# Patient Record
Sex: Female | Born: 1935 | Race: White | Hispanic: No | Smoking: Former smoker
Health system: Southern US, Community
[De-identification: ages and names within clinical notes are randomized; demographics above are authoritative.]

## PROBLEM LIST (undated history)

## (undated) DIAGNOSIS — H269 Unspecified cataract: Secondary | ICD-10-CM

## (undated) DIAGNOSIS — K219 Gastro-esophageal reflux disease without esophagitis: Secondary | ICD-10-CM

## (undated) DIAGNOSIS — C801 Malignant (primary) neoplasm, unspecified: Secondary | ICD-10-CM

## (undated) DIAGNOSIS — E119 Type 2 diabetes mellitus without complications: Secondary | ICD-10-CM

## (undated) DIAGNOSIS — T7840XA Allergy, unspecified, initial encounter: Secondary | ICD-10-CM

## (undated) DIAGNOSIS — R17 Unspecified jaundice: Secondary | ICD-10-CM

## (undated) DIAGNOSIS — I8390 Asymptomatic varicose veins of unspecified lower extremity: Secondary | ICD-10-CM

## (undated) DIAGNOSIS — H3530 Unspecified macular degeneration: Secondary | ICD-10-CM

## (undated) DIAGNOSIS — E7800 Pure hypercholesterolemia, unspecified: Secondary | ICD-10-CM

## (undated) HISTORY — DX: Unspecified jaundice: R17

## (undated) HISTORY — DX: Asymptomatic varicose veins of unspecified lower extremity: I83.90

## (undated) HISTORY — DX: Unspecified macular degeneration: H35.30

## (undated) HISTORY — DX: Gastro-esophageal reflux disease without esophagitis: K21.9

## (undated) HISTORY — PX: TUBAL LIGATION: SHX77

## (undated) HISTORY — DX: Unspecified cataract: H26.9

## (undated) HISTORY — DX: Allergy, unspecified, initial encounter: T78.40XA

## (undated) HISTORY — DX: Type 2 diabetes mellitus without complications: E11.9

---

## 1998-07-08 ENCOUNTER — Emergency Department (HOSPITAL_COMMUNITY): Admission: EM | Admit: 1998-07-08 | Discharge: 1998-07-08 | Payer: Self-pay

## 2015-03-27 ENCOUNTER — Emergency Department (HOSPITAL_COMMUNITY)
Admission: EM | Admit: 2015-03-27 | Discharge: 2015-03-27 | Disposition: A | Discharge status: Home / Self Care | Payer: Medicare HMO | Attending: Emergency Medicine | Admitting: Emergency Medicine

## 2015-03-27 ENCOUNTER — Encounter (HOSPITAL_COMMUNITY): Payer: Self-pay | Admitting: Emergency Medicine

## 2015-03-27 DIAGNOSIS — R630 Anorexia: Secondary | ICD-10-CM | POA: Diagnosis not present

## 2015-03-27 DIAGNOSIS — Z87891 Personal history of nicotine dependence: Secondary | ICD-10-CM | POA: Insufficient documentation

## 2015-03-27 DIAGNOSIS — L03115 Cellulitis of right lower limb: Secondary | ICD-10-CM | POA: Diagnosis not present

## 2015-03-27 DIAGNOSIS — R1031 Right lower quadrant pain: Secondary | ICD-10-CM | POA: Diagnosis present

## 2015-03-27 DIAGNOSIS — R3915 Urgency of urination: Secondary | ICD-10-CM | POA: Insufficient documentation

## 2015-03-27 DIAGNOSIS — L02415 Cutaneous abscess of right lower limb: Secondary | ICD-10-CM | POA: Diagnosis not present

## 2015-03-27 DIAGNOSIS — Z79899 Other long term (current) drug therapy: Secondary | ICD-10-CM | POA: Insufficient documentation

## 2015-03-27 DIAGNOSIS — L0390 Cellulitis, unspecified: Secondary | ICD-10-CM

## 2015-03-27 DIAGNOSIS — L0291 Cutaneous abscess, unspecified: Secondary | ICD-10-CM

## 2015-03-27 LAB — URINALYSIS, ROUTINE W REFLEX MICROSCOPIC
Bilirubin Urine: NEGATIVE
Glucose, UA: >1000 mg/dL — AB
Ketones, ur: NEGATIVE mg/dL
LEUKOCYTES UA: NEGATIVE
Nitrite: NEGATIVE
PROTEIN: NEGATIVE mg/dL
SPECIFIC GRAVITY, URINE: 1.035 — AB (ref 1.005–1.030)
UROBILINOGEN UA: 0.2 mg/dL (ref 0.0–1.0)
pH: 5 (ref 5.0–8.0)

## 2015-03-27 LAB — URINE MICROSCOPIC-ADD ON

## 2015-03-27 LAB — CBG MONITORING, ED: GLUCOSE-CAPILLARY: 239 mg/dL — AB (ref 65–99)

## 2015-03-27 MED ORDER — LIDOCAINE HCL (PF) 1 % IJ SOLN
10.0000 mL | Freq: Once | INTRAMUSCULAR | Status: AC
  Administered 2015-03-27: 10 mL via INTRADERMAL
  Filled 2015-03-27: qty 10

## 2015-03-27 MED ORDER — CEPHALEXIN 500 MG PO CAPS: 500.0000 mg | ORAL_CAPSULE | Freq: Two times a day (BID) | ORAL | Status: DC

## 2015-03-27 MED ORDER — ACETAMINOPHEN 500 MG PO TABS
1000.0000 mg | ORAL_TABLET | Freq: Once | ORAL | Status: DC
  Filled 2015-03-27: qty 2

## 2015-03-27 MED ORDER — OXYCODONE HCL 5 MG PO TABS
2.5000 mg | ORAL_TABLET | Freq: Once | ORAL | Status: AC
  Administered 2015-03-27: 2.5 mg via ORAL
  Filled 2015-03-27: qty 1

## 2015-03-27 NOTE — ED Notes (Addendum)
Patient states she has right groin pain.  Denies any injury or falls.  She states she hasn't done any exercise or activity to have caused this pain. She states a knot has appeared 2 days ago. She has trouble moving about and walking.  Patient states she has been having chills.

## 2015-03-27 NOTE — ED Notes (Signed)
Awake. Verbally responsive. A/O x4. Resp even and unlabored. No audible adventitious breath sounds noted. ABC's intact.  

## 2015-03-27 NOTE — Discharge Instructions (Signed)
1. Medications: keflex, usual home medications 2. Treatment: rest, drink plenty of fluids  3. Follow Up: please followup with your primary doctor for discussion of your diagnoses and further evaluation after today's visit; if you do not have a primary care doctor use the resource guide provided to find one; please return to the ER for severe pain, persistent bleeding from incision and drainage site, fever, chills, new or worsening symptoms   Abscess An abscess is an infected area that contains a collection of pus and debris.It can occur in almost any part of the body. An abscess is also known as a furuncle or boil. CAUSES  An abscess occurs when tissue gets infected. This can occur from blockage of oil or sweat glands, infection of hair follicles, or a minor injury to the skin. As the body tries to fight the infection, pus collects in the area and creates pressure under the skin. This pressure causes pain. People with weakened immune systems have difficulty fighting infections and get certain abscesses more often.  SYMPTOMS Usually an abscess develops on the skin and becomes a painful mass that is red, warm, and tender. If the abscess forms under the skin, you may feel a moveable soft area under the skin. Some abscesses break open (rupture) on their own, but most will continue to get worse without care. The infection can spread deeper into the body and eventually into the bloodstream, causing you to feel ill.  DIAGNOSIS  Your caregiver will take your medical history and perform a physical exam. A sample of fluid may also be taken from the abscess to determine what is causing your infection. TREATMENT  Your caregiver may prescribe antibiotic medicines to fight the infection. However, taking antibiotics alone usually does not cure an abscess. Your caregiver may need to make a small cut (incision) in the abscess to drain the pus. In some cases, gauze is packed into the abscess to reduce pain and to  continue draining the area. HOME CARE INSTRUCTIONS   Only take over-the-counter or prescription medicines for pain, discomfort, or fever as directed by your caregiver.  If you were prescribed antibiotics, take them as directed. Finish them even if you start to feel better.  If gauze is used, follow your caregiver's directions for changing the gauze.  To avoid spreading the infection:  Keep your draining abscess covered with a bandage.  Wash your hands well.  Do not share personal care items, towels, or whirlpools with others.  Avoid skin contact with others.  Keep your skin and clothes clean around the abscess.  Keep all follow-up appointments as directed by your caregiver. SEEK MEDICAL CARE IF:   You have increased pain, swelling, redness, fluid drainage, or bleeding.  You have muscle aches, chills, or a general ill feeling.  You have a fever. MAKE SURE YOU:   Understand these instructions.  Will watch your condition.  Will get help right away if you are not doing well or get worse. Document Released: 04/16/2005 Document Revised: 01/06/2012 Document Reviewed: 09/19/2011 [facility] Patient Information 2015 [place], [place]. This information is not intended to replace advice given to you by your health care provider. Make sure you discuss any questions you have with your health care provider.  Cellulitis Cellulitis is an infection of the skin and the tissue beneath it. The infected area is usually red and tender. Cellulitis occurs most often in the arms and lower legs.  CAUSES  Cellulitis is caused by bacteria that enter the skin through cracks  or cuts in the skin. The most common types of bacteria that cause cellulitis are staphylococci and streptococci. SIGNS AND SYMPTOMS   Redness and warmth.  Swelling.  Tenderness or pain.  Fever. DIAGNOSIS  Your health care provider can usually determine what is wrong based on a physical exam. Blood tests may also be  done. TREATMENT  Treatment usually involves taking an antibiotic medicine. HOME CARE INSTRUCTIONS   Take your antibiotic medicine as directed by your health care provider. Finish the antibiotic even if you start to feel better.  Keep the infected arm or leg elevated to reduce swelling.  Apply a warm cloth to the affected area up to 4 times per day to relieve pain.  Take medicines only as directed by your health care provider.  Keep all follow-up visits as directed by your health care provider. SEEK MEDICAL CARE IF:   You notice red streaks coming from the infected area.  Your red area gets larger or turns dark in color.  Your bone or joint underneath the infected area becomes painful after the skin has healed.  Your infection returns in the same area or another area.  You notice a swollen bump in the infected area.  You develop new symptoms.  You have a fever. SEEK IMMEDIATE MEDICAL CARE IF:   You feel very sleepy.  You develop vomiting or diarrhea.  You have a general ill feeling (malaise) with muscle aches and pains. MAKE SURE YOU:   Understand these instructions.  Will watch your condition.  Will get help right away if you are not doing well or get worse. Document Released: 04/16/2005 Document Revised: 11/21/2013 Document Reviewed: 09/22/2011 [facility] Patient Information 2015 [place], [place]. This information is not intended to replace advice given to you by your health care provider. Make sure you discuss any questions you have with your health care provider.   Emergency Department Resource Guide 1) Find a Doctor and Pay Out of Pocket Although you won't have to find out who is covered by your insurance plan, it is a good idea to ask around and get recommendations. You will then need to call the office and see if the doctor you have chosen will accept you as a new patient and what types of options they offer for patients who are self-pay. Some doctors offer  discounts or will set up payment plans for their patients who do not have insurance, but you will need to ask so you aren't surprised when you get to your appointment.  2) Contact Your Local Health Department Not all health departments have doctors that can see patients for sick visits, but many do, so it is worth a call to see if yours does. If you don't know where your local health department is, you can check in your phone book. The CDC also has a tool to help you locate your state's health department, and many state websites also have listings of all of their local health departments.  3) Find a [facility] If your illness is not likely to be very severe or complicated, you may want to try a walk in clinic. These are popping up all over the country in pharmacies, drugstores, and shopping centers. They're usually staffed by nurse practitioners or physician assistants that have been trained to treat common illnesses and complaints. They're usually fairly quick and inexpensive. However, if you have serious medical issues or chronic medical problems, these are probably not your best option.  No Primary Care Doctor: - Call  Health Connect at  [phone] - they can help you locate a primary care doctor that  accepts your insurance, provides certain services, etc. - Physician Referral Service- [phone]  Chronic Pain Problems: Organization         Address  Phone   Notes  [facility]  [phone] Patients need to be referred by their primary care doctor.   Medication Assistance: Organization         Address  Phone   Notes  [facility] Medication [facility] [place] [address] [place], [place] [address] [phone] --Must be a resident of [facility] -- Must have NO insurance coverage whatsoever (no Medicaid/ Medicare, etc.) -- The pt. MUST have a primary care doctor that directs their care regularly and follows them in the community   MedAssist   [phone]   [employer]  [phone]    Agencies that provide inexpensive medical care: Organization         Address  Phone   Notes  [place]  [phone]   [facility]    [phone]   [facility] [place], [place] [address] [phone]   [place] [address] [place] [phone]   Planned Parenthood    [phone]   [facility]    [phone]   [place] and [place] [address], [place] Phone:  [phone], Fax:  [phone] Hours of Operation:  9 am - 6 pm, M-F.  Also accepts Medicaid/Medicare and self-pay.  [facility] for [place], [address], [place] Phone: [phone], Fax: [phone]. Hours of Operation:  8:30 am - 5:30 pm, M-F.  Also accepts Medicaid and self-pay.  [facility] [place] [address] [place] Phone: [phone]   [place], [place], [place] [phone], Ext. [phone] Mondays & Thursdays: 7-9 AM.  First 15 patients are seen on a first come, first serve basis.    [place] Providers:  Organization         Address  Phone   Notes  [facility] [address], [place] [phone] Also accepts self-pay patients.  [facility] [address], [place]  [phone]   [place], [address], [place] [phone]   [facility] [address], [place] [phone]   Lucianne Lei [address], [place]   [phone] Only accepts [place] Access [place] patients after they have their name applied to their card.   Self-Pay (no insurance) in [facility]:  Organization         Address  Phone   Notes  Sickle Cell Patients, [facility] [place] [phone]   [facility] [place] [phone]   [facility] [place]  [place], [place], [place] [phone]   [facility]/Dr. Osei-Bonsu  [address], [place] or [address], [place] [phone] Phone number for both [place] and [place] locations is the same.  [facility] [address] [place] [phone]   [place]  [address], [place] or [address] [phone]   [facility] [place] [phone], phone; [phone], fax Sees patients 1st and 3rd Saturday of every month.  Must not qualify for public or private insurance (i.e. Medicaid, Medicare, [place] Health Choice, Veterans' Benefits)  Household income should be no more than 200% of the poverty level The clinic cannot treat you if you are pregnant or think you are pregnant  Sexually transmitted diseases are not treated at the clinic.    Dental Care: Organization         Address  Phone  Notes  [facility] of [facility] [place] [phone] Accepts children up to age 36 who are enrolled in [place] or [place]; pregnant women with a Medicaid card; and children who have applied for Medicaid or [place] Health Choice, but were declined, whose parents can pay a reduced fee at time of service.  [facility] Department of [facility]  [address], [place] [phone] Accepts children up to age 69 who are enrolled in [place] or [place]; pregnant women with a Medicaid card; and children who have applied for Medicaid or [place] Health Choice, but were declined, whose parents can pay a reduced fee at time of service.  [place] Adult Dental Access PROGRAM  [place]  [phone] Patients are seen by appointment only. Walk-ins are not accepted. [place] will see patients 84 years of age and older. Monday - Tuesday (8am-5pm) Most Wednesdays (8:30-5pm) $30 per visit, cash only  [facility] Adult Dental Access PROGRAM  [address], [facility] [phone] Patients are seen by appointment only. Walk-ins are not accepted. [place] will see patients 55 years of age and older. One Wednesday Evening (Monthly: Volunteer Based).  $30 per visit, cash only  [place]  [phone] for adults; Children under age 60, call Graduate Pediatric Dentistry at [phone]. Children aged 30-14, please call [phone] to request a pediatric application.  Dental services are provided in all areas of dental care including fillings, crowns and bridges, complete and partial dentures, implants, gum treatment, root canals, and extractions. Preventive care is also provided. Treatment is provided to both adults and children. Patients are selected via a lottery and there is often a waiting list.   [facility] [address] [place]  [phone] [email or web address]   [facility] [address] [place], [place] [phone], Ext. [phone] Second and Fourth Thursday of each month, opens at 6:30 AM; Clinic ends at 9 AM.  Patients are seen on a first-come first-served basis, and a limited number are seen during each clinic.   [facility]  [address] [place] Danker [place], [place] [phone]   Eligibility Requirements You must have lived in [place], [place], or [place] counties for at least the last three months.   You cannot be eligible for state or federal sponsored [employer], including [employer], [place], or [employer].   You generally cannot be eligible for healthcare insurance through your employer.    How to apply: Eligibility screenings are held every Tuesday and Wednesday  afternoon from 1:00 pm until 4:00 pm. You do not need an appointment for the interview!  [facility] [address] [place], [place]   [place]  [place] Department  [place]  [phone]    Behavioral Health Resources in the Community: Intensive Outpatient Programs Organization         Address  Phone  Notes  [place]. [address], [place], [place] [phone]   [facility]patient [address] [place], [place]   ADS: Alcohol & Drug Sv[facility] [address] [place], [place]   [place] [address]  [place], [place] or [phone]   Substance Abuse Resources Organization         Address  Phone  Notes  Alcohol and Drug Services  [phone]   [place]  [phone]   [facility] [place]   Chinita Pester  [phone]   Residential & Outpatient Substance Abuse Program  [phone]   Psychological Services Organization         Address  Phone  Notes  [facility] [place]  [place]  [phone]   [place] [address], [place] or [phone]    Mobile Crisis Teams Organization         Address  Phone  Notes  [facility], Mobile Crisis Care Unit  [phone]   [facility]  [address] [place], [place]   [place] [address], [place] [phone]    Self-Help/Support Groups Organization         Address  Phone             Notes  [place]. of [place] - variety of support groups  [place] Call for more information  Narcotics Anonymous (NA), [facility] [address], [employer] [place]  2 meetings at this location   Materials engineer         Address  Phone  Notes  [facility]idential [facility] [place],    [place]  [phone]   [facility]  [address] [place] [address], [place], [place]   [place], [place] [phone] Admissions: 8am-3pm M-F  Incentives Substance [place] [address]    [place], [place] [phone]   [facility] [address] [place], [place], [place]   [facility] [address],  [place], [place]   [facility]ight Programs - Intensive Outpatient [address], [place], [place]   [facility] ([place] [place].,  [place], [place] [phone] or [phone]   [facility] (RTS) [address], [place], [place] Accepts Medicaid  [facility] [place] [address]  [place] [phone] Substance Abuse/Addiction Treatment   [facility] Organization         Address  Phone  Notes  [facility]  [phone]   Domenic Schwab, PhD [address] Arlis [place], [place]   [phone] or [phone]   [facility] [place], [place] [phone]   [place] [address] [place], [place] [phone] Insurance/Medicaid/sponsorship through [employer] and Families [address], [address]  [place], [place] [phone] [place], [place] [phone]    Dr. Adele Schilder  [phone]   [facility] Dept. 1) [address] [place] 2) [place] 3)  [place], [place] [phone]  [phone]   [place] [phone] or [phone] (After Hours)

## 2015-03-27 NOTE — ED Provider Notes (Signed)
CSN: [id number]     Arrival date & time 03/27/15  1238 History   First MD Initiated Contact with Patient 03/27/15 1502     Chief Complaint  Patient presents with  . Groin Pain     HPI   Barbara Golden is a 79 y.o. female with no pertinent PMH who presents to the ED with right groin pain since Saturday. She states she noticed a "knot" in her right groin on Saturday, and reports worsening pain and swelling over the last several days. She states movement exacerbates her pain. She has not tried anything for symptom relief. Denies drainage. Reports subjective fever and chills. Denies headache, dizziness, syncope, chest pain, shortness of breath, abdominal pain, N/V/D/C, dysuria. Reports urgency. Denies vaginal discharge. States this has never happened to her before.   History reviewed. No pertinent past medical history. Past Surgical History  Procedure Laterality Date  . Tubal ligation Bilateral    No family history on file. Social History  Substance Use Topics  . Smoking status: Former Smoker -- 0.50 packs/day    Types: Cigarettes    Quit date: 03/26/2009  . Smokeless tobacco: Never Used  . Alcohol Use: No   OB History    No data available      Review of Systems  Constitutional: Positive for fever, chills and appetite change. Negative for activity change and fatigue.       Reports subjective fever, chills; reports decreased appetite.  HENT: Negative for congestion.   Eyes: Negative for visual disturbance.  Respiratory: Negative for cough and shortness of breath.   Cardiovascular: Negative for chest pain, palpitations and leg swelling.  Gastrointestinal: Negative for nausea, vomiting, abdominal pain, diarrhea, constipation and abdominal distention.  Genitourinary: Positive for urgency. Negative for dysuria and frequency.  Musculoskeletal: Negative for myalgias, back pain, arthralgias, neck pain and neck stiffness.  Skin: Negative for color change, pallor, rash and wound.   Neurological: Negative for dizziness, syncope, weakness, light-headedness, numbness and headaches.  All other systems reviewed and are negative.     Allergies  Review of patient's allergies indicates no known allergies.  Home Medications   Prior to Admission medications   Medication Sig Start Date End Date Taking? Authorizing Provider  Alum Hydroxide-Mag Carbonate (GAVISCON PO) Take 1 tablet by mouth daily as needed (heartburn).   Yes Historical Provider, MD  cetirizine (ZYRTEC) 10 MG tablet Take 10 mg by mouth daily as needed for allergies.   Yes Historical Provider, MD  Multiple Vitamins-Minerals (PRESERVISION AREDS PO) Take 2 capsules by mouth daily.   Yes Historical Provider, MD  ranitidine (ZANTAC) 150 MG tablet Take 150 mg by mouth daily as needed for heartburn.   Yes Historical Provider, MD    BP 109/71 mmHg  Pulse 99  Temp(Src) 98.4 F (36.9 C) (Oral)  Resp 16  SpO2 97% Physical Exam  Constitutional: She is oriented to person, place, and time. She appears well-developed and well-nourished. No distress.  HENT:  Head: Normocephalic and atraumatic.  Right Ear: External ear normal.  Left Ear: External ear normal.  Nose: Nose normal.  Mouth/Throat: Uvula is midline, oropharynx is clear and moist and mucous membranes are normal.  Eyes: Conjunctivae, EOM and lids are normal. Pupils are equal, round, and reactive to light. Right eye exhibits no discharge. Left eye exhibits no discharge. No scleral icterus.  Neck: Normal range of motion. Neck supple.  Cardiovascular: Normal rate, regular rhythm, normal heart sounds, intact distal pulses and normal pulses.   Pulmonary/Chest: Effort  normal and breath sounds normal. No respiratory distress. She has no wheezes. She has no rales. She exhibits no tenderness.  Abdominal: Soft. Normal appearance and bowel sounds are normal. She exhibits no distension and no mass. There is no tenderness. There is no rigidity, no rebound and no guarding.   Musculoskeletal: Normal range of motion. She exhibits no edema or tenderness.  Neurological: She is alert and oriented to person, place, and time. She has normal strength. No sensory deficit.  Skin: Skin is warm, dry and intact. No rash noted. She is not diaphoretic. There is erythema. No pallor.  1 cm fluctuant mass with surrounding induration, erythema, and warmth to medial aspect of right upper thigh.  Psychiatric: She has a normal mood and affect. Her speech is normal and behavior is normal. Judgment and thought content normal.  Nursing note and vitals reviewed.   ED Course  INCISION AND DRAINAGE Date/Time: 03/27/2015 4:39 PM Performed by: Bernerd Limbo C Authorized by: Bernerd Limbo C Consent: Verbal consent obtained. Risks and benefits: risks, benefits and alternatives were discussed Consent given by: patient Patient understanding: patient states understanding of the procedure being performed Patient consent: the patient's understanding of the procedure matches consent given Procedure consent: procedure consent matches procedure scheduled Relevant documents: relevant documents present and verified Site marked: the operative site was marked Imaging studies: imaging studies available Required items: required blood products, implants, devices, and special equipment available Patient identity confirmed: verbally with patient and arm band Time out: Immediately prior to procedure a "time out" was called to verify the correct patient, procedure, equipment, support staff and site/side marked as required. Type: abscess Body area: lower extremity Location details: right leg Anesthesia: local infiltration Local anesthetic: lidocaine 1% without epinephrine Anesthetic total: 8 ml Patient sedated: no Scalpel size: 11 Needle gauge: 22 Incision type: single straight Incision depth: dermal Complexity: simple Drainage: serosanguinous and  purulent Drainage amount:  moderate Wound treatment: wound left open Packing material: none Patient tolerance: Patient tolerated the procedure well with no immediate complications   (including critical care time)  Labs Review Labs Reviewed  URINALYSIS, ROUTINE W REFLEX MICROSCOPIC (NOT AT [facility]) - Abnormal; Notable for the following:    Specific Gravity, Urine 1.035 (*)    Glucose, UA >1000 (*)    Hgb urine dipstick TRACE (*)    All other components within normal limits  URINE MICROSCOPIC-ADD ON - Abnormal; Notable for the following:    Squamous Epithelial / LPF FEW (*)    All other components within normal limits  CBG MONITORING, ED - Abnormal; Notable for the following:    Glucose-Capillary 239 (*)    All other components within normal limits    Imaging Review No results found.   I have personally reviewed and evaluated these lab results as part of my medical decision-making.   EKG Interpretation None      MDM   Final diagnoses:  Abscess and cellulitis   79 year old female presents with abscess to medial aspect of right upper thigh since Saturday.  Reports subjective fever and chills. Denies headache, dizziness, syncope, chest pain, shortness of breath, abdominal pain, N/V/D/C, dysuria. Reports urgency. Denies vaginal discharge. States this has never happened to her before.  Patient is afebrile. Vital signs stable. 1 cm fluctuant mass with surrounding induration, erythema, and warmth to medial aspect of right upper thigh.   Will obtain UA given urgency as well as blood glucose to assess for possible DM. Patient reports she rarely goes to  the doctor and does not have a personal history of DM, but has a FH of DM. UA negative for infection, urine glucose >1000. CBG 239.  Pain controlled in the ED. I&D performed, will discharge patient with keflex. Patient to follow-up with PCP for wound re-check and for discussion of elevated blood glucose. Return precautions discussed.  BP 109/71 mmHg  Pulse 99   Temp(Src) 98.4 F (36.9 C) (Oral)  Resp 16  SpO2 97%      Marella Chimes, PA-C 03/28/15 Maud, DO 03/28/15 [phone]

## 2015-03-27 NOTE — ED Notes (Signed)
Pt states "it started hurting hurting Saturday.  I've been having fever and chills."

## 2017-03-05 DIAGNOSIS — R030 Elevated blood-pressure reading, without diagnosis of hypertension: Secondary | ICD-10-CM | POA: Diagnosis not present

## 2017-03-05 DIAGNOSIS — J069 Acute upper respiratory infection, unspecified: Secondary | ICD-10-CM | POA: Diagnosis not present

## 2017-03-05 DIAGNOSIS — R05 Cough: Secondary | ICD-10-CM | POA: Diagnosis not present

## 2018-05-10 DIAGNOSIS — H524 Presbyopia: Secondary | ICD-10-CM | POA: Diagnosis not present

## 2019-06-10 DIAGNOSIS — R69 Illness, unspecified: Secondary | ICD-10-CM | POA: Diagnosis not present

## 2019-06-15 ENCOUNTER — Other Ambulatory Visit: Payer: Self-pay

## 2019-06-15 DIAGNOSIS — Z20822 Contact with and (suspected) exposure to covid-19: Secondary | ICD-10-CM

## 2019-06-16 LAB — NOVEL CORONAVIRUS, NAA: SARS-CoV-2, NAA: NOT DETECTED

## 2019-06-21 DIAGNOSIS — J309 Allergic rhinitis, unspecified: Secondary | ICD-10-CM | POA: Diagnosis not present

## 2019-06-21 DIAGNOSIS — Z6826 Body mass index (BMI) 26.0-26.9, adult: Secondary | ICD-10-CM | POA: Diagnosis not present

## 2019-06-21 DIAGNOSIS — Z809 Family history of malignant neoplasm, unspecified: Secondary | ICD-10-CM | POA: Diagnosis not present

## 2019-06-21 DIAGNOSIS — Z7722 Contact with and (suspected) exposure to environmental tobacco smoke (acute) (chronic): Secondary | ICD-10-CM | POA: Diagnosis not present

## 2019-06-21 DIAGNOSIS — E663 Overweight: Secondary | ICD-10-CM | POA: Diagnosis not present

## 2019-06-21 DIAGNOSIS — G3184 Mild cognitive impairment, so stated: Secondary | ICD-10-CM | POA: Diagnosis not present

## 2019-06-21 DIAGNOSIS — K08109 Complete loss of teeth, unspecified cause, unspecified class: Secondary | ICD-10-CM | POA: Diagnosis not present

## 2019-06-21 DIAGNOSIS — H5360 Unspecified night blindness: Secondary | ICD-10-CM | POA: Diagnosis not present

## 2019-06-21 DIAGNOSIS — R269 Unspecified abnormalities of gait and mobility: Secondary | ICD-10-CM | POA: Diagnosis not present

## 2019-06-21 DIAGNOSIS — H3530 Unspecified macular degeneration: Secondary | ICD-10-CM | POA: Diagnosis not present

## 2019-09-13 ENCOUNTER — Emergency Department (HOSPITAL_COMMUNITY): Payer: Medicare HMO

## 2019-09-13 ENCOUNTER — Ambulatory Visit (INDEPENDENT_AMBULATORY_CARE_PROVIDER_SITE_OTHER)
Admission: EM | Admit: 2019-09-13 | Discharge: 2019-09-13 | Disposition: A | Discharge status: Home / Self Care | Payer: Medicare HMO | Source: Home / Self Care

## 2019-09-13 ENCOUNTER — Observation Stay (HOSPITAL_COMMUNITY)
Admission: EM | Admit: 2019-09-13 | Discharge: 2019-09-14 | Disposition: A | Discharge status: Home / Self Care | Payer: Medicare HMO | Attending: Internal Medicine | Admitting: Internal Medicine

## 2019-09-13 ENCOUNTER — Encounter (HOSPITAL_COMMUNITY): Payer: Self-pay | Admitting: Emergency Medicine

## 2019-09-13 ENCOUNTER — Other Ambulatory Visit: Payer: Self-pay

## 2019-09-13 ENCOUNTER — Encounter: Payer: Self-pay | Admitting: Emergency Medicine

## 2019-09-13 DIAGNOSIS — E860 Dehydration: Secondary | ICD-10-CM | POA: Insufficient documentation

## 2019-09-13 DIAGNOSIS — Z20822 Contact with and (suspected) exposure to covid-19: Secondary | ICD-10-CM | POA: Insufficient documentation

## 2019-09-13 DIAGNOSIS — E1165 Type 2 diabetes mellitus with hyperglycemia: Principal | ICD-10-CM | POA: Insufficient documentation

## 2019-09-13 DIAGNOSIS — R410 Disorientation, unspecified: Secondary | ICD-10-CM

## 2019-09-13 DIAGNOSIS — Z9119 Patient's noncompliance with other medical treatment and regimen: Secondary | ICD-10-CM

## 2019-09-13 DIAGNOSIS — J302 Other seasonal allergic rhinitis: Secondary | ICD-10-CM | POA: Insufficient documentation

## 2019-09-13 DIAGNOSIS — Z87891 Personal history of nicotine dependence: Secondary | ICD-10-CM | POA: Insufficient documentation

## 2019-09-13 DIAGNOSIS — R2681 Unsteadiness on feet: Secondary | ICD-10-CM | POA: Diagnosis not present

## 2019-09-13 DIAGNOSIS — Z885 Allergy status to narcotic agent status: Secondary | ICD-10-CM | POA: Diagnosis not present

## 2019-09-13 DIAGNOSIS — R5383 Other fatigue: Secondary | ICD-10-CM

## 2019-09-13 DIAGNOSIS — N390 Urinary tract infection, site not specified: Secondary | ICD-10-CM | POA: Diagnosis not present

## 2019-09-13 DIAGNOSIS — W19XXXA Unspecified fall, initial encounter: Secondary | ICD-10-CM | POA: Diagnosis not present

## 2019-09-13 DIAGNOSIS — R8271 Bacteriuria: Secondary | ICD-10-CM | POA: Insufficient documentation

## 2019-09-13 DIAGNOSIS — Z833 Family history of diabetes mellitus: Secondary | ICD-10-CM | POA: Insufficient documentation

## 2019-09-13 DIAGNOSIS — R531 Weakness: Secondary | ICD-10-CM | POA: Diagnosis not present

## 2019-09-13 DIAGNOSIS — R739 Hyperglycemia, unspecified: Secondary | ICD-10-CM | POA: Diagnosis not present

## 2019-09-13 DIAGNOSIS — R631 Polydipsia: Secondary | ICD-10-CM | POA: Diagnosis not present

## 2019-09-13 DIAGNOSIS — S0990XA Unspecified injury of head, initial encounter: Secondary | ICD-10-CM | POA: Insufficient documentation

## 2019-09-13 DIAGNOSIS — Z91199 Patient's noncompliance with other medical treatment and regimen due to unspecified reason: Secondary | ICD-10-CM

## 2019-09-13 DIAGNOSIS — R5381 Other malaise: Secondary | ICD-10-CM

## 2019-09-13 LAB — BASIC METABOLIC PANEL
Anion gap: 11 (ref 5–15)
BUN: 9 mg/dL (ref 8–23)
CO2: 27 mmol/L (ref 22–32)
Calcium: 9.2 mg/dL (ref 8.9–10.3)
Chloride: 101 mmol/L (ref 98–111)
Creatinine, Ser: 0.64 mg/dL (ref 0.44–1.00)
GFR calc Af Amer: >60 mL/min (ref >60)
GFR calc non Af Amer: >60 mL/min (ref >60)
Glucose, Bld: 306 mg/dL — ABNORMAL HIGH (ref 70–99)
Potassium: 4.2 mmol/L (ref 3.5–5.1)
Sodium: 139 mmol/L (ref 135–145)

## 2019-09-13 LAB — CBG MONITORING, ED
Glucose-Capillary: 269 mg/dL — ABNORMAL HIGH (ref 70–99)
Glucose-Capillary: 190 mg/dL — ABNORMAL HIGH (ref 70–99)
Glucose-Capillary: 234 mg/dL — ABNORMAL HIGH (ref 70–99)

## 2019-09-13 LAB — HEMOGLOBIN A1C
Hgb A1c MFr Bld: 13 % — ABNORMAL HIGH (ref 4.8–5.6)
Mean Plasma Glucose: 326.4 mg/dL

## 2019-09-13 LAB — URINALYSIS, ROUTINE W REFLEX MICROSCOPIC
Bilirubin Urine: NEGATIVE
Glucose, UA: >=500 mg/dL — AB
Hgb urine dipstick: NEGATIVE
Ketones, ur: 5 mg/dL — AB
Nitrite: NEGATIVE
Protein, ur: NEGATIVE mg/dL
Specific Gravity, Urine: 1.031 — ABNORMAL HIGH (ref 1.005–1.030)
WBC, UA: >50 WBC/hpf — ABNORMAL HIGH (ref 0–5)
pH: 5 (ref 5.0–8.0)

## 2019-09-13 LAB — CBC
HCT: 47.6 % — ABNORMAL HIGH (ref 36.0–46.0)
Hemoglobin: 15.3 g/dL — ABNORMAL HIGH (ref 12.0–15.0)
MCH: 30.4 pg (ref 26.0–34.0)
MCHC: 32.1 g/dL (ref 30.0–36.0)
MCV: 94.6 fL (ref 80.0–100.0)
Platelets: 372 10*3/uL (ref 150–400)
RBC: 5.03 MIL/uL (ref 3.87–5.11)
RDW: 12.5 % (ref 11.5–15.5)
WBC: 8.9 10*3/uL (ref 4.0–10.5)
nRBC: 0 % (ref 0.0–0.2)

## 2019-09-13 LAB — POCT FASTING CBG KUC MANUAL ENTRY: POCT Glucose (KUC): 315 mg/dL — AB (ref 70–99)

## 2019-09-13 MED ORDER — FAMOTIDINE 20 MG PO TABS: 10.0000 mg | ORAL_TABLET | Freq: Two times a day (BID) | ORAL | Status: DC | PRN

## 2019-09-13 MED ORDER — ONDANSETRON HCL 4 MG/2ML IJ SOLN
4.0000 mg | Freq: Once | INTRAMUSCULAR | Status: DC
  Filled 2019-09-13: qty 2

## 2019-09-13 MED ORDER — SODIUM CHLORIDE 0.9 % IV SOLN
1.0000 g | Freq: Once | INTRAVENOUS | Status: AC
  Administered 2019-09-13: 1 g via INTRAVENOUS
  Filled 2019-09-13: qty 10

## 2019-09-13 MED ORDER — LACTATED RINGERS IV SOLN: INTRAVENOUS | Status: AC

## 2019-09-13 MED ORDER — LORATADINE 10 MG PO TABS
10.0000 mg | ORAL_TABLET | Freq: Every day | ORAL | Status: DC
  Administered 2019-09-14: 10 mg via ORAL
  Filled 2019-09-13: qty 1

## 2019-09-13 MED ORDER — INSULIN ASPART 100 UNIT/ML ~~LOC~~ SOLN
0.0000 [IU] | Freq: Three times a day (TID) | SUBCUTANEOUS | Status: DC
  Administered 2019-09-14 (×2): 2 [IU] via SUBCUTANEOUS

## 2019-09-13 MED ORDER — ENOXAPARIN SODIUM 40 MG/0.4ML ~~LOC~~ SOLN: 40.0000 mg | SUBCUTANEOUS | Status: DC

## 2019-09-13 MED ORDER — SODIUM CHLORIDE 0.9 % IV BOLUS
500.0000 mL | Freq: Once | INTRAVENOUS | Status: AC
  Administered 2019-09-14: 500 mL via INTRAVENOUS

## 2019-09-13 MED ORDER — SODIUM CHLORIDE 0.9 % IV BOLUS
500.0000 mL | Freq: Once | INTRAVENOUS | Status: AC
  Administered 2019-09-13: 19:00:00 500 mL via INTRAVENOUS

## 2019-09-13 MED ORDER — INSULIN ASPART 100 UNIT/ML ~~LOC~~ SOLN
0.0000 [IU] | Freq: Every day | SUBCUTANEOUS | Status: DC
  Administered 2019-09-13: 23:00:00 3 [IU] via SUBCUTANEOUS

## 2019-09-13 MED ORDER — ACETAMINOPHEN 650 MG RE SUPP: 650.0000 mg | Freq: Four times a day (QID) | RECTAL | Status: DC | PRN

## 2019-09-13 MED ORDER — ACETAMINOPHEN 325 MG PO TABS: 650.0000 mg | ORAL_TABLET | Freq: Four times a day (QID) | ORAL | Status: DC | PRN

## 2019-09-13 NOTE — ED Provider Notes (Signed)
[facility] EMERGENCY DEPARTMENT Provider Note   CSN: [id number] Arrival date & time: 09/13/19  1406  History Chief Complaint  Patient presents with  . Weakness  . Hyperglycemia   Barbara Golden is a 84 y.o. female with no significant past medical history who presents for evaluation of elevated blood sugars.  Patient states she got her Covid vaccine on 09/01/2019.  Patient states on the 22nd she had a fall.  She denies hitting her head, LOC or anticoagulation.  She called EMS to pick her up off the floor.  At that time they noted her to have an elevated blood sugar in the 400s.  Patient denies any prior history of hyperglycemia.  States her son is a type II diabetic and they have been checking her blood sugars at home.  Her blood sugars have been running in the high 100s-300s.  Patient states this morning she checked her blood sugar and it was 450.  Patient states she felt generally unwell at that time however she ate breakfast and this improved.  She denied any headache, dizziness, lightheadedness, neck pain, neck stiffness, facial droop, unilateral weakness, difficulty with word finding, chest pain, shortness of breath, weakness, abdominal pain, diarrhea, dysuria, rashes or lesions.  Patient states her family in the house has had some vomiting and diarrhea however she has not had anything.  Her last bowel movement was this morning which was "normal" without any melena or bright red blood per rectum.  She was able to keep down a full breakfast without any difficulty.  Patient states she is here to "get some medicine for my blood sugars."  Previously did not have a PCP however she schedule an appointment last week and has an appointment on March 18 to establish care with a PCP.  Denies additional aggravating or alleviating factors. No polyuria and polydipsia.  History obtained from patient and past medical records.  No interpreter is used.  Collateral information from Daughter. Reina Fuse at [phone] patient lives with.  States patient has been complaining of generalized weakness.  Sleeping all day the last 2 days. Some mild confusion. She had a fall 2 weeks ago and hit her head, per daughter however patient denies hitting head. Fall unwitnessed.  Per daughter patient has had malodorous urine and has been urinating more frequently.  Patient has not had a full appetite and persistently nauseous. Patient also complaining of abd pain to suprapubic region.  HPI     History reviewed. No pertinent past medical history.  There are no problems to display for this patient.   Past Surgical History:  Procedure Laterality Date  . TUBAL LIGATION Bilateral      OB History   No obstetric history on file.     History reviewed. No pertinent family history.  Social History   Tobacco Use  . Smoking status: Former Smoker    Packs/day: 0.50    Types: Cigarettes    Quit date: 03/26/2009    Years since quitting: 10.4  . Smokeless tobacco: Never Used  Substance Use Topics  . Alcohol use: No  . Drug use: No    Home Medications Prior to Admission medications   Medication Sig Start Date End Date Taking? Authorizing Provider  famotidine (PEPCID) 10 MG tablet Take 10 mg by mouth 2 (two) times daily as needed for heartburn or indigestion.   Yes [provider]  fexofenadine (ALLEGRA) 60 MG tablet Take 60 mg by mouth daily.   Yes [provider]  Multiple Vitamins-Minerals (ICAPS AREDS 2 PO) Take 1 tablet by mouth in the morning and at bedtime.   Yes [provider]  cetirizine (ZYRTEC) 10 MG tablet Take 10 mg by mouth daily as needed for allergies.  09/13/19  [provider]  ranitidine (ZANTAC) 150 MG tablet Take 150 mg by mouth daily as needed for heartburn.  09/13/19  [provider]    Allergies    Codeine  Review of Systems   Review of Systems  Constitutional: Negative.   HENT: Negative.   Eyes: Negative.   Respiratory:  Negative.   Cardiovascular: Negative.   Gastrointestinal: Negative.   Genitourinary: Negative.   Musculoskeletal: Negative.   Skin: Negative.   Neurological: Negative.   All other systems reviewed and are negative.   Physical Exam Updated Vital Signs BP (!) 156/70   Pulse 70   Temp 98.3 F (36.8 C) (Oral)   Resp 16   Ht 5\' 5"  (1.651 m)   Wt 72.6 kg   SpO2 97%   BMI 26.63 kg/m   Physical Exam Vitals and nursing note reviewed.  Constitutional:      General: She is not in acute distress.    Appearance: She is well-developed. She is not ill-appearing, toxic-appearing or diaphoretic.  HENT:     Head: Normocephalic and atraumatic.     Nose: Nose normal.     Mouth/Throat:     Mouth: Mucous membranes are moist.     Pharynx: Oropharynx is clear.  Eyes:     Pupils: Pupils are equal, round, and reactive to light.  Cardiovascular:     Rate and Rhythm: Normal rate.     Pulses: Normal pulses.     Heart sounds: Normal heart sounds.  Pulmonary:     Effort: Pulmonary effort is normal. No respiratory distress.     Breath sounds: Normal breath sounds.     Comments: Speaking in full sentences without difficulty. Abdominal:     General: Bowel sounds are normal. There is no distension.     Comments: Soft, non tender without rebound or guarding.  Musculoskeletal:        General: Normal range of motion.     Cervical back: Normal range of motion.     Comments: Moves all 4 extremities without difficulty.  Skin:    General: Skin is warm and dry.     Capillary Refill: Capillary refill takes less than 2 seconds.     Comments: Brisk cap refill  Neurological:     Mental Status: She is alert.     Comments: CN 2-12 grossly intake. Negative finger to nose, romberg, heel to shin. Ambulatory in room without difficulty.    ED Results / Procedures / Treatments   Labs (all labs ordered are listed, but only abnormal results are displayed) Labs Reviewed  BASIC METABOLIC PANEL - Abnormal;  Notable for the following components:      Result Value   Glucose, Bld 306 (*)    All other components within normal limits  CBC - Abnormal; Notable for the following components:   Hemoglobin 15.3 (*)    HCT 47.6 (*)    All other components within normal limits  URINALYSIS, ROUTINE W REFLEX MICROSCOPIC - Abnormal; Notable for the following components:   APPearance HAZY (*)    Specific Gravity, Urine 1.031 (*)    Glucose, UA >=500 (*)    Ketones, ur 5 (*)    Leukocytes,Ua LARGE (*)    WBC, UA >  50 (*)    Bacteria, UA FEW (*)    Non Squamous Epithelial 0-5 (*)    All other components within normal limits  HEMOGLOBIN A1C - Abnormal; Notable for the following components:   Hgb A1c MFr Bld 13.0 (*)    All other components within normal limits  CBG MONITORING, ED - Abnormal; Notable for the following components:   Glucose-Capillary 269 (*)    All other components within normal limits  CBG MONITORING, ED - Abnormal; Notable for the following components:   Glucose-Capillary 190 (*)    All other components within normal limits  SARS CORONAVIRUS 2 (TAT 6-24 HRS)  URINE CULTURE  CBG MONITORING, ED    EKG None  Radiology CT Head Wo Contrast  Result Date: 09/13/2019 CLINICAL DATA:  Head trauma, fall 2 weeks ago EXAM: CT HEAD WITHOUT CONTRAST TECHNIQUE: Contiguous axial images were obtained from the base of the skull through the vertex without intravenous contrast. COMPARISON:  None. FINDINGS: Brain: No evidence of acute territorial infarction, hemorrhage, hydrocephalus,extra-axial collection or mass lesion/mass effect. There is dilatation the ventricles and sulci consistent with age-related atrophy. Low-attenuation changes in the deep white matter consistent with small vessel ischemia. Vascular: No hyperdense vessel or unexpected calcification. Skull: The skull is intact. No fracture or focal lesion identified. Sinuses/Orbits: The visualized paranasal sinuses and mastoid air cells are clear.  The orbits and globes intact. Other: None IMPRESSION: No acute intracranial abnormality. Findings consistent with age related atrophy and chronic small vessel ischemia Electronically Signed   By: Prudencio Pair M.D.   On: 09/13/2019 21:11    Procedures Procedures (including critical care time)  Medications Ordered in ED Medications  cefTRIAXone (ROCEPHIN) 1 g in sodium chloride 0.9 % 100 mL IVPB (1 g Intravenous New Bag/Given 09/13/19 2119)  ondansetron (ZOFRAN) injection 4 mg (4 mg Intravenous Not Given 09/13/19 2135)  sodium chloride 0.9 % bolus 500 mL (500 mLs Intravenous New Bag/Given 09/13/19 1928)    ED Course  I have reviewed the triage vital signs and the nursing notes.  Pertinent labs & imaging results that were available during my care of the patient were reviewed by me and considered in my medical decision making (see chart for details).  84 year old appears otherwise well presents for evaluation of elevated blood sugars at home.  No prior history of diabetes.  Patient is afebrile, nonseptic, non-ill-appearing.  Patient is a nonfocal neuro exam without deficits.  She denies any polyuria polydipsia.  Took her blood sugar this morning which was in the 400s.  Patient states she is able to eat and felt improvement in her symptoms.  She denied any chest pain, shortness of breath, weakness or any focal changes at that time.  Abdomen soft, mild suprapubic tenderness.  Clinical Course as of Sep 13 2143  Tue Sep 13, 2019  2057 No leukocytosis, hemoglobin 15.3  CBC(!) [BH]  2057 Hyperglycemia to 306 however no gap.  No evidence of DKA  Basic metabolic panel(!) [BH]  [id number] CBG trending down to 190 with fluids  CBG monitoring, ED(!) [BH]  2057 Positive for infection will culture  Urinalysis, Routine w reflex microscopic(!) [BH]  2057 Elevated at 13  Hemoglobin A1c(!) [BH]  2118 Negative for acute intracranial pathology  CT Head Wo Contrast [BH]    Clinical Course User Index [BH]  Jhanvi Drakeford A, PA-C   Patient reassessed.  Discussed results with daughter.  Given patient has UTI, seems a little bit more confused than normal, new  onset diabetes will admit to hospitalist for some continued fluids, antibiotics.  Plan with patient she is agreeable to stay.  CONSULT with York Cerise with IM teaching service who agrees to evaluate patient for admission.  The patient appears reasonably stabilized for admission considering the current resources, flow, and capabilities available in the ED at this time, and I doubt any other [facility] requiring further screening and/or treatment in the ED prior to admission.  MDM Rules/Calculators/A&P                      Final Clinical Impression(s) / ED Diagnoses Final diagnoses:  Lower urinary tract infectious disease  Hyperglycemia  Confusion  Fall, initial encounter    Rx / DC Orders ED Discharge Orders    None       Olander Friedl A, PA-C 09/13/19 2145    Margette Fast, MD 09/14/19 1436

## 2019-09-13 NOTE — ED Triage Notes (Signed)
Pt arrives to ED from [facility] with complaints of weakness and shakiness starting when she woke up this morning. States her blood sugars have been elevated. Patient states earlier this week she had vomiting and diarrhea.

## 2019-09-13 NOTE — ED Notes (Addendum)
Per daughter on phone, pt had fall two weeks ago and was evaluated by EMS, decided not to come to the hospital, did not remember what happened when she fell and sugar was in the 400's at the time, daughter reports strong "sugar smell" on patient recently as well as foul-smelling stools and occassional blurred vision

## 2019-09-13 NOTE — ED Provider Notes (Signed)
[facility]    CSN: [id number] Arrival date & time: 09/13/19  1246      History   Chief Complaint Chief Complaint  Patient presents with  . Hyperglycemia    HPI Barbara Golden is a 84 y.o. female presenting for multiple concerns.  States she felt hungry, weak, shaky earlier today.  Checked her blood sugar: 284.  Patient denies history of diabetes, though has not seen of primary care "in years ".  Patient states that after eating she did feel better, then she had a large bowel movement.  Overall history is scattered per patient: Denies chest pain, difficulty breathing in office today. This provider went up to daughter's car for collateral: Daughter is largely concerned as for the last few weeks patient has been lethargic, had a decreased appetite, forgetting things, a gray-colored bowel movement this morning, some vomiting (without bile or blood, last episode a few days ago), and having a "sick, sweet smell to her".  Confirms that patient has between primary care's; is unsure when appointment establish care is.  No known fever, cough, chest pain or shortness of breath.    History reviewed. No pertinent past medical history.  There are no problems to display for this patient.   Past Surgical History:  Procedure Laterality Date  . TUBAL LIGATION Bilateral     OB History   No obstetric history on file.      Home Medications    Prior to Admission medications   Medication Sig Start Date End Date Taking? Authorizing Provider  famotidine (PEPCID) 10 MG tablet Take 10 mg by mouth 2 (two) times daily as needed for heartburn or indigestion.   Yes [provider]  fexofenadine (ALLEGRA) 60 MG tablet Take 60 mg by mouth daily.   Yes [provider]  Alum Hydroxide-Mag Carbonate (GAVISCON PO) Take 1 tablet by mouth daily as needed (heartburn).    [provider]  cetirizine (ZYRTEC) 10 MG tablet Take 10 mg by mouth daily as needed for allergies.   09/13/19  [provider]  ranitidine (ZANTAC) 150 MG tablet Take 150 mg by mouth daily as needed for heartburn.  09/13/19  [provider]    Family History History reviewed. No pertinent family history.  Social History Social History   Tobacco Use  . Smoking status: Former Smoker    Packs/day: 0.50    Types: Cigarettes    Quit date: 03/26/2009    Years since quitting: 10.4  . Smokeless tobacco: Never Used  Substance Use Topics  . Alcohol use: No  . Drug use: No     Allergies   Codeine   Review of Systems As per HPI   Physical Exam Triage Vital Signs ED Triage Vitals  Enc Vitals Group     BP      Pulse      Resp      Temp      Temp src      SpO2      Weight      Height      Head Circumference      Peak Flow      Pain Score      Pain Loc      Pain Edu?      Excl. in [place]?    No data found.  Updated Vital Signs BP (!) 151/81 (BP Location: Left Arm)   Pulse 84   Temp 98.4 F (36.9 C) (Temporal)   Resp 16  SpO2 96%   Visual Acuity Right Eye Distance:   Left Eye Distance:   Bilateral Distance:    Right Eye Near:   Left Eye Near:    Bilateral Near:     Physical Exam Constitutional:      General: She is not in acute distress.    Appearance: She is not toxic-appearing or diaphoretic.  HENT:     Head: Normocephalic and atraumatic.     Mouth/Throat:     Mouth: Mucous membranes are dry.     Pharynx: Oropharynx is clear.  Eyes:     General: No scleral icterus.    Conjunctiva/sclera: Conjunctivae normal.     Pupils: Pupils are equal, round, and reactive to light.  Cardiovascular:     Rate and Rhythm: Normal rate and regular rhythm.     Heart sounds: Murmur present. No gallop.   Pulmonary:     Effort: Pulmonary effort is normal. No respiratory distress.     Breath sounds: No stridor. No wheezing, rhonchi or rales.  Abdominal:     General: Bowel sounds are normal.     Tenderness: There is no abdominal tenderness.    Musculoskeletal:     Cervical back: Neck supple. No tenderness.     Right lower leg: No edema.     Left lower leg: No edema.  Lymphadenopathy:     Cervical: No cervical adenopathy.  Skin:    Coloration: Skin is not jaundiced or pale.     Findings: No bruising.  Neurological:     General: No focal deficit present.     Mental Status: She is alert and oriented to person, place, and time.      UC Treatments / Results  Labs (all labs ordered are listed, but only abnormal results are displayed) Labs Reviewed  POCT FASTING CBG [facility] MANUAL ENTRY - Abnormal; Notable for the following components:      Result Value   POCT Glucose ([facility]) 315 (*)    All other components within normal limits    EKG   Radiology No results found.  Procedures Procedures (including critical care time)  Medications Ordered in UC Medications - No data to display  Initial Impression / Assessment and Plan / UC Course  I have reviewed the triage vital signs and the nursing notes.  Pertinent labs & imaging results that were available during my care of the patient were reviewed by me and considered in my medical decision making (see chart for details).     Patient afebrile, nontoxic in office today.  CBG 315 without previous diagnosis of diabetes.  Denying increased thirst, urinary symptoms: Largely concerned as daughter provides collateral and voices concern about recent confusion, fatigue, fruity odor.  Reviewed w/ pt and daughter this increases concern for possible DKA in setting of new onset diabetic patient.  Referred to ER for further evaluation/management.  Electing to self-transport with daughter in stable condition. Final Clinical Impressions(s) / UC Diagnoses   Final diagnoses:  Hyperglycemia  Polydipsia  Problem with continuity of care  Fatigue, unspecified type     Discharge Instructions     Recommend you go to ER for further evaluation.    ED Prescriptions    None     PDMP not  reviewed this encounter.   Hall[place], [place], [place] 09/16/19 1238

## 2019-09-13 NOTE — Progress Notes (Signed)
Received report from ED RN. Room ready for patient. Barbara Swarm Joselita, RN 

## 2019-09-13 NOTE — ED Triage Notes (Signed)
Pt presents to [facility] after getting her COVID shot on 2/11.  On 2/13 she states she went to urinate, and she states her left leg gave out and she slid down to the floor.  Was unable to get up and had a bowel movement on floor, multiple family members tried to help her get up, and they called an ambulance to help her get up.  Ambulance noted temperature of 101, and CBG 404  Today, she felt hungry, weak and shaky, checked her sugar and it was 284.  Ate, and didn't feel better.  Then she had a very large bowel movement, grey in color.  Came up here to see if anything is wrong.  No hx of DM or HTN.

## 2019-09-13 NOTE — H&P (Addendum)
Date: 09/13/2019               Patient Name:  Barbara Golden MRN: [id number]  DOB: [birth year] Age / Sex: 84 y.o., female   PCP: System, Pcp Not In         Medical Service: Internal Medicine Teaching Service         Attending Physician: Dr. Aldine Contes, MD    First Contact: Marva Panda, MD, [place] Pager: [place] [phone])  Second Contact: Sherry Ruffing, MD, [place] Pager: [place] [phone])       After Hours (After 5p/  First Contact Pager: [phone]  weekends / holidays): Second Contact Pager: [phone]   Chief Complaint: Malaise  History of Present Illness: Barbara Golden is an 84 year old female with no significant past medical history who presented with hyperglycemia malaise and generalized weakness. Her weakness started roughly 2 weeks ago after getting her COVID vaccine on 2/11, and worsened over time. Her weakness had progressed and led to the patient following and hitting her head yesterday. No LOC. EMS was called and picked her off the floor.  At that time the patient was noted to be hyperglycemic to the 400s. Yesterday, the patient decided to go to urgent care to address her intermittently high blood sugars (son-in-law has diabetes and has been checking her blood sugars).  She was noted to be hyperglycemic and told to report to the ED.  Of note, 1 week ago her whole family household had vomiting and diarrhea. She states that she vomited twice and had intermittent nausea. She also had soft stool but no diarrhea. Her symptoms resolved, but her weakness and decreased PO intake remained. She has not seen a PCP in the past but did have an appointment scheduled for March 18 to establish care.  In the ED, CBC and BMP was unremarkable.  Glucose was elevated to 306.  Hgb A1c elevated to 13.  Urinalysis had large leukocytes, few bacteria, and ketones.  Urine cultures pending.  CT of the head was negative for any cute intracranial process.  Patient received a dose of ceftriaxone and a 500 cc bolus of  normal saline in the ED.  Meds:  Current Meds  Medication Sig  . famotidine (PEPCID) 10 MG tablet Take 10 mg by mouth 2 (two) times daily as needed for heartburn or indigestion.  . fexofenadine (ALLEGRA) 60 MG tablet Take 60 mg by mouth daily.  . Multiple Vitamins-Minerals (ICAPS AREDS 2 PO) Take 1 tablet by mouth in the morning and at bedtime.   Allergies: Allergies as of 09/13/2019 - Review Complete 09/13/2019  Allergen Reaction Noted  . Codeine Other (See Comments) 09/13/2019   History reviewed. No pertinent past medical history.  Past Surgical History:  Procedure Laterality Date  . TUBAL LIGATION Bilateral    Family History:  -Multiple paternal family members with diabetes -Father died of MI at 75.  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 50-pack-year history -Denies any alcohol or recreational drug use  Review of Systems: A complete ROS was negative except as per HPI.   Imaging: CT head: IMPRESSION: 1. No acute intracranial abnormality. 2. Findings consistent with age related atrophy and chronic small vessel ischemia  EKG:  Sinus rhythm.  No signs of ischemia  Physical Exam: Blood pressure (!) 156/70,  pulse 70, temperature 98.3 F (36.8 C), temperature source Oral, resp. rate 16, height 5\' 5"  (1.651 m), weight 72.6 kg, SpO2 97 %.  Physical Exam Vitals reviewed.  Constitutional:      General: She is not in acute distress.    Appearance: Normal appearance. She is normal weight. She is not ill-appearing, toxic-appearing or diaphoretic.  HENT:     Head: Normocephalic and atraumatic.  Eyes:     General: No scleral icterus.       Right eye: No discharge.        Left eye: No discharge.     Extraocular Movements: Extraocular movements intact.     Pupils: Pupils are equal, round, and  reactive to light.  Cardiovascular:     Rate and Rhythm: Normal rate and regular rhythm.     Pulses: Normal pulses.     Heart sounds: Normal heart sounds. No murmur. No friction rub. No gallop.   Pulmonary:     Effort: Pulmonary effort is normal. No respiratory distress.     Breath sounds: Normal breath sounds. No wheezing or rales.  Abdominal:     General: Abdomen is flat. Bowel sounds are normal. There is no distension.     Palpations: Abdomen is soft.     Tenderness: There is no abdominal tenderness. There is no guarding.  Musculoskeletal:        General: Normal range of motion.     Right lower leg: No edema.     Left lower leg: No edema.  Neurological:     General: No focal deficit present.     Mental Status: She is alert and oriented to person, place, and time.  Psychiatric:        Mood and Affect: Mood normal.    Assessment & Plan by Problem: Active Problems:   Dehydration  In summary, Barbara Golden is a 84 year old female with no significant past medical history who presented with a 2-week history of increased malaise and weakness which led to a fall. CT of the head was negative for an acute intracranial process. In the context of recent diarrheal illness and poor p.o. intake and new onset diabetes, dehydration and poor blood sugar control is likely contributing to her overall generalized weakness and malaise.  #Generalized Weakness/Malaise #Dehydration #UTI: Patient received 500 cc bolus in the ED as well as a dose of ceftriaxone due to urinalysis with large leukocytes and few bacteria.   -Continue ceftriaxone, switch to p.o. antibiotics tomorrow -Encouraged p.o. hydration -PT/OT ordered  #New onset T2DM: A1c on admission was 13. -SSI   -Patient has PCP appointment on 3/18 to establish care.  #Seasonal allergies -Loratadine 10 mg daily  #FEN/GI -Diet: Carb modified -Fluids: 500 cc bolus NS ordered -Famotidine 10 mg twice daily as needed  #DVT prophylaxis -Lovenox  40 mg subcu injections daily  #CODE STATUS: FULL  #Dispo: Admit patient to Observation with expected length of stay less than 2 midnights. Prior to Admission Living Arrangement: Home Anticipated Discharge Location: Home Barriers to Discharge: Pending medical workup  Signed: Earlene Plater, MD Internal Medicine, PGY1 Pager: [phone]  09/13/2019,9:42 PM

## 2019-09-13 NOTE — ED Notes (Signed)
Pt. Family would like to be called with updates; phone number is in the chart.

## 2019-09-13 NOTE — Discharge Instructions (Addendum)
Recommend you go to ER for further evaluation 

## 2019-09-14 ENCOUNTER — Telehealth: Payer: Self-pay

## 2019-09-14 DIAGNOSIS — N390 Urinary tract infection, site not specified: Secondary | ICD-10-CM | POA: Diagnosis not present

## 2019-09-14 LAB — CBC WITH DIFFERENTIAL/PLATELET
Abs Immature Granulocytes: 0.05 10*3/uL (ref 0.00–0.07)
Basophils Absolute: 0 10*3/uL (ref 0.0–0.1)
Basophils Relative: 0 %
Eosinophils Absolute: 0.2 10*3/uL (ref 0.0–0.5)
Eosinophils Relative: 1 %
HCT: 41.5 % (ref 36.0–46.0)
Hemoglobin: 13.6 g/dL (ref 12.0–15.0)
Immature Granulocytes: 1 %
Lymphocytes Relative: 34 %
Lymphs Abs: 3.6 10*3/uL (ref 0.7–4.0)
MCH: 30.6 pg (ref 26.0–34.0)
MCHC: 32.8 g/dL (ref 30.0–36.0)
MCV: 93.3 fL (ref 80.0–100.0)
Monocytes Absolute: 0.7 10*3/uL (ref 0.1–1.0)
Monocytes Relative: 7 %
Neutro Abs: 6 10*3/uL (ref 1.7–7.7)
Neutrophils Relative %: 57 %
Platelets: 334 10*3/uL (ref 150–400)
RBC: 4.45 MIL/uL (ref 3.87–5.11)
RDW: 12.5 % (ref 11.5–15.5)
WBC: 10.5 10*3/uL (ref 4.0–10.5)
nRBC: 0 % (ref 0.0–0.2)

## 2019-09-14 LAB — COMPREHENSIVE METABOLIC PANEL
ALT: 16 U/L (ref 0–44)
AST: 14 U/L — ABNORMAL LOW (ref 15–41)
Albumin: 2.7 g/dL — ABNORMAL LOW (ref 3.5–5.0)
Alkaline Phosphatase: 31 U/L — ABNORMAL LOW (ref 38–126)
Anion gap: 10 (ref 5–15)
BUN: 8 mg/dL (ref 8–23)
CO2: 25 mmol/L (ref 22–32)
Calcium: 8.3 mg/dL — ABNORMAL LOW (ref 8.9–10.3)
Chloride: 106 mmol/L (ref 98–111)
Creatinine, Ser: 0.65 mg/dL (ref 0.44–1.00)
GFR calc Af Amer: >60 mL/min (ref >60)
GFR calc non Af Amer: >60 mL/min (ref >60)
Glucose, Bld: 196 mg/dL — ABNORMAL HIGH (ref 70–99)
Potassium: 3.5 mmol/L (ref 3.5–5.1)
Sodium: 141 mmol/L (ref 135–145)
Total Bilirubin: 0.6 mg/dL (ref 0.3–1.2)
Total Protein: 5.4 g/dL — ABNORMAL LOW (ref 6.5–8.1)

## 2019-09-14 LAB — MAGNESIUM: Magnesium: 1.8 mg/dL (ref 1.7–2.4)

## 2019-09-14 LAB — SARS CORONAVIRUS 2 (TAT 6-24 HRS): SARS Coronavirus 2: NEGATIVE

## 2019-09-14 LAB — GLUCOSE, CAPILLARY
Glucose-Capillary: 178 mg/dL — ABNORMAL HIGH (ref 70–99)
Glucose-Capillary: 192 mg/dL — ABNORMAL HIGH (ref 70–99)
Glucose-Capillary: 234 mg/dL — ABNORMAL HIGH (ref 70–99)

## 2019-09-14 MED ORDER — LIVING WELL WITH DIABETES BOOK
Freq: Once | Status: AC
  Filled 2019-09-14: qty 1

## 2019-09-14 MED ORDER — INSULIN GLARGINE 100 UNIT/ML ~~LOC~~ SOLN
5.0000 [IU] | Freq: Once | SUBCUTANEOUS | Status: AC
  Administered 2019-09-14: 5 [IU] via SUBCUTANEOUS
  Filled 2019-09-14: qty 0.05

## 2019-09-14 MED ORDER — METFORMIN HCL ER 500 MG PO TB24: 500.0000 mg | ORAL_TABLET | Freq: Every day | ORAL | 11 refills | Status: DC

## 2019-09-14 MED FILL — METFORMIN HCL ER 500 MG TB2: 500 | 30 days supply | Qty: 30 | Fill #0

## 2019-09-14 NOTE — Progress Notes (Signed)
New Admission Note:   Arrival Method: Arrived from ED via stretcher. Mental Orientation: Alert and oriented x4 Telemetry: N/A Assessment: Completed Skin: Intact IV: Rt AC Pain: Denies Tubes: N/A Safety Measures: Safety Fall Prevention Plan has been discussed.  Admission: Completed 5MW Orientation: Patient has been orientated to the room, unit and staff.  Family:  None at bedside Orders have been reviewed and implemented. Will continue to monitor the patient. Call light has been placed within reach and bed alarm has been activated.   Aubreyana Saltz [employer], RN-BC Phone number: [phone]

## 2019-09-14 NOTE — Telephone Encounter (Signed)
Hosp f/u one time visit per Dr Marva Panda; pt appt 09/21/19 1045am

## 2019-09-14 NOTE — Plan of Care (Signed)
  RD consulted for nutrition education regarding diabetes.   Lab Results  Component Value Date   HGBA1C 13.0 (H) 09/13/2019    RD provided "Carbohydrate Counting for People with Diabetes" handout from the Academy of Nutrition and Dietetics. Discussed different food groups and their effects on blood sugar, emphasizing carbohydrate-containing foods. Provided list of carbohydrates and recommended serving sizes of common foods.  Discussed importance of controlled and consistent carbohydrate intake throughout the day. Provided examples of ways to balance meals/snacks and encouraged intake of high-fiber, whole grain complex carbohydrates. Teach back method used.  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 Has good knowledge of which foods and beverages contain carbohydrates. Encouraged pt to have three balanced meals daily with a consistent amount of carbs. Answered all questions.   Expect good compliance.  Current diet order is carb modified, patient is consuming approximately 75% of meals at this time. Labs and medications reviewed. No further nutrition interventions warranted at this time. RD contact information provided. If additional nutrition issues arise, please re-consult RD.   Barbara Golden RD, LDN Clinical Nutrition Pager listed in [place]

## 2019-09-14 NOTE — Progress Notes (Signed)
Subjective: HD#1 Overnight, patient admitted for generalized weakness in setting of UTI and new onset type II DM.   This morning, patient evaluated at bedside. Patient reports that she is feeling well today. She reports that she has some mild weakness, about the same as when she came in however she reports that it wasn't that bad when she came in. She denies any dysuria, abd pain, or any urinary symptoms. She reported that she would follow up with her PCP in about 1 month, reports that the PCP did not have any earlier appointment. Discussed that we can have a one time appointment down in her clinic to get her started on some medications prior to seeing her PCP. We will be starting her on metformin for now, however she will likely require additional medications.   Objective:  Vital signs in last 24 hours: Vitals:   09/13/19 2122 09/13/19 2200 09/14/19 0016 09/14/19 0545  BP:  (!) 148/77 (!) 156/69 (!) 114/48  Pulse:  71 79 71  Resp:  16 18 18   Temp:   98.7 F (37.1 C) 98.5 F (36.9 C)  TempSrc:   Oral Oral  SpO2:  96% 98% 93%  Weight: 72.6 kg  66.6 kg   Height: 5\' 5"  (1.651 m)      CBC Latest Ref Rng & Units 09/14/2019 09/13/2019  WBC 4.0 - 10.5 K/uL 10.5 8.9  Hemoglobin 12.0 - 15.0 g/dL 13.6 15.3(H)  Hematocrit 36.0 - 46.0 % 41.5 47.6(H)  Platelets 150 - 400 K/uL 334 372   BMP Latest Ref Rng & Units 09/14/2019 09/13/2019  Glucose 70 - 99 mg/dL 196(H) 306(H)  BUN 8 - 23 mg/dL 8 9  Creatinine 0.44 - 1.00 mg/dL 0.65 0.64  Sodium 135 - 145 mmol/L 141 139  Potassium 3.5 - 5.1 mmol/L 3.5 4.2  Chloride 98 - 111 mmol/L 106 101  CO2 22 - 32 mmol/L 25 27  Calcium 8.9 - 10.3 mg/dL 8.3(L) 9.2   Physical Exam  Constitutional: She is oriented to person, place, and time and well-developed, well-nourished, and in no distress. No distress.  Cardiovascular: Normal rate, regular rhythm, normal heart sounds and intact distal pulses. Exam reveals no gallop and no friction rub.  No murmur  heard. Pulmonary/Chest: Effort normal and breath sounds normal. No respiratory distress. She has no wheezes. She has no rales.  Abdominal: Soft. Bowel sounds are normal. She exhibits no distension. There is no abdominal tenderness. There is no rebound.  Musculoskeletal:        General: No tenderness or edema. Normal range of motion.  Neurological: She is alert and oriented to person, place, and time.  Skin: Skin is warm and dry.   Assessment/Plan: Barbara Golden is an 84 year old female without any significant PMHx presenting with two week history of generalized weakness with possible unwitnessed fall. Patient has had recent diarrheal illness and poor oral intake with uncontrolled blood glucose levels which may be contributing to her generalized weakness.    Generalized weakness/malaise:  Patient with two week history of generalized weakness in setting of recent diarrheal illness and poor oral intake. She received 500cc bolus in the ED. This morning, she reports she is still a bit weak but feeling much better. She was evaluated while working with OT and doing well.  -Follow up PT/OT recommendations   Asymptomatic bacteruria: Patient found to have large leukocytes with few bacteria and ketones on UA. She denies any dysuria, increased urinary frequency, change in urine color  or any suprapubic tenderness. She is afebrile and without leukocytosis on labs. On exam, no abdominal or suprapubic tenderness appreciated. She received one dose of ceftriaxone in the ED. At this time, will discontinue antibiotics as she does not have signs or symptoms of a UTI. - Discontinue ceftriaxone   New onset DM II:  Patient notes that she has been having elevated blood glucose readings ranging from 100-300 at home. She does not have a history of diagnosis but was evaluated at urgent care yesterday and noted to be hyperglycemic to 400s for which she was referred to the ED. She had elevated CBG's on admission and was  started on SSI with improvement of her CBG to 178 this morning. HbA1c 13. Patient to be started on metformin today and will likely need a second agent for better glucose control. She has an appointment scheduled with her PCP on 10/06/2019.  - Metformin 500mg  daily x1 week - Follow up in [facility] on 09/21/2019   Prior to Admission Living Arrangement: Home Anticipated Discharge Location: Home Barriers to Discharge: None Dispo: Anticipated discharge today  Harvie Heck, MD 09/14/2019, 6:43 AM Pager: [phone]

## 2019-09-14 NOTE — Plan of Care (Signed)
  Problem: Education: Goal: Knowledge of General Education information will improve Description: Including pain rating scale, medication(s)/side effects and non-pharmacologic comfort measures Outcome: Adequate for Discharge   Problem: Health Behavior/Discharge Planning: Goal: Ability to manage health-related needs will improve Outcome: Adequate for Discharge   Problem: Clinical Measurements: Goal: Ability to maintain clinical measurements within normal limits will improve 09/14/2019 1515 by Baldo Ash, RN Outcome: Adequate for Discharge 09/14/2019 0837 by Baldo Ash, RN Outcome: Progressing Goal: Will remain free from infection Outcome: Adequate for Discharge Goal: Diagnostic test results will improve Outcome: Adequate for Discharge Goal: Respiratory complications will improve Outcome: Adequate for Discharge Goal: Cardiovascular complication will be avoided Outcome: Adequate for Discharge   Problem: Activity: Goal: Risk for activity intolerance will decrease Outcome: Adequate for Discharge   Problem: Nutrition: Goal: Adequate nutrition will be maintained Outcome: Adequate for Discharge   Problem: Coping: Goal: Level of anxiety will decrease Outcome: Adequate for Discharge   Problem: Elimination: Goal: Will not experience complications related to bowel motility Outcome: Adequate for Discharge Goal: Will not experience complications related to urinary retention Outcome: Adequate for Discharge   Problem: Pain Managment: Goal: General experience of comfort will improve Outcome: Adequate for Discharge   Problem: Safety: Goal: Ability to remain free from injury will improve Outcome: Adequate for Discharge   Problem: Skin Integrity: Goal: Risk for impaired skin integrity will decrease Outcome: Adequate for Discharge   Problem: Urinary Elimination: Goal: Signs and symptoms of infection will decrease Outcome: Adequate for Discharge   Problem: Acute Rehab OT  Goals (only OT should resolve) Goal: Pt. Will Perform Grooming Outcome: Adequate for Discharge Goal: Pt. Will Perform Lower Body Bathing Outcome: Adequate for Discharge Goal: Pt. Will Perform Lower Body Dressing Outcome: Adequate for Discharge Goal: Pt. Will Transfer To Toilet Outcome: Adequate for Discharge Goal: Pt. Will Perform Toileting-Clothing Manipulation Outcome: Adequate for Discharge   Problem: Acute Rehab PT Goals(only PT should resolve) Goal: Pt Will Go Supine/Side To Sit Outcome: Adequate for Discharge Goal: Pt Will Transfer Bed To Chair/Chair To Bed Outcome: Adequate for Discharge Goal: Pt Will Perform Standing Balance Or Pre-Gait Outcome: Adequate for Discharge Goal: Pt Will Ambulate Outcome: Adequate for Discharge Goal: Pt Will Go Up/Down Stairs Outcome: Adequate for Discharge   Problem: Food- and Nutrition-Related Knowledge Deficit (NB-1.1) Goal: Nutrition education Description: Formal process to instruct or train a patient/client in a skill or to impart knowledge to help patients/clients voluntarily manage or modify food choices and eating behavior to maintain or improve health. Outcome: Adequate for Discharge

## 2019-09-14 NOTE — Evaluation (Signed)
Physical Therapy Evaluation Patient Details Name: Barbara Golden MRN: [id number] DOB: Apr 24, 1936 Today's Date: 09/14/2019   History of Present Illness  84 y/o female admitted for generalized weakness in setting of UTI and new onset type II DM. Pt's weakness started approx 2 weeks ago after getting her COVID vaccine on 2/11, and worsened over time. Due to weakness pt with x1 fall PTA where pt hit her head, no LOC. CT of the head completed this admit and was negative for any cute intracranial process.  Clinical Impression  Pt was seen for mobility after having weakness from Covid vaccine, now in hosp for UTI and new onset DM.  Her current situation is milder and is more comfortable with SPC as was PLOF.  Pt is going home with family help and will anticipate her ability to walk without issues as before.  Follow acutely for LE strengthening and work on balance/endurance to reduce her fatigue and residual symptoms from vaccine.    Follow Up Recommendations No PT follow up    Equipment Recommendations  None recommended by PT    Recommendations for Other Services       Precautions / Restrictions Precautions Precautions: Fall Precaution Comments: fell recently Restrictions Weight Bearing Restrictions: No      Mobility  Bed Mobility Overal bed mobility: Needs Assistance Bed Mobility: Supine to Sit     Supine to sit: Supervision;HOB elevated     General bed mobility comments: sitting in chair when PT arrived  Transfers Overall transfer level: Needs assistance Equipment used: None Transfers: Sit to/from Stand Sit to Stand: Supervision         General transfer comment: supervised and was able to get control of balance statically with no issue  Ambulation/Gait Ambulation/Gait assistance: Min guard(for safety) Gait Distance (Feet): 200 Feet Assistive device: 1 person hand held assist;Straight cane Gait Pattern/deviations: Step-through pattern;Decreased stride length;Wide base  of support Gait velocity: reduced   General Gait Details: pt took her time and was proactive with walking around obstacles  Stairs            Wheelchair Mobility    Modified Rankin (Stroke Patients Only)       Balance Overall balance assessment: History of Falls;Needs assistance Sitting-balance support: Feet supported Sitting balance-Leahy Scale: Good     Standing balance support: Single extremity supported;During functional activity Standing balance-Leahy Scale: Fair Standing balance comment: completing standing grooming ADL without UE support, pt often seeking at least single UE support on items in room with mobility                             Pertinent Vitals/Pain Pain Assessment: No/denies pain    Home Living Family/patient expects to be discharged to:: Private residence Living Arrangements: Children Available Help at Discharge: Family;Available 24 hours/day Type of Home: House Home Access: Stairs to enter Entrance Stairs-Rails: Right Entrance Stairs-Number of Steps: 4 at front, 1 at the back (typically uses back)(back step is a threshhold per pt) Home Layout: One level Home Equipment: Shower seat;Cane - single point      Prior Function Level of Independence: Independent with assistive device(s)         Comments: cane for back spasms     Hand Dominance        Extremity/Trunk Assessment   Upper Extremity Assessment Upper Extremity Assessment: Overall WFL for tasks assessed    Lower Extremity Assessment Lower Extremity Assessment: Overall WFL for tasks assessed  Cervical / Trunk Assessment Cervical / Trunk Assessment: Kyphotic(Mild, has back spasms at times)  Communication   Communication: No difficulties  Cognition Arousal/Alertness: Awake/alert Behavior During Therapy: WFL for tasks assessed/performed Overall Cognitive Status: Impaired/Different from baseline Area of Impairment: Memory                     Memory:  Decreased short-term memory         General Comments: did not note any difficulties with history      General Comments General comments (skin integrity, edema, etc.): with SPC pt is consistently using it, might even be helpful to have a rollator walker    Exercises     Assessment/Plan    PT Assessment Patient needs continued PT services  PT Problem List Decreased range of motion;Decreased balance;Decreased knowledge of use of DME       PT Treatment Interventions DME instruction;Gait training;Stair training;Functional mobility training;Therapeutic activities;Therapeutic exercise;Balance training;Neuromuscular re-education;Patient/family education    PT Goals (Current goals can be found in the Care Plan section)  Acute Rehab PT Goals Patient Stated Goal: home today PT Goal Formulation: With patient Time For Goal Achievement: 09/28/19 Potential to Achieve Goals: Good    Frequency Min 3X/week   Barriers to discharge   home in level house with family assist for all mobiltiy    Co-evaluation               AM-PAC PT "6 Clicks" Mobility  Outcome Measure Help needed turning from your back to your side while in a flat bed without using bedrails?: None Help needed moving from lying on your back to sitting on the side of a flat bed without using bedrails?: None Help needed moving to and from a bed to a chair (including a wheelchair)?: A Little Help needed standing up from a chair using your arms (e.g., wheelchair or bedside chair)?: A Little Help needed to walk in hospital room?: A Little Help needed climbing 3-5 steps with a railing? : A Little 6 Click Score: 20    End of Session Equipment Utilized During Treatment: Gait belt Activity Tolerance: Patient tolerated treatment well Patient left: in bed;with call bell/phone within reach;Other (comment)(per her request) Nurse Communication: Mobility status PT Visit Diagnosis: Unsteadiness on feet (R26.81);History of falling  (Z91.81)    Time: [id number] PT Time Calculation (min) (ACUTE ONLY): 28 min   Charges:   PT Evaluation $PT Eval Moderate Complexity: 1 Mod PT Treatments $Gait Training: 8-22 mins       Ramond Dial 09/14/2019, 12:10 PM  Mee Hives, PT MS Acute Rehab Dept. Number: [place] and [place]

## 2019-09-14 NOTE — Discharge Instructions (Signed)
[name], Barbara Golden were admitted to the hospital with high blood glucose levels and weakness. You improved with fluids and insulin. Your HbA1c is 13 during this admission. Please start taking Metformin 500mg  daily.  You are scheduled to follow up in the [facility] on Wednesday 09/21/2019 at 10:45AM.   Thank you!  Carbohydrate Counting For People With Diabetes  Foods with carbohydrates make your blood glucose level go up. Learning how to count carbohydrates can help you control your blood glucose levels. First, identify the foods you eat that contain carbohydrates. Then, using the Foods with Carbohydrates chart, determine about how much carbohydrates are in your meals and snacks. Make sure you are eating foods with fiber, protein, and healthy fat along with your carbohydrate foods. Foods with Carbohydrates The following table shows carbohydrate foods that have about 15 grams of carbohydrate each. Using measuring cups, spoons, or a food scale when you first begin learning about carbohydrate counting can help you learn about the portion sizes you typically eat. The following foods have 15 grams carbohydrate each:  Grains . 1 slice bread (1 ounce)  . 1 small tortilla (6-inch size)  .  large bagel (1 ounce)  . 1/3 cup pasta or rice (cooked)  .  hamburger or hot dog bun ( ounce)  .  cup cooked cereal  .  to  cup ready-to-eat cereal  . 2 taco shells (5-inch size) Fruit . 1 small fresh fruit ( to 1 cup)  .  medium banana  . 17 small grapes (3 ounces)  . 1 cup melon or berries  .  cup canned or frozen fruit  . 2 tablespoons dried fruit (blueberries, cherries, cranberries, raisins)  .  cup unsweetened fruit juice  Starchy Vegetables .  cup cooked beans, peas, corn, potatoes/sweet potatoes  .  large baked potato (3 ounces)  . 1 cup acorn or butternut squash  Snack Foods . 3 to 6 crackers  . 8 potato chips or 13 tortilla chips ( ounce to 1 ounce)  . 3 cups popped popcorn   Dairy . 3/4 cup (6 ounces) nonfat plain yogurt, or yogurt with sugar-free sweetener  . 1 cup milk  . 1 cup plain rice, soy, coconut or flavored almond milk Sweets and Desserts .  cup ice cream or frozen yogurt  . 1 tablespoon jam, jelly, pancake syrup, table sugar, or honey  . 2 tablespoons light pancake syrup  . 1 inch square of frosted cake or 2 inch square of unfrosted cake  . 2 small cookies (2/3 ounce each) or  large cookie  Sometimes you'll have to estimate carbohydrate amounts if you don't know the exact recipe. One cup of mixed foods like soups can have 1 to 2 carbohydrate servings, while some casseroles might have 2 or more servings of carbohydrate. Foods that have less than 20 calories in each serving can be counted as "free" foods. Count 1 cup raw vegetables, or  cup cooked non-starchy vegetables as "free" foods. If you eat 3 or more servings at one meal, then count them as 1 carbohydrate serving.  Foods without Carbohydrates  Not all foods contain carbohydrates. Meat, some dairy, fats, non-starchy vegetables, and many beverages don't contain carbohydrate. So when you count carbohydrates, you can generally exclude chicken, pork, beef, fish, seafood, eggs, tofu, cheese, butter, sour cream, avocado, nuts, seeds, olives, mayonnaise, water, black coffee, unsweetened tea, and zero-calorie drinks. Vegetables with no or low carbohydrate include green beans, cauliflower, tomatoes, and onions.  How much carbohydrate should I eat at each meal?  Carbohydrate counting can help you plan your meals and manage your weight. Following are some starting points for carbohydrate intake at each meal. Work with your registered dietitian nutritionist to find the best range that works for your blood glucose and weight.   To Lose Weight To Maintain Weight  Women 2 - 3 carb servings 3 - 4 carb servings  Men 3 - 4 carb servings 4 - 5 carb servings  Checking your blood glucose after meals will help you know if  you need to adjust the timing, type, or number of carbohydrate servings in your meal plan. Achieve and keep a healthy body weight by balancing your food intake and physical activity.  Tips How should I plan my meals?  Plan for half the food on your plate to include non-starchy vegetables, like salad greens, broccoli, or carrots. Try to eat 3 to 5 servings of non-starchy vegetables every day. Have a protein food at each meal. Protein foods include chicken, fish, meat, eggs, or beans (note that beans contain carbohydrate). These two food groups (non-starchy vegetables and proteins) are low in carbohydrate. If you fill up your plate with these foods, you will eat less carbohydrate but still fill up your stomach. Try to limit your carbohydrate portion to  of the plate.  What fats are healthiest to eat?  Diabetes increases risk for heart disease. To help protect your heart, eat more healthy fats, such as olive oil, nuts, and avocado. Eat less saturated fats like butter, cream, and high-fat meats, like bacon and sausage. Avoid trans fats, which are in all foods that list "partially hydrogenated oil" as an ingredient. What should I drink?  Choose drinks that are not sweetened with sugar. The healthiest choices are water, carbonated or seltzer waters, and tea and coffee without added sugars.  Sweet drinks will make your blood glucose go up very quickly. One serving of soda or energy drink is  cup. It is best to drink these beverages only if your blood glucose is low.  Artificially sweetened, or diet drinks, typically do not increase your blood glucose if they have zero calories in them. Read labels of beverages, as some diet drinks do have carbohydrate and will raise your blood glucose. Label Reading Tips Read Nutrition Facts labels to find out how many grams of carbohydrate are in a food you want to eat. Don't forget: sometimes serving sizes on the label aren't the same as how much food you are going to eat,  so you may need to calculate how much carbohydrate is in the food you are serving yourself.   Carbohydrate Counting for People with Diabetes Sample 1-Day Menu  Breakfast  cup yogurt, low fat, low sugar (1 carbohydrate serving)   cup cereal, ready-to-eat, unsweetened (1 carbohydrate serving)  1 cup strawberries (1 carbohydrate serving)   cup almonds ( carbohydrate serving)  Lunch 1, 5 ounce can chunk light tuna  2 ounces cheese, low fat cheddar  6 whole wheat crackers (1 carbohydrate serving)  1 small apple (1 carbohydrate servings)   cup carrots ( carbohydrate serving)   cup snap peas  1 cup 1% milk (1 carbohydrate serving)   Evening Meal Stir fry made with: 3 ounces chicken  1 cup brown rice (3 carbohydrate servings)   cup broccoli ( carbohydrate serving)   cup green beans   cup onions  1 tablespoon olive oil  2 tablespoons teriyaki sauce ( carbohydrate serving)  Evening Snack 1 extra small banana (1 carbohydrate serving)  1 tablespoon peanut butter   Carbohydrate Counting for People with Diabetes Vegan Sample 1-Day Menu  Breakfast 1 cup cooked oatmeal (2 carbohydrate servings)   cup blueberries (1 carbohydrate serving)  2 tablespoons flaxseeds  1 cup soymilk fortified with calcium and vitamin D  1 cup coffee  Lunch 2 slices whole wheat bread (2 carbohydrate servings)   cup baked tofu   cup lettuce  2 slices tomato  2 slices avocado   cup baby carrots ( carbohydrate serving)  1 orange (1 carbohydrate serving)  1 cup soymilk fortified with calcium and vitamin D   Evening Meal Burrito made with: 1 6-inch corn tortilla (1 carbohydrate serving)  1 cup refried vegetarian beans (2 carbohydrate servings)   cup chopped tomatoes   cup lettuce   cup salsa  1/3 cup brown rice (1 carbohydrate serving)  1 tablespoon olive oil for rice   cup zucchini   Evening Snack 6 small whole grain crackers (1 carbohydrate serving)  2 apricots ( carbohydrate serving)    cup unsalted peanuts ( carbohydrate serving)    Carbohydrate Counting for People with Diabetes Vegetarian (Lacto-Ovo) Sample 1-Day Menu  Breakfast 1 cup cooked oatmeal (2 carbohydrate servings)   cup blueberries (1 carbohydrate serving)  2 tablespoons flaxseeds  1 egg  1 cup 1% milk (1 carbohydrate serving)  1 cup coffee  Lunch 2 slices whole wheat bread (2 carbohydrate servings)  2 ounces low-fat cheese   cup lettuce  2 slices tomato  2 slices avocado   cup baby carrots ( carbohydrate serving)  1 orange (1 carbohydrate serving)  1 cup unsweetened tea  Evening Meal Burrito made with: 1 6-inch corn tortilla (1 carbohydrate serving)   cup refried vegetarian beans (1 carbohydrate serving)   cup tomatoes   cup lettuce   cup salsa  1/3 cup brown rice (1 carbohydrate serving)  1 tablespoon olive oil for rice   cup zucchini  1 cup 1% milk (1 carbohydrate serving)  Evening Snack 6 small whole grain crackers (1 carbohydrate serving)  2 apricots ( carbohydrate serving)   cup unsalted peanuts ( carbohydrate serving)    Copyright 2020  Academy of [employer] and Dietetics. All rights reserved.  Using Nutrition Labels: Carbohydrate  . Serving Size  . Look at the serving size. All the information on the label is based on this portion. Randol Kern Per Container  . The number of servings contained in the package. . Guidelines for Carbohydrate  . Look at the total grams of carbohydrate in the serving size.  . 1 carbohydrate choice = 15 grams of carbohydrate. Range of Carbohydrate Grams Per Choice  Carbohydrate Grams/Choice Carbohydrate Choices  6-10   11-20 1  21-25 1  26-35 2  36-40 2  41-50 3  51-55 3  56-65 4  66-70 4  71-80 5    Copyright 2020  Academy of [employer] and Dietetics. All rights reserved.

## 2019-09-14 NOTE — Progress Notes (Signed)
Barbara Golden to be discharged home per MD order. Discussed prescriptions and follow up appointments with the patient. Prescriptions given to patient; medication list explained in detail. Patient verbalized understanding.  Skin clean, dry and intact without evidence of skin break down, no evidence of skin tears noted. IV catheter discontinued intact. Site without signs and symptoms of complications. Dressing and pressure applied. Pt denies pain at the site currently. No complaints noted.  Patient free of lines, drains, and wounds.   An After Visit Summary (AVS) was printed and given to the patient. Patient escorted via wheelchair, and discharged home via private auto.  Baldo Ash, RN

## 2019-09-14 NOTE — Plan of Care (Signed)
  Problem: Activity: Goal: Risk for activity intolerance will decrease Outcome: Progressing   

## 2019-09-14 NOTE — Evaluation (Signed)
Occupational Therapy Evaluation Patient Details Name: Barbara Golden MRN: [id number] DOB: 11-09-1935 Today's Date: 09/14/2019    History of Present Illness Pt is an 84 y/o female admitted for generalized weakness in setting of UTI and new onset type II DM. Pt's weakness started approx 2 weeks ago after getting her COVID vaccine on 2/11, and worsened over time. Due to weakness pt with x1 fall PTA where pt hit her head, no LOC. CT of the head completed this admit and was negative for any cute intracranial process.   Clinical Impression   This 84 y/o female presents with the above. PTA pt reports mod independence with ADL and functional mobility using SPC; lives with daughter and son-in-law. Pt overall completing room level functional mobility (pushing IV pole) and ADL tasks at minguard-supervision level. Pt tolerating two bouts of standing activity, both approx 5 min each. Pt reports she typically mobilizes in her home without AD, however noted pt often seeking single UE support on items in room this session for added stability. She will benefit from continued acute OT services to progress her towards her PLOF, do not anticipate pt will require follow up OT services after discharge. Will follow.     Follow Up Recommendations  No OT follow up;Supervision/Assistance - 24 hour    Equipment Recommendations  None recommended by OT           Precautions / Restrictions Precautions Precautions: Fall Precaution Comments: x1 fall PTA Restrictions Weight Bearing Restrictions: No      Mobility Bed Mobility Overal bed mobility: Needs Assistance Bed Mobility: Supine to Sit     Supine to sit: Supervision;HOB elevated     General bed mobility comments: for safety  Transfers Overall transfer level: Needs assistance Equipment used: None Transfers: Sit to/from Stand Sit to Stand: Min guard;Supervision         General transfer comment: minguard initially progressed to supervision, for  safety and balance. stood from EOB x2 during session    Balance Overall balance assessment: Needs assistance Sitting-balance support: Feet supported Sitting balance-Leahy Scale: Good     Standing balance support: Single extremity supported;During functional activity;No upper extremity supported Standing balance-Leahy Scale: Fair Standing balance comment: completing standing grooming ADL without UE support, pt often seeking at least single UE support on items in room with mobility                           ADL either performed or assessed with clinical judgement   ADL Overall ADL's : Needs assistance/impaired Eating/Feeding: Modified independent;Sitting   Grooming: Wash/dry face;Oral care;Min guard;Standing   Upper Body Bathing: Set up;Min guard;Sitting   Lower Body Bathing: Min guard;Sit to/from stand   Upper Body Dressing : Set up;Sitting   Lower Body Dressing: Min guard;Sit to/from stand   Toilet Transfer: Minimal assistance;Min guard;Ambulation   Toileting- Clothing Manipulation and Hygiene: Min guard;Sit to/from stand       Functional mobility during ADLs: Minimal assistance;Min guard(pushing IV pole)       Vision Baseline Vision/History: Macular Degeneration(R eye worse than the L ) Additional Comments: pt able to navigate in room fairly easily, no notable difficulties related to vision      Perception     Praxis      Pertinent Vitals/Pain Pain Assessment: No/denies pain     Hand Dominance     Extremity/Trunk Assessment Upper Extremity Assessment Upper Extremity Assessment: Overall WFL for tasks assessed   Lower Extremity  Assessment Lower Extremity Assessment: Defer to PT evaluation   Cervical / Trunk Assessment Cervical / Trunk Assessment: (rounded shoulders)   Communication Communication Communication: No difficulties   Cognition Arousal/Alertness: Awake/alert Behavior During Therapy: WFL for tasks assessed/performed Overall  Cognitive Status: Impaired/Different from baseline Area of Impairment: Memory                     Memory: Decreased short-term memory         General Comments: overall pt appears WFL, however reports having had no falls prior to this admission when questioned (per chart pt with x1 fall just prior to admit). otherwise pt appears to be accurate historian, asking appropriate questions to MD when present in room    General Comments       Exercises     Shoulder Instructions      Home Living Family/patient expects to be discharged to:: Private residence Living Arrangements: Children Available Help at Discharge: Family;Available 24 hours/day Type of Home: House Home Access: Stairs to enter [employer] of Steps: 4 at front, 1 at the back (typically uses back)   Home Layout: One level     Bathroom Shower/Tub: Tub/shower unit;Walk-in Psychologist, prison and probation services: Standard     Home Equipment: Shower seat;Cane - single point          Prior Functioning/Environment Level of Independence: Independent with assistive device(s)        Comments: typically doesn't use cane in the home, doesn't drive due to visual deficits         OT Problem List: Decreased activity tolerance;Impaired balance (sitting and/or standing);Decreased knowledge of use of DME or AE;Decreased cognition      OT Treatment/Interventions: Self-care/ADL training;Therapeutic exercise;Energy conservation;DME and/or AE instruction;Therapeutic activities;Patient/family education;Balance training;Cognitive remediation/compensation    OT Goals(Current goals can be found in the care plan section) Acute Rehab OT Goals Patient Stated Goal: home today OT Goal Formulation: With patient Time For Goal Achievement: 09/28/19 Potential to Achieve Goals: Good  OT Frequency: Min 2X/week   Barriers to D/C:            Co-evaluation              AM-PAC OT "6 Clicks" Daily Activity     Outcome  Measure Help from another person eating meals?: None Help from another person taking care of personal grooming?: A Little Help from another person toileting, which includes using toliet, bedpan, or urinal?: A Little Help from another person bathing (including washing, rinsing, drying)?: A Little Help from another person to put on and taking off regular upper body clothing?: None Help from another person to put on and taking off regular lower body clothing?: A Little 6 Click Score: 20   End of Session Equipment Utilized During Treatment: Gait belt Nurse Communication: Mobility status  Activity Tolerance: Patient tolerated treatment well Patient left: in chair;with call bell/phone within reach;with chair alarm set  OT Visit Diagnosis: Unsteadiness on feet (R26.81);Muscle weakness (generalized) (M62.81)                Time: [id number] OT Time Calculation (min): 40 min Charges:  OT General Charges $OT Visit: 1 Visit OT Evaluation $OT Eval Moderate Complexity: 1 Mod OT Treatments $Self Care/Home Management : 23-37 mins  Lou Cal, OT Supplemental Rehabilitation Services Pager [phone] Office [phone]   Raymondo Band 09/14/2019, 9:58 AM

## 2019-09-14 NOTE — Plan of Care (Signed)
  Problem: Clinical Measurements: Goal: Ability to maintain clinical measurements within normal limits will improve Outcome: Progressing   

## 2019-09-14 NOTE — Discharge Summary (Signed)
Name: Barbara Golden MRN: [id number] DOB: 02-23-1936 84 y.o. PCP: System, Pcp Not In  Date of Admission: 09/13/2019  2:08 PM Date of Discharge: 09/14/2019 Attending Physician: Dr. Aldine Contes  Discharge Diagnosis: 1. Type II Diabetes 2. Asymptomatic bacteruria   Discharge Medications: Allergies as of 09/14/2019      Reactions   Codeine Other (See Comments)   Causes lip burning and hyperactivity      Medication List    TAKE these medications   famotidine 10 MG tablet Commonly known as: PEPCID Take 10 mg by mouth 2 (two) times daily as needed for heartburn or indigestion.   fexofenadine 60 MG tablet Commonly known as: ALLEGRA Take 60 mg by mouth daily.   ICAPS AREDS 2 PO Take 1 tablet by mouth in the morning and at bedtime.   metFORMIN 500 MG 24 hr tablet Commonly known as: Glucophage XR Take 1 tablet (500 mg total) by mouth daily with breakfast.       Disposition and follow-up:   BarbaraKaizlee L Bonnette was discharged from [facility] in Stable condition.  At the hospital follow up visit please address:  1.  Type II Diabetes: A1C 13. Discharged on metformin 500mg  daily. Will likely need second agent. Patient to follow up once in clinic for CBG monitoring. She has PCP appointment on 3/18.   Asymptomatic bacteruria: Noted to have leukocytes and bacteruria on UA. Asymptomatic. No further work up needed   2.  Labs / imaging needed at time of follow-up: glucose   3.  Pending labs/ test needing follow-up: None  Follow-up Appointments: Follow-up Information    [place]. Go on 10/19/2019.   Why: at 10:45AM Contact information: [address] [place] Hospital Course by problem list: 1. Type II Diabetes  Barbara Golden is an 84 year old female with no significant past medical history who presented with hyperglycemia, malaise and generalized weakness, found to have new onset  type II diabetes with hemoglobin a1c of 13. She had recently been found to have consistently elevated glucose after a fall two weeks ago with glucose up to the 400s noted by EMT. She was slightly dehydrated secondary to decreased po intake and nausea with elevated glucose. Dietician consulted for nutrition education regarding diabetes. She was discharged with metformin and will follow-up in [facility] clinic on 09/21/2019 and then with her PCP on March 18th.    2. Asymptomatic bacteruria She was noted to have bacteriuria with large leukocytes and small amount bacteria but was asymptomatic.She was given one dose of ceftriaxone in the ED. However, given lack of fever, leukocytosis, or symptoms, patient does not need further antibiotics.   Discharge Vitals:   BP (!) 114/48 (BP Location: Left Arm)   Pulse 71   Temp 98.5 F (36.9 C) (Oral)   Resp 18   Ht 5\' 5"  (1.651 m)   Wt 66.6 kg   SpO2 93%   BMI 24.43 kg/m   Pertinent Labs, Studies, and Procedures:  CBC Latest Ref Rng & Units 09/14/2019 09/13/2019  WBC 4.0 - 10.5 K/uL 10.5 8.9  Hemoglobin 12.0 - 15.0 g/dL 13.6 15.3(H)  Hematocrit 36.0 - 46.0 % 41.5 47.6(H)  Platelets 150 - 400 K/uL 334 372   BMP Latest Ref Rng & Units 09/14/2019 09/13/2019  Glucose 70 - 99 mg/dL 196(H) 306(H)  BUN 8 - 23 mg/dL 8 9  Creatinine 0.44 - 1.00 mg/dL  0.65 0.64  Sodium 135 - 145 mmol/L 141 139  Potassium 3.5 - 5.1 mmol/L 3.5 4.2  Chloride 98 - 111 mmol/L 106 101  CO2 22 - 32 mmol/L 25 27  Calcium 8.9 - 10.3 mg/dL 8.3(L) 9.2   Urinalysis    Component Value Date/Time   COLORURINE YELLOW 09/13/2019 1935   APPEARANCEUR HAZY (A) 09/13/2019 1935   LABSPEC 1.031 (H) 09/13/2019 1935   PHURINE 5.0 09/13/2019 1935   GLUCOSEU >=500 (A) 09/13/2019 1935   HGBUR NEGATIVE 09/13/2019 1935   BILIRUBINUR NEGATIVE 09/13/2019 1935   KETONESUR 5 (A) 09/13/2019 1935   PROTEINUR NEGATIVE 09/13/2019 1935   UROBILINOGEN 0.2 03/27/2015 1641   NITRITE NEGATIVE 09/13/2019 1935    LEUKOCYTESUR LARGE (A) 09/13/2019 1935   Hemoglobin a1c: 13  CT Head 09/13/2019 IMPRESSION: No acute intracranial abnormality. Findings consistent with age related atrophy and chronic small vessel ischemia  Discharge Instructions: Discharge Instructions    Call MD for:   Complete by: As directed    Call MD for:  difficulty breathing, headache or visual disturbances   Complete by: As directed    Call MD for:  extreme fatigue   Complete by: As directed    Call MD for:  hives   Complete by: As directed    Call MD for:  persistant dizziness or light-headedness   Complete by: As directed    Call MD for:  persistant nausea and vomiting   Complete by: As directed    Call MD for:  severe uncontrolled pain   Complete by: As directed    Call MD for:  temperature >100.4   Complete by: As directed    Diet - low sodium heart healthy   Complete by: As directed    Increase activity slowly   Complete by: As directed     Barbara Golden   You were admitted to the hospital with high blood glucose levels and weakness. You improved with fluids and insulin. Your HbA1c is 13 during this admission. Please start taking Metformin 500mg  daily.  You are scheduled to follow up in the Internal Medicine Clinic on Wednesday 09/21/2019 at 10:45AM.   Thank you!  SignedHarvie Heck, MD 09/14/2019, 4:47 PM   Pager: [phone]

## 2019-09-15 LAB — URINE CULTURE: Culture: 30000 — AB

## 2019-09-16 NOTE — Telephone Encounter (Signed)
Transition Care Management followup telephone call completed.  Transition Care Management Follow-up Telephone Call  Date of discharge and from where: 09/14/19 from [facility]  How have you been since you were released from the hospital? "I've been fine"  Any questions or concerns? No   Items Reviewed:  Did the pt receive and understand the discharge instructions provided? Yes   Medications obtained and verified? Yes   Any new allergies since your discharge? No   Dietary orders reviewed? Yes  Do you have support at home? Yes   Functional Questionnaire: (I = Independent and D = Dependent) ADLs: I  Bathing/Dressing- I Meal Prep-I  Eating- I  Maintaining continence- I Transferring/Ambulation- I  Managing Meds- I  Follow up appointments reviewed:   PCP Hospital f/u appt confirmed? Yes  Scheduled to see [facility] on 09/21/19  @ 1045.  [facility] f/u appt confirmed? Yes  Scheduled to see [facility] on 10/06/19 @ 1:30.  Are transportation arrangements needed? No   If their condition worsens, is the pt aware to call PCP or go to the Emergency Dept.? Yes  Was the patient provided with contact information for the PCP's office or ED? Yes  Was to pt encouraged to call back with questions or concerns? Yes

## 2019-09-21 ENCOUNTER — Encounter: Payer: Self-pay | Admitting: Internal Medicine

## 2019-09-21 ENCOUNTER — Other Ambulatory Visit: Payer: Self-pay

## 2019-09-21 ENCOUNTER — Ambulatory Visit (INDEPENDENT_AMBULATORY_CARE_PROVIDER_SITE_OTHER): Payer: Medicare HMO | Admitting: Dietician

## 2019-09-21 ENCOUNTER — Encounter: Payer: Self-pay | Admitting: Dietician

## 2019-09-21 ENCOUNTER — Ambulatory Visit (INDEPENDENT_AMBULATORY_CARE_PROVIDER_SITE_OTHER): Payer: Medicare HMO | Admitting: Internal Medicine

## 2019-09-21 VITALS — BP 148/59 | HR 85 | Temp 98.2°F | Ht 65.0 in | Wt 147.6 lb

## 2019-09-21 DIAGNOSIS — E119 Type 2 diabetes mellitus without complications: Secondary | ICD-10-CM | POA: Diagnosis not present

## 2019-09-21 DIAGNOSIS — Z7984 Long term (current) use of oral hypoglycemic drugs: Secondary | ICD-10-CM | POA: Diagnosis not present

## 2019-09-21 DIAGNOSIS — Z79899 Other long term (current) drug therapy: Secondary | ICD-10-CM | POA: Diagnosis not present

## 2019-09-21 DIAGNOSIS — Z713 Dietary counseling and surveillance: Secondary | ICD-10-CM | POA: Diagnosis not present

## 2019-09-21 DIAGNOSIS — I10 Essential (primary) hypertension: Secondary | ICD-10-CM | POA: Insufficient documentation

## 2019-09-21 DIAGNOSIS — R8271 Bacteriuria: Secondary | ICD-10-CM | POA: Diagnosis not present

## 2019-09-21 DIAGNOSIS — E11319 Type 2 diabetes mellitus with unspecified diabetic retinopathy without macular edema: Secondary | ICD-10-CM | POA: Diagnosis not present

## 2019-09-21 LAB — GLUCOSE, CAPILLARY: Glucose-Capillary: 279 mg/dL — ABNORMAL HIGH (ref 70–99)

## 2019-09-21 MED ORDER — METFORMIN HCL ER 500 MG PO TB24: 500.0000 mg | ORAL_TABLET | Freq: Two times a day (BID) | ORAL | 2 refills | Status: DC

## 2019-09-21 MED ORDER — TRULICITY 0.75 MG/0.5ML ~~LOC~~ SOAJ: 0.7500 mg | SUBCUTANEOUS | 2 refills | Status: DC

## 2019-09-21 NOTE — Assessment & Plan Note (Signed)
HTN: Patient is mildly hypertensive today on exam but was normotensive at 114/48 when discharged on 2/24 but was again 156/69 on presentation at that admission.  At this time I would advise monitoring her blood pressure and considering therapy if she remains persistently elevated.

## 2019-09-21 NOTE — Progress Notes (Signed)
   CC: Hospital discharge follow-up  HPI:[name] is a 84 y.o. female who presents for evaluation of a new diabetes diagnosis. Please see individual problem based A/P for details.  No past medical history on file. Review of Systems:  ROS negative except as per HPI.  Physical Exam: Vitals:   09/21/19 1046  BP: (!) 148/59  Pulse: 85  Temp: 98.2 F (36.8 C)  TempSrc: Oral  SpO2: 96%  Weight: 147 lb 9.6 oz (67 kg)  Height: 5\' 5"  (1.651 m)   Filed Weights   09/21/19 1046  Weight: 147 lb 9.6 oz (67 kg)   General: A/O x4, in no acute distress, afebrile, nondiaphoretic HEENT: PEERL, EMO intact Cardio: RRR, no mrg's  Pulmonary: CTA bilaterally, no wheezing or crackles  Abdomen: Soft, nontender  MSK: BLE nontender, nonedematous Neuro: Alert, conversational, normal gait Psych: Appropriate affect, not depressed in appearance, engages well  Assessment & Plan:   See Encounters Tab for problem based charting.  Patient discussed with Dr. Dareen Piano

## 2019-09-21 NOTE — Progress Notes (Signed)
Diabetes Self-Management Education  Visit Type: First/Initial  Appt. Start Time: 1130 Appt. End Time: [id number]  09/21/2019  [name], identified by name and date of birth, is a 84 y.o. female with a diagnosis of Diabetes: Type 2.   ASSESSMENT  Advised on action and side effects of GLP-1s. She reports unintentional weight loss of 10-20# prior to diabetes diagnosis. Advised no weight loss  Wt Readings from Last 10 Encounters:  09/21/19 147 lb 9.6 oz (67 kg)  09/14/19 146 lb 13.2 oz (66.6 kg)   Lab Results  Component Value Date   HGBA1C 13.0 (H) 09/13/2019       Diabetes Self-Management Education - 09/21/19 1400      Visit Information   Visit Type  First/Initial      Initial Visit   Diabetes Type  Type 2    Are you currently following a meal plan?  No    Are you taking your medications as prescribed?  Yes    Date Diagnosed  2021      Psychosocial Assessment   Patient Belief/Attitude about Diabetes  Motivated to manage diabetes    Self-care barriers  Impaired vision    Self-management support  Doctor's office;Family;CDE visits    Patient Concerns  Nutrition/Meal planning;Medication;Glycemic Control    Special Needs  Large print    Preferred Learning Style  Auditory    Learning Readiness  Ready      Pre-Education Assessment   Patient understands incorporating nutritional management into lifestyle.  Needs Instruction    Patient understands using medications safely.  Needs Instruction      Complications   Last HgB A1C per patient/outside source  13 %    How often do you check your blood sugar?  0 times/day (not testing)       Individualized Plan for Diabetes Self-Management Training:   Learning Objective:  Patient will have a greater understanding of diabetes self-management. Patient education plan is to attend individual and/or group sessions per assessed needs and concerns.   Plan:   There are no Patient Instructions on file for this visit.  Expected  Outcomes:     Education material provided: Carbohydrate counting sheet  If problems or questions, patient to contact team via:  Phone  Future DSME appointment:   as needed Debera Lat, RD 09/21/2019 2:42 PM.

## 2019-09-21 NOTE — Progress Notes (Signed)
Internal Medicine Clinic Attending  Case discussed with Dr. Harbrecht at the time of the visit.  We reviewed the resident's history and exam and pertinent patient test results.  I agree with the assessment, diagnosis, and plan of care documented in the resident's note.   

## 2019-09-21 NOTE — Assessment & Plan Note (Signed)
  Bacteriuria:         Patient continues to deny dysuria, polyuria, fever, chills or other urinary symptoms.  I felt it was appropriate to not treat her asymptomatic bacteriuria.  No follow-up recommended.

## 2019-09-21 NOTE — Patient Instructions (Signed)
FOLLOW-UP INSTRUCTIONS When: Please follow-up and establish care with your new primary care physician on March 18th What to bring: All of your medications and today's paperwork  Today we discussed your diabetes mellitus diagnosis.  I would like for you to increase the Metformin to 500 mg twice daily with meals.  Additionally, we discussed starting a medication called Trulicity at 1 injection weekly of 0.75 mg.  This medication may need to be approved with a prior authorization for your insurance to cover the cost.  This medication has been side effects including pancreatitis and weight loss.  If you notice significant weight loss or concerns about this while taking the medication please discuss this with your provider as he may consider starting you on an alternative therapies such as insulin.   As always if your symptoms worsen, fail to improve, or you develop other concerning symptoms, please notify our office or visit the local ER if we are unavailable. Symptoms including weakness, confusion, sweating, increased thirst/urination, high blood sugar levels, should not be ignored and should encourage you to visit the ED if we are unavailable by phone or the symptoms are severe.  Thank you for your visit to the [facility] today. If you have any questions or concerns please call us at [phone].

## 2019-09-21 NOTE — Assessment & Plan Note (Signed)
  DMII: Patient was admitted for hyperglycemia and asymptomatic bacteriuria on September 13, 2019.  She was discharged the following day on Metformin 500 mg once daily.  She is concerned as no significant diabetes related nutritional guidance was given prior to discharge.  Her Hgb A1c was 13 % during her hospitalization. The patient denied polyuria, polydipsia, headache, fatigue, confusion, nausea, vomiting or diaphoresis. Her BMI is is WNL's cautioning use of weight loss inducing treatments such as a GLP-1 for SGLT2's.  However, she is opposed to initiation of treatment with insulin to which I agree.  Given her A1c is greater than 9%, initiation of treatment with medications such as GLP-1 or insulin as well as strict dietary modifications are recommended.  I have would like to avoid insulin as well given the possible weight gain and no notable mortality benefits from use.  CBG today 279 mg/dl.  Plan: Continue metformin increased to 500mg  BID with meals from 5 mg once daily Start Trulicity 0.75mg /0.59ml weekly Referral to nutrition diabetes management counselor placed, patient is seeing them today She will follow with her new PCP to discuss up titration or alternative therapy based on her response to GLP-1 and increase Metformin Patient is been advised to call back for medication affordability is an issue we will try alternative medication Patient is given strict return precautions Repeat A1c at next visit

## 2019-09-21 NOTE — Addendum Note (Signed)
Addended by: Nicola Girt on: 09/21/2019 12:54 PM   Modules accepted: Orders

## 2019-10-05 ENCOUNTER — Encounter: Payer: Self-pay | Admitting: Internal Medicine

## 2019-10-06 ENCOUNTER — Other Ambulatory Visit: Payer: Self-pay

## 2019-10-06 ENCOUNTER — Ambulatory Visit (INDEPENDENT_AMBULATORY_CARE_PROVIDER_SITE_OTHER): Payer: Medicare HMO | Admitting: Internal Medicine

## 2019-10-06 ENCOUNTER — Encounter: Payer: Self-pay | Admitting: Internal Medicine

## 2019-10-06 VITALS — BP 130/78 | HR 76 | Temp 97.5°F | Ht 65.0 in | Wt 148.0 lb

## 2019-10-06 DIAGNOSIS — H3530 Unspecified macular degeneration: Secondary | ICD-10-CM | POA: Insufficient documentation

## 2019-10-06 DIAGNOSIS — Z13220 Encounter for screening for lipoid disorders: Secondary | ICD-10-CM

## 2019-10-06 DIAGNOSIS — E1165 Type 2 diabetes mellitus with hyperglycemia: Secondary | ICD-10-CM | POA: Diagnosis not present

## 2019-10-06 DIAGNOSIS — I10 Essential (primary) hypertension: Secondary | ICD-10-CM | POA: Diagnosis not present

## 2019-10-06 DIAGNOSIS — M62830 Muscle spasm of back: Secondary | ICD-10-CM | POA: Diagnosis not present

## 2019-10-06 DIAGNOSIS — N393 Stress incontinence (female) (male): Secondary | ICD-10-CM | POA: Diagnosis not present

## 2019-10-06 DIAGNOSIS — E1140 Type 2 diabetes mellitus with diabetic neuropathy, unspecified: Secondary | ICD-10-CM | POA: Insufficient documentation

## 2019-10-06 MED ORDER — FREESTYLE LIBRE 14 DAY READER DEVI: 1.0000 [IU] | Freq: Two times a day (BID) | 2 refills | Status: DC

## 2019-10-06 MED ORDER — FREESTYLE LIBRE 14 DAY SENSOR MISC: 2.0000 [IU] | Freq: Two times a day (BID) | 2 refills | Status: DC

## 2019-10-06 NOTE — Progress Notes (Signed)
Provider:  Rexene Edison. Mariea Clonts, D.O., C.M.D. Location:   [place]  Place of Service:    clinic  Previous PCP: Gayland Curry, DO Patient Care Team: Gayland Curry, DO as PCP - General (Geriatric Medicine)  Extended Emergency Contact Information Primary Emergency Contact: Theador Hawthorne [place] Phone: [phone] Mobile Phone: [phone] Relation: Daughter  Goals of Care: Advanced Directive information Advanced Directives 10/06/2019  Does Patient Have a Medical Advance Directive? Yes  Type of Paramedic of [place];Living will  Does patient want to make changes to medical advance directive? No - Patient declined  Copy of [place] in Chart? No - copy requested  Would patient like information on creating a medical advance directive? -  she is having her hcpoa redone due to her daughter being married; has that and living will also.   Has down that if she has a good prognosis, to put on life support and keep on 5 days, if no signs of life, discontinue She will bring a copy after she has both documents updated with correct name info on them.  Chief Complaint  Patient presents with  . Establish Care    new patient to estabalish care     HPI: Patient is a 84 y.o. female seen today to establish with [facility].  Records have been requested from   In excellent health.  Takes reflux med, macular vitamin No surgeries except years ago.    She has not yet taken trulicity that was recommended at the hospital.  She has no meter to check sugar.  She wanted freestyle libre, but was told she could not get it.  She checked her sugar with her son in law's meter.  Macular limits her ability to see to stick herself.    She does not drive anymore due to safety concerns with her macular.  She accepts it as it comes.  Loves to read and cannot.  She has been going to Dr. Rachael Fee office to different people.  She is due to go back.     She is from [facility].  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  She loved every minute of that work.  She is a people person.    Has occasional indigestion.   Has a little bit of leaky bladder.  Wears a pad in case of coughing--stress incontinence.  No urge incontinence.  Does immediately go first thing in the morning.  Drinks a lot of water during the day.  Will sip water overnight.  She had a less than 10 pack year smoking history.  She was too busy working in the cath lab.  She also helped with reading and applying holter monitors.    She has two questions:   Who ordered CT on her and why?  She had one 2/23 of the head--she says she did not fall or hit her head.  She could not get up after going to the bathroom.  When she stood up, te left leg buckled, Pam couldn't get her up and her other daughter had just had hip surgery.  Two grandsons couldn't get her up.  CBG was high 284 and she says it was 404.  She was lethargic, weak, forgetting things, had a gray bm, vomiting.  Pt reports now that she'd had her second covid vaccine just before that.  She got a new diagnosis of diabetes with CBG 315.  hba1c turned out  to be 13 when she got admitted to the hospital.  She was started on metformin 500mg  daily.  She had asymptomatic bacteriuria also.  She was then discharged with [facility]  There her metformin was increased to 500mg  by mouth twice a day with meals.  Going fine.  Had diarrhea a couple of days, but then resolved.  She says she can do ok on her diet.  Trying to sit down and figure out her carbs and plan her meals per day.  Snacks are popcorn.  Loves fruit--getting 1 or 2 oz sugarless fruits.    She has lost some weight.  Notes she had weighed 158 and now down to 148 lbs.    She has three cats.  2 dogs--rescue poodle and another.    Sleeps only lightly but well.  Her husband was chronically ill with copd, diabetes and sleep apnea.  She  was home bound as his caregiver.  Dr. Joya Gaskins took care of him.    Sees podiatry on [employer].    Past Medical History:  Diagnosis Date  . Allergy    Past Surgical History:  Procedure Laterality Date  . TUBAL LIGATION Bilateral     Social History   Socioeconomic History  . Marital status: Married    Spouse name: Not on file  . Number of children: Not on file  . Years of education: Not on file  . Highest education level: Not on file  Occupational History  . Not on file  Tobacco Use  . Smoking status: Former Smoker    Packs/day: 0.50    Types: Cigarettes    Quit date: 03/26/2009    Years since quitting: 10.5  . Smokeless tobacco: Never Used  Substance and Sexual Activity  . Alcohol use: No  . Drug use: No  . Sexual activity: Not Currently  Other Topics Concern  . Not on file  Social History Narrative  . Not on file   Social Determinants of Health   Financial Resource Strain:   . Difficulty of Paying Living Expenses:   Food Insecurity:   . Worried About Charity fundraiser in the Last Year:   . Arboriculturist in the Last Year:   Transportation Needs:   . Film/video editor (Medical):   Marland Kitchen Lack of Transportation (Non-Medical):   Physical Activity:   . Days of Exercise per Week:   . Minutes of Exercise per Session:   Stress:   . Feeling of Stress :   Social Connections:   . Frequency of Communication with Friends and Family:   . Frequency of Social Gatherings with Friends and Family:   . Attends Religious Services:   . Active Member of Clubs or Organizations:   . Attends Archivist Meetings:   Marland Kitchen Marital Status:     reports that she quit smoking about 10 years ago. Her smoking use included cigarettes. She smoked 0.50 packs per day. She has never used smokeless tobacco. She reports that she does not drink alcohol or use drugs.  Functional Status Survey:  independent except driving  Family History  Problem Relation Age of Onset  . Cancer  Mother   . Heart disease Father     Health Maintenance  Topic Date Due  . OPHTHALMOLOGY EXAM  09/19/2019  . URINE MICROALBUMIN  11/06/2019 (Originally 03/21/1946)  . DEXA SCAN  10/05/2020 (Originally 03/22/2003)  . PNA vac Low Risk Adult (1  of 2 - PCV13) 10/05/2020 (Originally 03/22/2003)  . HEMOGLOBIN A1C  03/12/2020  . FOOT EXAM  09/12/2020  . TETANUS/TDAP  10/04/2028  . INFLUENZA VACCINE  Completed    Allergies  Allergen Reactions  . Codeine Other (See Comments)    Causes lip burning and hyperactivity    Outpatient Encounter Medications as of 10/06/2019  Medication Sig  . Continuous Blood Gluc Receiver (FREESTYLE LIBRE 14 DAY READER) DEVI 1 Units by Does not apply route 2 (two) times daily.  . Dulaglutide (TRULICITY) [id number] 0000000 SOPN Inject 0.75 mg into the skin once a week.  . famotidine (PEPCID) 10 MG tablet Take 10 mg by mouth 2 (two) times daily as needed for heartburn or indigestion.  . fexofenadine (ALLEGRA) 60 MG tablet Take 60 mg by mouth daily.  . metFORMIN (GLUCOPHAGE XR) 500 MG 24 hr tablet Take 1 tablet (500 mg total) by mouth 2 (two) times daily with a meal.  . [DISCONTINUED] Continuous Blood Gluc Receiver (FREESTYLE LIBRE 14 DAY READER) DEVI by Does not apply route.  . Continuous Blood Gluc Sensor (FREESTYLE LIBRE 14 DAY SENSOR) MISC 2 Units by Does not apply route in the morning and at bedtime.  . [DISCONTINUED] cetirizine (ZYRTEC) 10 MG tablet Take 10 mg by mouth daily as needed for allergies.  . [DISCONTINUED] Continuous Blood Gluc Sensor (FREESTYLE LIBRE 14 DAY SENSOR) MISC by Does not apply route.  . [DISCONTINUED] Multiple Vitamins-Minerals (ICAPS AREDS 2 PO) Take 1 tablet by mouth in the morning and at bedtime.  . [DISCONTINUED] ranitidine (ZANTAC) 150 MG tablet Take 150 mg by mouth daily as needed for heartburn.   No facility-administered encounter medications on file as of 10/06/2019.    Review of Systems  Constitutional: Positive for weight loss. Negative  for chills and fever.  HENT: Negative for congestion, hearing loss and sore throat.        Gets sinus problems  Eyes: Positive for blurred vision. Negative for double vision, photophobia and pain.       Dry eyes, macular degeneration--no longer drives  Respiratory: Negative for cough and shortness of breath.   Cardiovascular: Negative for chest pain, palpitations and leg swelling.  Gastrointestinal: Positive for heartburn. Negative for abdominal pain, constipation, diarrhea, nausea and vomiting.  Genitourinary: Negative for dysuria, frequency and urgency.       Stress incontinence  Musculoskeletal: Positive for back pain. Negative for joint pain.  Skin: Negative for itching and rash.       Dry skin  Neurological: Negative for dizziness and loss of consciousness.  Endo/Heme/Allergies:       Diabetes  Psychiatric/Behavioral: Positive for memory loss. Negative for depression. The patient is not nervous/anxious and does not have insomnia.        Mild memory loss    Vitals:   10/06/19 1349  BP: 130/78  Pulse: 76  Temp: (!) 97.5 F (36.4 C)  SpO2: 97%  Weight: 148 lb (67.1 kg)  Height: 5\' 5"  (1.651 m)   Body mass index is 24.63 kg/m. Physical Exam Vitals reviewed.  Constitutional:      General: She is not in acute distress.    Appearance: Normal appearance. She is not ill-appearing or toxic-appearing.  HENT:     Head: Normocephalic and atraumatic.     Right Ear: Tympanic membrane, ear canal and external ear normal.     Left Ear: Tympanic membrane, ear canal and external ear normal.     Nose: Nose normal. No congestion or rhinorrhea.  Mouth/Throat:     Mouth: Mucous membranes are dry.     Pharynx: Oropharynx is clear. No oropharyngeal exudate.  Eyes:     General: No scleral icterus.    Extraocular Movements: Extraocular movements intact.     Conjunctiva/sclera: Conjunctivae normal.     Pupils: Pupils are equal, round, and reactive to light.  Cardiovascular:     Rate and  Rhythm: Normal rate and regular rhythm.     Pulses: Normal pulses.     Heart sounds: Normal heart sounds.  Pulmonary:     Effort: Pulmonary effort is normal.     Breath sounds: Normal breath sounds. No wheezing, rhonchi or rales.  Abdominal:     General: Bowel sounds are normal. There is no distension.     Palpations: Abdomen is soft. There is no mass.     Tenderness: There is no abdominal tenderness. There is no right CVA tenderness, left CVA tenderness, guarding or rebound.  Musculoskeletal:        General: Normal range of motion.     Cervical back: Neck supple. No rigidity or tenderness.     Right lower leg: No edema.     Left lower leg: No edema.  Lymphadenopathy:     Cervical: No cervical adenopathy.  Skin:    General: Skin is warm and dry.  Neurological:     General: No focal deficit present.     Mental Status: She is alert and oriented to person, place, and time.     Cranial Nerves: No cranial nerve deficit.     Sensory: No sensory deficit.     Motor: No weakness.     Coordination: Coordination normal.     Gait: Gait normal.     Deep Tendon Reflexes: Reflexes normal.  Psychiatric:        Mood and Affect: Mood normal.        Behavior: Behavior normal.        Thought Content: Thought content normal.        Judgment: Judgment normal.     Labs reviewed: Basic Metabolic Panel: Recent Labs    09/13/19 1430 09/14/19 0459  NA 139 141  K 4.2 3.5  CL 101 106  CO2 27 25  GLUCOSE 306* 196*  BUN 9 8  CREATININE 0.64 0.65  CALCIUM 9.2 8.3*  MG  --  1.8   Liver Function Tests: Recent Labs    09/14/19 0459  AST 14*  ALT 16  ALKPHOS 31*  BILITOT 0.6  PROT 5.4*  ALBUMIN 2.7*   No results for input(s): LIPASE, AMYLASE in the last 8760 hours. No results for input(s): AMMONIA in the last 8760 hours. CBC: Recent Labs    09/13/19 1430 09/14/19 0459  WBC 8.9 10.5  NEUTROABS  --  6.0  HGB 15.3* 13.6  HCT 47.6* 41.5  MCV 94.6 93.3  PLT 372 334   Cardiac  Enzymes: No results for input(s): CKTOTAL, CKMB, CKMBINDEX, TROPONINI in the last 8760 hours. BNP: Invalid input(s): POCBNP Lab Results  Component Value Date   HGBA1C 13.0 (H) 09/13/2019   No results found for: TSH No results found for: VITAMINB12 No results found for: FOLATE No results found for: IRON, TIBC, FERRITIN  Imaging and Procedures noted on new patient packet: 2021 CT head and EKG at ED (in system here)  Assessment/Plan 1. Uncontrolled type 2 diabetes mellitus with hyperglycemia ([place]) - new onset Feb Lab Results  Component Value Date   HGBA1C 13.0 (H) 09/13/2019  -  Hemoglobin A1c; Future - Continuous Blood Gluc Receiver (FREESTYLE LIBRE 14 DAY READER) DEVI; 1 Units by Does not apply route 2 (two) times daily. H35.30, E11.65  Dispense: 1 each; Refill: 2 - Continuous Blood Gluc Sensor (FREESTYLE LIBRE 14 DAY SENSOR) MISC; 2 Units by Does not apply route in the morning and at bedtime. E11.65, H35.30  Dispense: 2 each; Refill: 2 - continue metformin 500mg  po bid with meals and start trulicity injections weekly which she was taught how to do by nutrition [facility] -f/u hba1c 3 mos from initial and before I see her next -call if any problems -educated about expected effects of trulicity like early satiety, some initial nausea  2. Essential hypertension -bp at goal, not on meds - Basic metabolic panel; Future  3. Macular degeneration of both eyes, unspecified type - she requests freestyle libre b/c she cannot see well to stick her finger - Continuous Blood Gluc Receiver (FREESTYLE LIBRE 14 DAY READER) DEVI; 1 Units by Does not apply route 2 (two) times daily. H35.30, E11.65  Dispense: 1 each; Refill: 2 - Continuous Blood Gluc Sensor (FREESTYLE LIBRE 14 DAY SENSOR) MISC; 2 Units by Does not apply route in the morning and at bedtime. E11.65, H35.30  Dispense: 2 each; Refill: 2  4. Lumbar paraspinal muscle spasm -some chronic difficulty with this, use heat,  tylenol, stretching  5. Stress incontinence -mild, uses the bathroom regularly to avoid, no urge incontinence  6. Screening, lipid - not on meds and lipids not checked with labs during hospitalization - Lipid panel; Future  Labs/tests ordered:   Lab Orders     Basic metabolic panel     Hemoglobin A1c     Lipid panel   Chief Walkup L. Kamia Insalaco, D.O. [facility] [address], [place] [address] Cell Phone (Mon-Fri 8am-5pm):  [phone] On Call:  [phone] & follow prompts after 5pm & weekends Office Phone:  [phone] Office Fax:  [phone]

## 2019-10-06 NOTE — Patient Instructions (Addendum)
Dr. Ronnette Juniper specialist  Start the trulicity weekly. We'll recheck on your sugar average and electrolytes in May and I will see you We'll try to get you the freestyle libre so you can check your sugar fasting and then after each meal.  If we cannot get that due to cost, I'd like you to try to check your sugar fasting in the morning and once a week after a meal.    Please follow-up with podiatry and your eye doctor.

## 2019-10-07 MED ORDER — FREESTYLE LIBRE 14 DAY SENSOR MISC: 2.0000 [IU] | Freq: Two times a day (BID) | 2 refills | Status: DC

## 2019-10-07 MED ORDER — FREESTYLE LIBRE 14 DAY READER DEVI: 1.0000 [IU] | Freq: Two times a day (BID) | 2 refills | Status: DC

## 2019-10-10 ENCOUNTER — Telehealth: Payer: Self-pay

## 2019-10-10 DIAGNOSIS — E1165 Type 2 diabetes mellitus with hyperglycemia: Secondary | ICD-10-CM

## 2019-10-10 NOTE — Telephone Encounter (Signed)
Looks like one touch ultra 2 is a current meter.  She will need it and a box of 100 test strips and 100 lancets for her uncontrolled diabetes.  I recommend she check her sugar first thing in the morning fasting twice a week and record the results to bring along to her next visit.  If they are staying up over 200 consistently, she should call us back asap.

## 2019-10-10 NOTE — Telephone Encounter (Signed)
Patient received results and was very appreciative of Dr. Mariea Clonts trying to get what she needed. [name] said she does not have anything to check her sugar with at this time and would like to know the next steps. Please advise.

## 2019-10-11 DIAGNOSIS — R69 Illness, unspecified: Secondary | ICD-10-CM | POA: Diagnosis not present

## 2019-10-11 MED ORDER — ONETOUCH ULTRA 2 W/DEVICE KIT: 1.0000 | PACK | Freq: Every day | 2 refills | Status: DC

## 2019-10-11 NOTE — Telephone Encounter (Signed)
   Message given to patient and device has been ordered. looks like one touch ultra 2 is a current meter.  She will need it and a box of 100 test strips and 100 lancets for her uncontrolled diabetes.  I recommend she check her sugar first thing in the morning fasting twice a week and record the results to bring along to her next visit.  If they are staying up over 200 consistently, she should call us back asap.

## 2019-11-04 ENCOUNTER — Ambulatory Visit (INDEPENDENT_AMBULATORY_CARE_PROVIDER_SITE_OTHER): Payer: Medicare HMO | Admitting: Family

## 2019-11-04 ENCOUNTER — Other Ambulatory Visit: Payer: Self-pay

## 2019-11-04 ENCOUNTER — Encounter: Payer: Self-pay | Admitting: Family

## 2019-11-04 VITALS — BP 132/78 | HR 92 | Temp 97.7°F | Ht 60.0 in | Wt 147.0 lb

## 2019-11-04 DIAGNOSIS — R05 Cough: Secondary | ICD-10-CM

## 2019-11-04 DIAGNOSIS — N3001 Acute cystitis with hematuria: Secondary | ICD-10-CM | POA: Diagnosis not present

## 2019-11-04 DIAGNOSIS — R059 Cough, unspecified: Secondary | ICD-10-CM

## 2019-11-04 DIAGNOSIS — R300 Dysuria: Secondary | ICD-10-CM

## 2019-11-04 LAB — POCT URINALYSIS DIPSTICK
Bilirubin, UA: NEGATIVE
Glucose, UA: NEGATIVE
Ketones, UA: NEGATIVE
Nitrite, UA: NEGATIVE
Protein, UA: POSITIVE — AB
Spec Grav, UA: 1.01 (ref 1.010–1.025)
Urobilinogen, UA: 0.2 E.U./dL
pH, UA: 6 (ref 5.0–8.0)

## 2019-11-04 MED ORDER — SACCHAROMYCES BOULARDII 250 MG PO CAPS: 250.0000 mg | ORAL_CAPSULE | Freq: Two times a day (BID) | ORAL | 0 refills | Status: AC

## 2019-11-04 MED ORDER — CIPROFLOXACIN HCL 500 MG PO TABS: 500.0000 mg | ORAL_TABLET | Freq: Two times a day (BID) | ORAL | 0 refills | Status: AC

## 2019-11-04 NOTE — Progress Notes (Addendum)
Provider: Jazira Maloney FNP-C  Gayland Curry, DO  Patient Care Team: Gayland Curry, DO as PCP - General (Geriatric Medicine)  Extended Emergency Contact Information Primary Emergency Contact: Theador Hawthorne [place] Phone: [phone] Mobile Phone: [phone] Relation: Daughter  Code Status: Full code  Goals of care: Advanced Directive information Advanced Directives 11/04/2019  Does Patient Have a Medical Advance Directive? -  Type of Paramedic of [place];Living will  Does patient want to make changes to medical advance directive? No - Patient declined  Copy of [place] in Chart? Yes - validated most recent copy scanned in chart (See row information)  Would patient like information on creating a medical advance directive? -     Chief Complaint  Patient presents with  . Acute Visit    Possible UTI and cough since Wednesday    HPI:  Pt is a 84 y.o. female seen today for an acute visit for evaluation of burning sensation with urination x 1 day.she denies any urine urgency but states has to urinate frequent but she drinks lots of water and usually pees frequent.she had burning sensation this morning around 4 am and has progressed the whole day.she denies any fever,chills,abdominal  Or back pain. Also denies any nausea or vomiting.No changes in appetite.  Also complains of cough x three days.states had bronchitis since was rained on recently with the daughter.she has not gotten better since then.she describes cough as non-productive.she denies any shortness of breath.states has chronic wheezes due to COPD.states smoked cigarettes several years ago.she has had her two doses of COVID-19 vaccine. No contact with sick person with COVID-19.     Past Medical History:  Diagnosis Date  . Allergy    Past Surgical History:  Procedure Laterality Date  . TUBAL LIGATION Bilateral     Allergies  Allergen Reactions  .  Codeine Other (See Comments)    Causes lip burning and hyperactivity    Outpatient Encounter Medications as of 11/04/2019  Medication Sig  . Blood Glucose Monitoring Suppl (ONE TOUCH ULTRA 2) w/Device KIT 1 Device by Does not apply route daily before breakfast. check her sugar first thing in the morning fasting twice a week and record the results to bring along to her next visit.  . Continuous Blood Gluc Receiver (FREESTYLE LIBRE 14 DAY READER) DEVI 1 Units by Does not apply route 2 (two) times daily. H35.30, E11.65  . Continuous Blood Gluc Sensor (FREESTYLE LIBRE 14 DAY SENSOR) MISC 2 Units by Does not apply route in the morning and at bedtime. E11.65, H35.30  . Dulaglutide (TRULICITY) 9.98 PJ/8.2NK SOPN Inject 0.75 mg into the skin once a week.  . famotidine (PEPCID) 10 MG tablet Take 10 mg by mouth 2 (two) times daily as needed for heartburn or indigestion.  . fexofenadine (ALLEGRA) 60 MG tablet Take 60 mg by mouth daily.  . Lancets (ONETOUCH ULTRASOFT) lancets by Other route as needed for other. Use as instructed  . metFORMIN (GLUCOPHAGE XR) 500 MG 24 hr tablet Take 1 tablet (500 mg total) by mouth 2 (two) times daily with a meal.  . [DISCONTINUED] cetirizine (ZYRTEC) 10 MG tablet Take 10 mg by mouth daily as needed for allergies.  . [DISCONTINUED] ranitidine (ZANTAC) 150 MG tablet Take 150 mg by mouth daily as needed for heartburn.   No facility-administered encounter medications on file as of 11/04/2019.    Review of Systems  Constitutional: Negative for chills and fatigue.  Respiratory:  Negative for chest tightness and shortness of breath.        Chronic cough   Cardiovascular: Negative for chest pain, palpitations and leg swelling.  Gastrointestinal: Negative for abdominal distention, abdominal pain, constipation, diarrhea, nausea and vomiting.  Genitourinary: Positive for frequency. Negative for decreased urine volume, difficulty urinating, flank pain and urgency.  Musculoskeletal:  Positive for gait problem. Negative for myalgias.  Skin: Negative for color change, pallor and rash.    Immunization History  Administered Date(s) Administered  . Influenza, High Dose Seasonal PF 06/10/2019  . Tdap 10/05/2018   Pertinent  Health Maintenance Due  Topic Date Due  . OPHTHALMOLOGY EXAM  09/19/2019  . URINE MICROALBUMIN  11/06/2019 (Originally 03/21/1946)  . DEXA SCAN  10/05/2020 (Originally 03/22/2003)  . PNA vac Low Risk Adult (1 of 2 - PCV13) 10/05/2020 (Originally 03/22/2003)  . INFLUENZA VACCINE  02/19/2020  . HEMOGLOBIN A1C  03/12/2020  . FOOT EXAM  09/12/2020   Fall Risk  11/04/2019 10/06/2019 09/21/2019  Falls in the past year? 0 0 0  Number falls in past yr: 0 0 0  Injury with Fall? 0 0 0    Vitals:   11/04/19 1318  BP: 132/78  Pulse: 92  Temp: 97.7 F (36.5 C)  TempSrc: Temporal  SpO2: 97%  Weight: 147 lb (66.7 kg)  Height: 5' (1.524 m)   Body mass index is 28.71 kg/m. Physical Exam Vitals reviewed.  Constitutional:      General: She is not in acute distress.    Appearance: She is overweight. She is not ill-appearing.  HENT:     Head: Normocephalic.     Right Ear: Tympanic membrane, ear canal and external ear normal. There is no impacted cerumen.     Left Ear: Tympanic membrane, ear canal and external ear normal. There is no impacted cerumen.     Nose: Nose normal. No congestion.     Mouth/Throat:     Mouth: Mucous membranes are moist.     Pharynx: Oropharynx is clear. No oropharyngeal exudate or posterior oropharyngeal erythema.  Eyes:     General: No scleral icterus.       Right eye: No discharge.        Left eye: No discharge.     Conjunctiva/sclera: Conjunctivae normal.     Pupils: Pupils are equal, round, and reactive to light.  Cardiovascular:     Rate and Rhythm: Normal rate and regular rhythm.     Pulses: Normal pulses.     Heart sounds: Normal heart sounds. No murmur. No friction rub. No gallop.   Pulmonary:     Effort: Pulmonary  effort is normal. No respiratory distress.     Breath sounds: No rhonchi or rales.     Comments: Scattered diminished bilateral wheezing noted with expiration.  Chest:     Chest wall: No tenderness.  Abdominal:     General: Bowel sounds are normal. There is no distension.     Palpations: Abdomen is soft. There is no mass.     Tenderness: There is no abdominal tenderness. There is no right CVA tenderness, left CVA tenderness, guarding or rebound.  Musculoskeletal:        General: No swelling or tenderness.     Right lower leg: No edema.     Left lower leg: No edema.     Comments: Unsteady gait walks with right hand cane   Skin:    General: Skin is warm.     Coloration: Skin is  not pale.     Findings: No bruising, erythema or rash.  Neurological:     Mental Status: She is alert and oriented to person, place, and time.     Motor: No weakness.     Gait: Gait abnormal.  Psychiatric:        Mood and Affect: Mood normal.        Behavior: Behavior normal.        Thought Content: Thought content normal.        Judgment: Judgment normal.    Labs reviewed: Recent Labs    09/13/19 1430 09/14/19 0459  NA 139 141  K 4.2 3.5  CL 101 106  CO2 27 25  GLUCOSE 306* 196*  BUN 9 8  CREATININE 0.64 0.65  CALCIUM 9.2 8.3*  MG  --  1.8   Recent Labs    09/14/19 0459  AST 14*  ALT 16  ALKPHOS 31*  BILITOT 0.6  PROT 5.4*  ALBUMIN 2.7*   Recent Labs    09/13/19 1430 09/14/19 0459  WBC 8.9 10.5  NEUTROABS  --  6.0  HGB 15.3* 13.6  HCT 47.6* 41.5  MCV 94.6 93.3  PLT 372 334   No results found for: TSH Lab Results  Component Value Date   HGBA1C 13.0 (H) 09/13/2019   No results found for: CHOL, HDL, LDLCALC, LDLDIRECT, TRIG, CHOLHDL  Significant Diagnostic Results in last 30 days:  No results found.  Assessment/Plan 1. Acute cystitis with hematuria Afebrile. - Urine dip stick indicates Moderate amounts of leukocytes 3+,negative nitrites,Blood 3+ ,Protein 100++ discussed  results with patient.recommended culturing urine.Patient prefers to have antibiotic ordered today being a Friday afternoon and results won't be back until Monday.she understand if culture and sensitivity results comes back we might need to change the antibiotics.  Will start on Cipro sides effects discussed.Advised to complete course and take along with a Probiotic to prevent diarrhea associated with antibiotics.    - ciprofloxacin (CIPRO) 500 MG tablet; Take 1 tablet (500 mg total) by mouth 2 (two) times daily for 7 days.  Dispense: 14 tablet; Refill: 0 - saccharomyces boulardii (FLORASTOR) 250 MG capsule; Take 1 capsule (250 mg total) by mouth 2 (two) times daily for 10 days.  Dispense: 20 capsule; Refill: 0  2. Cough Bilateral lung diminished wheezes noted.recommeded chest X-ray and Albuterol inhaler but patient has declined.No shortness of breath noted. Advised to notify provider if symptoms worsen develops shortness of breath  or running any fever.Verbalized understanding.   Family/ staff Communication: Reviewed plan of care with patient Verbalized understanding.   Labs/tests ordered:  - POC urinalysis dipstick - Urine culture   Next Appointment: Has upcoming appointment with Dr.Reed 12/15/2019   Sandrea Hughs, NP

## 2019-11-04 NOTE — Patient Instructions (Signed)
-   take Cipro 500 mg tablet one by mouth twice daily x 7 days for urinary tract infections. Please take along with a Probiotic twice daily x 10 days to prevent antibiotic associated diarrhea. - Increase water intake - Notify provider if running any fever or symptoms worsen

## 2019-11-06 LAB — URINE CULTURE
MICRO NUMBER:: 10374981
SPECIMEN QUALITY:: ADEQUATE

## 2019-11-11 NOTE — Addendum Note (Signed)
Addended by: Despina Hidden on: 11/11/2019 11:47 AM   Modules accepted: Orders

## 2019-12-12 ENCOUNTER — Other Ambulatory Visit: Payer: Self-pay

## 2019-12-12 ENCOUNTER — Other Ambulatory Visit: Payer: Medicare HMO

## 2019-12-12 DIAGNOSIS — E1165 Type 2 diabetes mellitus with hyperglycemia: Secondary | ICD-10-CM

## 2019-12-12 DIAGNOSIS — Z13220 Encounter for screening for lipoid disorders: Secondary | ICD-10-CM | POA: Diagnosis not present

## 2019-12-12 DIAGNOSIS — I10 Essential (primary) hypertension: Secondary | ICD-10-CM | POA: Diagnosis not present

## 2019-12-13 LAB — HEMOGLOBIN A1C
Hgb A1c MFr Bld: 8.4 % of total Hgb — ABNORMAL HIGH (ref <5.7)
Mean Plasma Glucose: 194 (calc)
eAG (mmol/L): 10.8 (calc)

## 2019-12-13 LAB — BASIC METABOLIC PANEL
BUN: 17 mg/dL (ref 7–25)
CO2: 29 mmol/L (ref 20–32)
Calcium: 10 mg/dL (ref 8.6–10.4)
Chloride: 101 mmol/L (ref 98–110)
Creat: 0.83 mg/dL (ref 0.60–0.88)
Glucose, Bld: 162 mg/dL — ABNORMAL HIGH (ref 65–99)
Potassium: 4.9 mmol/L (ref 3.5–5.3)
Sodium: 139 mmol/L (ref 135–146)

## 2019-12-13 LAB — LIPID PANEL
Cholesterol: 154 mg/dL (ref <200)
HDL: 47 mg/dL — ABNORMAL LOW (ref >=50)
LDL Cholesterol (Calc): 82 mg/dL (calc)
Non-HDL Cholesterol (Calc): 107 mg/dL (calc) (ref <130)
Total CHOL/HDL Ratio: 3.3 (calc) (ref <5.0)
Triglycerides: 150 mg/dL — ABNORMAL HIGH (ref <150)

## 2019-12-13 NOTE — Progress Notes (Signed)
Cholesterol is ok. Sugar average has improved dramatically from 13 to 8.4.  we can figure out next steps at her appt. Electrolytes were ok.  Kidney function slightly declined.

## 2019-12-15 ENCOUNTER — Other Ambulatory Visit: Payer: Self-pay

## 2019-12-15 ENCOUNTER — Encounter: Payer: Self-pay | Admitting: Internal Medicine

## 2019-12-15 ENCOUNTER — Ambulatory Visit (INDEPENDENT_AMBULATORY_CARE_PROVIDER_SITE_OTHER): Payer: Medicare HMO | Admitting: Internal Medicine

## 2019-12-15 VITALS — BP 134/82 | HR 68 | Temp 97.5°F | Ht 60.0 in | Wt 146.4 lb

## 2019-12-15 DIAGNOSIS — E785 Hyperlipidemia, unspecified: Secondary | ICD-10-CM | POA: Diagnosis not present

## 2019-12-15 DIAGNOSIS — E1169 Type 2 diabetes mellitus with other specified complication: Secondary | ICD-10-CM | POA: Diagnosis not present

## 2019-12-15 DIAGNOSIS — I10 Essential (primary) hypertension: Secondary | ICD-10-CM

## 2019-12-15 DIAGNOSIS — L602 Onychogryphosis: Secondary | ICD-10-CM | POA: Diagnosis not present

## 2019-12-15 DIAGNOSIS — E1165 Type 2 diabetes mellitus with hyperglycemia: Secondary | ICD-10-CM | POA: Diagnosis not present

## 2019-12-15 NOTE — Patient Instructions (Signed)
Call and let us know when you had your covid vaccines

## 2019-12-15 NOTE — Progress Notes (Signed)
Location:  [facility] clinic Provider:  Shaaron Golliday L. Mariea Clonts, D.O., C.M.D.  Goals of Care:  Advanced Directives 12/15/2019  Does Patient Have a Medical Advance Directive? Yes  Type of Advance Directive [place]  Does patient want to make changes to medical advance directive? No - Patient declined  Copy of [place] in Chart? No - copy requested  Would patient like information on creating a medical advance directive? -     Chief Complaint  Patient presents with  . Medical Management of Chronic Issues    follow up  for diebetes / labs results given     HPI: Patient is a 85 y.o. female seen today for medical management of chronic diseases.    She is here to f/u on her diabetes.  Her hba1c was a lot better at 8.4 vs 13 3 mos ago.   CBGs have ranged from 141-162 in the am the past two weeks.  She has a freestyle libre now.   She is on trulicity -8.00LK weekly on wednesdays, metformin 541m po bid.   She's been doing ok.  She's been very careful what she eats.  She's going to fuss about her daughter--she brings her husband who is diabetic food he should not eat.  She will only eat a little potatoes if her daughter makes rice and potatoes.  She likes her veggies.  She did have a few times where she drank a milkshake and thought her sugar would then go through the roof.  She asks about a small glass of wine with her friends at dinner on occasion.  Her late husband was an alcoholic so she did not drink in front of him b/c he was sober for 474yr    She is carrying her cane--Dr. PhHardin Negusold her to carry it with her.  If spasms catch her in her back it steadies her.    She had a UTI that was treated with cipro--had been E coli.    She has some soreness in her back from picking up her 28lb grandson.  Reviewed cholesterol--does limit her cheese, has milkshakes occasionally.  They have steak every Friday--will have small piece split with her daughter.  Prefers  chicken and fish.  Doesn't do fried food.  No other desserts.  She resisted her favorite key lime pie last night.    She is going to call [facility] to get in for toenail trimming.  Weight is staying down--she's 146 lbs and was 147 lbs last month.  Past Medical History:  Diagnosis Date  . Allergy     Past Surgical History:  Procedure Laterality Date  . TUBAL LIGATION Bilateral     Allergies  Allergen Reactions  . Codeine Other (See Comments)    Causes lip burning and hyperactivity    Outpatient Encounter Medications as of 12/15/2019  Medication Sig  . Continuous Blood Gluc Receiver (FREESTYLE LIBRE 14 DAY READER) DEVI 1 Units by Does not apply route 2 (two) times daily. H35.30, E11.65  . Continuous Blood Gluc Sensor (FREESTYLE LIBRE 14 DAY SENSOR) MISC 2 Units by Does not apply route in the morning and at bedtime. E11.65, H35.30  . Dulaglutide (TRULICITY) 0.9.17GHX/5.0VWOPN Inject 0.75 mg into the skin once a week.  . famotidine (PEPCID) 10 MG tablet Take 10 mg by mouth 2 (two) times daily as needed for heartburn or indigestion.  . fexofenadine (ALLEGRA) 60 MG tablet Take 60 mg by mouth daily.  . metFORMIN (GLUCOPHAGE  XR) 500 MG 24 hr tablet Take 1 tablet (500 mg total) by mouth 2 (two) times daily with a meal.  . [DISCONTINUED] Blood Glucose Monitoring Suppl (ONE TOUCH ULTRA 2) w/Device KIT 1 Device by Does not apply route daily before breakfast. check her sugar first thing in the morning fasting twice a week and record the results to bring along to her next visit.  . [DISCONTINUED] cetirizine (ZYRTEC) 10 MG tablet Take 10 mg by mouth daily as needed for allergies.  . [DISCONTINUED] Lancets (ONETOUCH ULTRASOFT) lancets by Other route as needed for other. Use as instructed  . [DISCONTINUED] ranitidine (ZANTAC) 150 MG tablet Take 150 mg by mouth daily as needed for heartburn.   No facility-administered encounter medications on file as of 12/15/2019.    Review of Systems:  Review  of Systems  Constitutional: Negative for chills, fever and malaise/fatigue.  HENT: Negative for congestion and sore throat.   Eyes: Negative for blurred vision.  Respiratory: Negative for cough and shortness of breath.   Cardiovascular: Negative for chest pain, palpitations and leg swelling.  Gastrointestinal: Negative for abdominal pain, blood in stool, constipation, diarrhea and melena.  Genitourinary: Positive for frequency and urgency. Negative for dysuria.  Musculoskeletal: Positive for back pain and joint pain. Negative for falls.       Uses cane  Skin: Negative for itching and rash.  Neurological: Negative for dizziness and loss of consciousness.  Endo/Heme/Allergies: Negative for polydipsia. Bruises/bleeds easily.  Psychiatric/Behavioral: Positive for memory loss. Negative for depression. The patient is not nervous/anxious and does not have insomnia.        [facility]    Health Maintenance  Topic Date Due  . URINE MICROALBUMIN  Never done  . COVID-19 Vaccine (1) Never done  . DEXA SCAN  10/05/2020 (Originally 03/22/2003)  . PNA vac Low Risk Adult (1 of 2 - PCV13) 10/05/2020 (Originally 03/22/2003)  . INFLUENZA VACCINE  02/19/2020  . HEMOGLOBIN A1C  06/13/2020  . OPHTHALMOLOGY EXAM  06/20/2020  . FOOT EXAM  09/12/2020  . TETANUS/TDAP  10/04/2028    Physical Exam: Vitals:   12/15/19 1429  BP: 134/82  Pulse: 68  Temp: (!) 97.5 F (36.4 C)  TempSrc: Tympanic  SpO2: 97%  Weight: 146 lb 6.4 oz (66.4 kg)  Height: 5' (1.524 m)   Body mass index is 28.59 kg/m. Physical Exam Vitals reviewed.  Constitutional:      General: She is not in acute distress.    Appearance: Normal appearance. She is not toxic-appearing.  HENT:     Head: Normocephalic and atraumatic.  Cardiovascular:     Rate and Rhythm: Normal rate and regular rhythm.     Pulses: Normal pulses.     Heart sounds: Normal heart sounds.  Pulmonary:     Effort: Pulmonary effort is normal.     Breath sounds: Normal  breath sounds. No wheezing, rhonchi or rales.  Abdominal:     General: Bowel sounds are normal.     Palpations: Abdomen is soft.  Musculoskeletal:        General: Normal range of motion.     Right lower leg: No edema.     Left lower leg: No edema.  Skin:    General: Skin is warm and dry.  Neurological:     General: No focal deficit present.     Mental Status: She is alert and oriented to person, place, and time. Mental status is at baseline.     Cranial Nerves: No cranial nerve  deficit.     Motor: No weakness.     Coordination: Coordination normal.     Comments: Uses large wooden walking cane  Psychiatric:        Mood and Affect: Mood normal.        Behavior: Behavior normal.     Labs reviewed: Basic Metabolic Panel: Recent Labs    09/13/19 1430 09/14/19 0459 12/12/19 1009  NA 139 141 139  K 4.2 3.5 4.9  CL 101 106 101  CO2 27 25 29   GLUCOSE 306* 196* 162*  BUN 9 8 17   CREATININE 0.64 0.65 0.83  CALCIUM 9.2 8.3* 10.0  MG  --  1.8  --    Liver Function Tests: Recent Labs    09/14/19 0459  AST 14*  ALT 16  ALKPHOS 31*  BILITOT 0.6  PROT 5.4*  ALBUMIN 2.7*   No results for input(s): LIPASE, AMYLASE in the last 8760 hours. No results for input(s): AMMONIA in the last 8760 hours. CBC: Recent Labs    09/13/19 1430 09/14/19 0459  WBC 8.9 10.5  NEUTROABS  --  6.0  HGB 15.3* 13.6  HCT 47.6* 41.5  MCV 94.6 93.3  PLT 372 334   Lipid Panel: Recent Labs    12/12/19 1009  CHOL 154  HDL 47*  LDLCALC 82  TRIG 150*  CHOLHDL 3.3   Lab Results  Component Value Date   HGBA1C 8.4 (H) 12/12/2019    Assessment/Plan 1. Uncontrolled type 2 diabetes mellitus with hyperglycemia (HCC) -much improved and this 3 month avg of 8.4 does not include a full 3 mos of trulicity (just 2) so expect next one will be even better with metformin and trulicity plus her incredibly improved diet--encouraged this and regular exercise - Microalbumin / creatinine urine ratio;  Future - Hemoglobin A1c; Future - Basic metabolic panel; Future  2. Essential hypertension - bp at goal with current regimen, cont same, check urine micro next lab - Basic metabolic panel; Future  3. Overgrown toenails -she did not want a referral but intends to call [facility] for an appt  4. Hyperlipidemia associated with type 2 diabetes mellitus ([place]) -some elevation of LDL -guidelines indicate she should be on a statin with her diabetes--she is still adjusting to current meds and LDL 82--counseled on some dietary changes to try and we can reassess  Labs/tests ordered:   Lab Orders     Microalbumin / creatinine urine ratio     Hemoglobin A1c     Basic metabolic panel  Next appt:  04/16/2020    Katalin Colledge L. Maejor Erven, D.O. [facility] [address], [place] [address] Cell Phone (Mon-Fri 8am-5pm):  [phone] On Call:  [phone] & follow prompts after 5pm & weekends Office Phone:  [phone] Office Fax:  [phone]

## 2019-12-20 ENCOUNTER — Other Ambulatory Visit: Payer: Self-pay | Admitting: Internal Medicine

## 2019-12-20 DIAGNOSIS — E11319 Type 2 diabetes mellitus with unspecified diabetic retinopathy without macular edema: Secondary | ICD-10-CM

## 2020-01-05 ENCOUNTER — Other Ambulatory Visit: Payer: Self-pay | Admitting: *Deleted

## 2020-01-05 ENCOUNTER — Other Ambulatory Visit: Payer: Self-pay | Admitting: Internal Medicine

## 2020-01-05 DIAGNOSIS — E11319 Type 2 diabetes mellitus with unspecified diabetic retinopathy without macular edema: Secondary | ICD-10-CM

## 2020-01-05 MED ORDER — METFORMIN HCL ER 500 MG PO TB24: 500.0000 mg | ORAL_TABLET | Freq: Two times a day (BID) | ORAL | 1 refills | Status: DC

## 2020-01-05 NOTE — Telephone Encounter (Signed)
Patient requested refill

## 2020-01-08 ENCOUNTER — Other Ambulatory Visit: Payer: Self-pay | Admitting: Internal Medicine

## 2020-01-08 DIAGNOSIS — E11319 Type 2 diabetes mellitus with unspecified diabetic retinopathy without macular edema: Secondary | ICD-10-CM

## 2020-02-09 ENCOUNTER — Other Ambulatory Visit: Payer: Self-pay | Admitting: Internal Medicine

## 2020-02-09 DIAGNOSIS — E11319 Type 2 diabetes mellitus with unspecified diabetic retinopathy without macular edema: Secondary | ICD-10-CM

## 2020-02-11 ENCOUNTER — Other Ambulatory Visit: Payer: Self-pay | Admitting: Internal Medicine

## 2020-02-11 DIAGNOSIS — E11319 Type 2 diabetes mellitus with unspecified diabetic retinopathy without macular edema: Secondary | ICD-10-CM

## 2020-02-14 ENCOUNTER — Other Ambulatory Visit: Payer: Self-pay | Admitting: Internal Medicine

## 2020-02-14 DIAGNOSIS — E11319 Type 2 diabetes mellitus with unspecified diabetic retinopathy without macular edema: Secondary | ICD-10-CM

## 2020-03-23 DIAGNOSIS — G3184 Mild cognitive impairment, so stated: Secondary | ICD-10-CM | POA: Diagnosis not present

## 2020-03-23 DIAGNOSIS — E1151 Type 2 diabetes mellitus with diabetic peripheral angiopathy without gangrene: Secondary | ICD-10-CM | POA: Diagnosis not present

## 2020-03-23 DIAGNOSIS — Z79899 Other long term (current) drug therapy: Secondary | ICD-10-CM | POA: Diagnosis not present

## 2020-03-23 DIAGNOSIS — H5360 Unspecified night blindness: Secondary | ICD-10-CM | POA: Diagnosis not present

## 2020-03-23 DIAGNOSIS — R030 Elevated blood-pressure reading, without diagnosis of hypertension: Secondary | ICD-10-CM | POA: Diagnosis not present

## 2020-03-23 DIAGNOSIS — Z7984 Long term (current) use of oral hypoglycemic drugs: Secondary | ICD-10-CM | POA: Diagnosis not present

## 2020-03-23 DIAGNOSIS — K219 Gastro-esophageal reflux disease without esophagitis: Secondary | ICD-10-CM | POA: Diagnosis not present

## 2020-03-23 DIAGNOSIS — R269 Unspecified abnormalities of gait and mobility: Secondary | ICD-10-CM | POA: Diagnosis not present

## 2020-03-23 DIAGNOSIS — Z7722 Contact with and (suspected) exposure to environmental tobacco smoke (acute) (chronic): Secondary | ICD-10-CM | POA: Diagnosis not present

## 2020-03-23 DIAGNOSIS — Z803 Family history of malignant neoplasm of breast: Secondary | ICD-10-CM | POA: Diagnosis not present

## 2020-04-10 ENCOUNTER — Ambulatory Visit (INDEPENDENT_AMBULATORY_CARE_PROVIDER_SITE_OTHER): Payer: Medicare HMO | Admitting: Family

## 2020-04-10 ENCOUNTER — Other Ambulatory Visit: Payer: Self-pay | Admitting: Internal Medicine

## 2020-04-10 ENCOUNTER — Other Ambulatory Visit: Payer: Self-pay

## 2020-04-10 ENCOUNTER — Other Ambulatory Visit: Payer: Medicare HMO

## 2020-04-10 ENCOUNTER — Encounter: Payer: Self-pay | Admitting: Family

## 2020-04-10 ENCOUNTER — Ambulatory Visit: Payer: Medicare HMO | Admitting: Family

## 2020-04-10 VITALS — BP 130/66 | HR 86 | Temp 97.3°F | Resp 16 | Ht 60.0 in | Wt 144.8 lb

## 2020-04-10 DIAGNOSIS — E1165 Type 2 diabetes mellitus with hyperglycemia: Secondary | ICD-10-CM

## 2020-04-10 DIAGNOSIS — I10 Essential (primary) hypertension: Secondary | ICD-10-CM

## 2020-04-10 DIAGNOSIS — G629 Polyneuropathy, unspecified: Secondary | ICD-10-CM

## 2020-04-10 DIAGNOSIS — H3530 Unspecified macular degeneration: Secondary | ICD-10-CM

## 2020-04-10 MED ORDER — GABAPENTIN 100 MG PO CAPS: 100.0000 mg | ORAL_CAPSULE | Freq: Three times a day (TID) | ORAL | 3 refills | Status: DC

## 2020-04-10 MED ORDER — GABAPENTIN 100 MG PO CAPS: 100.0000 mg | ORAL_CAPSULE | Freq: Every day | ORAL | 0 refills | Status: DC

## 2020-04-10 NOTE — Progress Notes (Signed)
Provider: Dorothymae Maciver FNP-C  Gayland Curry, DO  Patient Care Team: Gayland Curry, DO as PCP - General (Geriatric Medicine)  Extended Emergency Contact Information Primary Emergency Contact: Theador Hawthorne [place] Phone: [phone] Mobile Phone: [phone] Relation: Daughter  Code Status: Full Code  Goals of care: Advanced Directive information Advanced Directives 04/10/2020  Does Patient Have a Medical Advance Directive? Yes  Type of Advance Directive Living will;Healthcare Power of Attorney  Does patient want to make changes to medical advance directive? No - Patient declined  Copy of [place] in Chart? No - copy requested  Would patient like information on creating a medical advance directive? No - Patient declined     Chief Complaint  Patient presents with  . Acute Visit    Complains of pain in right thigh, sensative to touch, and visble knots.     HPI:  Pt is a 84 y.o. female seen today for an acute visit for evaluation of burning sensation,painful x 2 days ago.she tried to put on pants and couldn't stand pants touching the inner thigh. She denies fever,chills,or drainage.Has not had her shingles vaccine states a friend went to her pharmacy and was told one shingles vaccine was $ 200. Also complained burning sensation on both legs worst at night.states runs in the family her brother has it,parents and grandparents had it too.Denies symptoms of restless leg syndrome.Of note,she has a medical history of Type 2 DM but does not think it's related to her Diabetes.she checks her blood sugars using Freestyle libre readings in the mornings are 150's - 160's.  Discussed use of Gabapentin to alleviate he r symptoms states that's what her brother takes.states might need to take at bedtime or as needed.   Past Medical History:  Diagnosis Date  . Allergy    Past Surgical History:  Procedure Laterality Date  . TUBAL LIGATION Bilateral      Allergies  Allergen Reactions  . Codeine Other (See Comments)    Causes lip burning and hyperactivity    Outpatient Encounter Medications as of 04/10/2020  Medication Sig  . Continuous Blood Gluc Receiver (FREESTYLE LIBRE 14 DAY READER) DEVI 1 Units by Does not apply route 2 (two) times daily. H35.30, E11.65  . Continuous Blood Gluc Sensor (FREESTYLE LIBRE 14 DAY SENSOR) MISC 2 Units by Does not apply route in the morning and at bedtime. E11.65, H35.30  . famotidine (PEPCID) 10 MG tablet Take 10 mg by mouth 2 (two) times daily as needed for heartburn or indigestion.  . fexofenadine (ALLEGRA) 60 MG tablet Take 60 mg by mouth daily.  . metFORMIN (GLUCOPHAGE XR) 500 MG 24 hr tablet Take 1 tablet (500 mg total) by mouth 2 (two) times daily with a meal.  . TRULICITY 1.77 LT/9.0ZE SOPN INJECT 0.75 MG INTO THE SKIN ONCE A WEEK.  . [DISCONTINUED] cetirizine (ZYRTEC) 10 MG tablet Take 10 mg by mouth daily as needed for allergies.  . [DISCONTINUED] ranitidine (ZANTAC) 150 MG tablet Take 150 mg by mouth daily as needed for heartburn.   No facility-administered encounter medications on file as of 04/10/2020.    Review of Systems  Constitutional: Negative for appetite change, chills, fatigue and fever.  HENT: Negative for congestion, postnasal drip, rhinorrhea, sinus pressure, sinus pain, sneezing and sore throat.   Eyes: Negative for discharge, redness and itching.  Respiratory: Negative for cough, chest tightness and wheezing.   Cardiovascular: Negative for chest pain, palpitations and leg swelling.  Musculoskeletal:  Positive for gait problem. Negative for joint swelling and myalgias.  Skin: Negative for color change, pallor and rash.  Neurological: Negative for dizziness, speech difficulty, weakness, light-headedness and headaches.       Burning sensation worst at bedtime.  Psychiatric/Behavioral: Negative for agitation, behavioral problems and sleep disturbance. The patient is not  nervous/anxious.     Immunization History  Administered Date(s) Administered  . Influenza, High Dose Seasonal PF 06/10/2019  . Tdap 10/05/2018   Pertinent  Health Maintenance Due  Topic Date Due  . URINE MICROALBUMIN  Never done  . INFLUENZA VACCINE  02/19/2020  . DEXA SCAN  10/05/2020 (Originally 03/22/2003)  . PNA vac Low Risk Adult (1 of 2 - PCV13) 10/05/2020 (Originally 03/22/2003)  . HEMOGLOBIN A1C  06/13/2020  . FOOT EXAM  09/12/2020  . OPHTHALMOLOGY EXAM  02/13/2021   Fall Risk  04/10/2020 12/15/2019 11/04/2019 10/06/2019 09/21/2019  Falls in the past year? 0 - 0 0 0  Number falls in past yr: 0 0 0 0 0  Injury with Fall? 0 0 0 0 0    Vitals:   04/10/20 0913  BP: 130/66  Pulse: 86  Resp: 16  Temp: (!) 97.3 F (36.3 C)  SpO2: 94%  Weight: 144 lb 12.8 oz (65.7 kg)  Height: 5' (1.524 m)   Body mass index is 28.28 kg/m. Physical Exam Vitals reviewed.  Constitutional:      General: She is not in acute distress.    Appearance: She is not ill-appearing.  HENT:     Head: Normocephalic.  Eyes:     General: No scleral icterus.       Right eye: No discharge.        Left eye: No discharge.     Conjunctiva/sclera: Conjunctivae normal.     Pupils: Pupils are equal, round, and reactive to light.     Comments: Corrective lens in place   Cardiovascular:     Rate and Rhythm: Normal rate and regular rhythm.     Pulses: Normal pulses.     Heart sounds: Normal heart sounds. No murmur heard.  No friction rub. No gallop.   Pulmonary:     Effort: Pulmonary effort is normal. No respiratory distress.     Breath sounds: Normal breath sounds. No wheezing, rhonchi or rales.  Chest:     Chest wall: No tenderness.  Abdominal:     General: Bowel sounds are normal. There is no distension.     Palpations: Abdomen is soft. There is no mass.     Tenderness: There is no abdominal tenderness. There is no guarding or rebound.  Musculoskeletal:        General: No swelling or tenderness.      Right lower leg: No edema.     Left lower leg: No edema.     Comments: Unsteady gait walks with a cane   Skin:    General: Skin is warm and dry.     Coloration: Skin is not pale.     Findings: No bruising, erythema, lesion or rash.     Comments: Right inner thigh without any redness,swelling or tenderness to palpation.No nodes noted.   Neurological:     Mental Status: She is alert and oriented to person, place, and time.     Cranial Nerves: No cranial nerve deficit.     Sensory: No sensory deficit.     Motor: No weakness.     Coordination: Coordination normal.     Gait: Gait abnormal.  Psychiatric:        Mood and Affect: Mood normal.        Behavior: Behavior normal.        Thought Content: Thought content normal.        Judgment: Judgment normal.    Labs reviewed: Recent Labs    09/13/19 1430 09/14/19 0459 12/12/19 1009  NA 139 141 139  K 4.2 3.5 4.9  CL 101 106 101  CO2 27 25 29   GLUCOSE 306* 196* 162*  BUN 9 8 17   CREATININE 0.64 0.65 0.83  CALCIUM 9.2 8.3* 10.0  MG  --  1.8  --    Recent Labs    09/14/19 0459  AST 14*  ALT 16  ALKPHOS 31*  BILITOT 0.6  PROT 5.4*  ALBUMIN 2.7*   Recent Labs    09/13/19 1430 09/14/19 0459  WBC 8.9 10.5  NEUTROABS  --  6.0  HGB 15.3* 13.6  HCT 47.6* 41.5  MCV 94.6 93.3  PLT 372 334   No results found for: TSH Lab Results  Component Value Date   HGBA1C 8.4 (H) 12/12/2019   Lab Results  Component Value Date   CHOL 154 12/12/2019   HDL 47 (L) 12/12/2019   LDLCALC 82 12/12/2019   TRIG 150 (H) 12/12/2019   CHOLHDL 3.3 12/12/2019    Significant Diagnostic Results in last 30 days:  No results found.  Assessment/Plan   Peripheral polyneuropathy Reports burning sensation on both legs worst at bedtime. Advised to take Gabapentin   Side effects discussed including drowsiness advised not to drive.  - gabapentin (NEURONTIN) 100 MG capsule; Take 1 capsule (100 mg total) by mouth daily at bedtime.  Dispense: 30  capsule; Refill: 0  Family/ staff Communication: Reviewed plan of care with patient  Labs/tests ordered: scheduled for fasting labs today.   Next Appointment: Has upcoming appointment with Dr.Reed on 04/16/2020   Sandrea Hughs, NP

## 2020-04-11 LAB — BASIC METABOLIC PANEL
BUN: 17 mg/dL (ref 7–25)
CO2: 29 mmol/L (ref 20–32)
Calcium: 9.7 mg/dL (ref 8.6–10.4)
Chloride: 101 mmol/L (ref 98–110)
Creat: 0.74 mg/dL (ref 0.60–0.88)
Glucose, Bld: 194 mg/dL — ABNORMAL HIGH (ref 65–99)
Potassium: 4.6 mmol/L (ref 3.5–5.3)
Sodium: 140 mmol/L (ref 135–146)

## 2020-04-11 LAB — HEMOGLOBIN A1C
Hgb A1c MFr Bld: 7.6 % of total Hgb — ABNORMAL HIGH (ref <5.7)
Mean Plasma Glucose: 171 (calc)
eAG (mmol/L): 9.5 (calc)

## 2020-04-12 ENCOUNTER — Ambulatory Visit
Admission: EM | Admit: 2020-04-12 | Discharge: 2020-04-12 | Disposition: A | Discharge status: Home / Self Care | Payer: Medicare HMO | Attending: Emergency Medicine | Admitting: Emergency Medicine

## 2020-04-12 ENCOUNTER — Other Ambulatory Visit: Payer: Self-pay

## 2020-04-12 ENCOUNTER — Other Ambulatory Visit: Payer: Medicare HMO

## 2020-04-12 DIAGNOSIS — I83811 Varicose veins of right lower extremities with pain: Secondary | ICD-10-CM | POA: Diagnosis not present

## 2020-04-12 MED ORDER — MEDICAL COMPRESSION THIGH HIGH MISC: 1.0000 | Freq: Every day | 0 refills | Status: DC

## 2020-04-12 MED ORDER — NAPROXEN 375 MG PO TABS: 375.0000 mg | ORAL_TABLET | Freq: Two times a day (BID) | ORAL | 0 refills | Status: DC

## 2020-04-12 NOTE — Progress Notes (Signed)
Sugar average has improved considerably from previous with her diabetes treatment.  Continue the same and we can discuss at her appt.  I had ordered microalbumin and have been waiting for 2 days for the result but it has not come through.  Was she unable to do the sample?

## 2020-04-12 NOTE — ED Triage Notes (Signed)
Pt states she has experienced right leg pain since Monday with the formation of nodules or bumps after walking. Pt has not had an injury or trauma to the limb. Pt is aox4 and ambulates with assistive devices.

## 2020-04-12 NOTE — Discharge Instructions (Signed)
Naprosyn as needed for pain Hot compresses/heating pad as needed for swelling/discomfort Compression stockings prescription to medical equipment store  Follow up with vascular if able

## 2020-04-13 NOTE — ED Provider Notes (Signed)
[facility]    CSN: [id number] Arrival date & time: 04/12/20  1808      History   Chief Complaint Chief Complaint  Patient presents with  . Leg Pain    Lumps post walking    HPI Barbara Golden is a 84 y.o. female presenting today for concern of vein swelling and pain with walking.  Patient reports that since Monday she has noticed a prominent vein in her right inner thigh.  Mom says this more prominently with ambulating, does have some associated pain with prolonged standing/ambulation.  Denies pain at rest or to touch.  She denies any associated redness or change in skin.  HPI  Past Medical History:  Diagnosis Date  . Allergy     Patient Active Problem List   Diagnosis Date Noted  . Overgrown toenails 12/15/2019  . Lumbar paraspinal muscle spasm 10/06/2019  . Stress incontinence 10/06/2019  . Macular degeneration of both eyes 10/06/2019  . Uncontrolled type 2 diabetes mellitus with hyperglycemia ([place]) 10/06/2019  . Diabetes mellitus type 2 with retinopathy ([place]) 09/21/2019  . Essential hypertension 09/21/2019  . Asymptomatic bacteriuria 09/21/2019  . Dehydration 09/13/2019    Past Surgical History:  Procedure Laterality Date  . TUBAL LIGATION Bilateral     OB History   No obstetric history on file.      Home Medications    Prior to Admission medications   Medication Sig Start Date End Date Taking? Authorizing Provider  Continuous Blood Gluc Receiver (FREESTYLE LIBRE 14 DAY READER) DEVI 1 Units by Does not apply route 2 (two) times daily. H35.30, E11.65 10/07/19  Yes Reed, Tiffany L, DO  Continuous Blood Gluc Sensor (FREESTYLE LIBRE 14 DAY SENSOR) MISC Apply 1 sensor every 14 days E11.319 04/10/20  Yes Reed, Tiffany L, DO  famotidine (PEPCID) 10 MG tablet Take 10 mg by mouth 2 (two) times daily as needed for heartburn or indigestion.   Yes [provider]  fexofenadine (ALLEGRA) 60 MG tablet Take 60 mg by mouth daily.   Yes [provider]  gabapentin (NEURONTIN) 100 MG capsule Take 1 capsule (100 mg total) by mouth at bedtime. 04/10/20  Yes Ngetich, Dinah C, NP  metFORMIN (GLUCOPHAGE XR) 500 MG 24 hr tablet Take 1 tablet (500 mg total) by mouth 2 (two) times daily with a meal. 01/05/20 01/04/21 Yes Reed, Tiffany L, DO  TRULICITY 2.35 TD/3.2KG SOPN INJECT 0.75 MG INTO THE SKIN ONCE A WEEK. 02/14/20  Yes Reed, Tiffany L, DO  Elastic Bandages & Supports (MEDICAL COMPRESSION THIGH HIGH) MISC 1 each by Does not apply route daily. 04/12/20   Demerius Podolak C, PA-C  naproxen (NAPROSYN) 375 MG tablet Take 1 tablet (375 mg total) by mouth 2 (two) times daily. 04/12/20   Tamanna Whitson C, PA-C  cetirizine (ZYRTEC) 10 MG tablet Take 10 mg by mouth daily as needed for allergies.  09/13/19  [provider]  ranitidine (ZANTAC) 150 MG tablet Take 150 mg by mouth daily as needed for heartburn.  09/13/19  [provider]    Family History Family History  Problem Relation Age of Onset  . Cancer Mother   . Heart disease Father     Social History Social History   Tobacco Use  . Smoking status: Former Smoker    Packs/day: 0.50    Types: Cigarettes    Quit date: 03/26/2009    Years since quitting: 11.0  . Smokeless tobacco: Never Used  Vaping Use  . Vaping  Use: Never used  Substance Use Topics  . Alcohol use: No  . Drug use: No     Allergies   Codeine   Review of Systems Review of Systems  Constitutional: Negative for activity change, appetite change, chills, fatigue and fever.  HENT: Negative for congestion, ear pain, rhinorrhea, sinus pressure, sore throat and trouble swallowing.   Eyes: Negative for discharge and redness.  Respiratory: Negative for cough, chest tightness and shortness of breath.   Cardiovascular: Negative for chest pain.  Gastrointestinal: Negative for abdominal pain, diarrhea, nausea and vomiting.  Musculoskeletal: Negative for myalgias.  Skin: Negative for rash.  Neurological:  Negative for dizziness, light-headedness and headaches.     Physical Exam Triage Vital Signs ED Triage Vitals  Enc Vitals Group     BP 04/12/20 1824 (!) 147/83     Pulse Rate 04/12/20 1824 84     Resp 04/12/20 1824 18     Temp 04/12/20 1824 98.1 F (36.7 C)     Temp Source 04/12/20 1824 Oral     SpO2 04/12/20 1824 98 %     Weight --      Height --      Head Circumference --      Peak Flow --      Pain Score 04/12/20 1827 0     Pain Loc --      Pain Edu? --      Excl. in [place]? --    No data found.  Updated Vital Signs BP (!) 147/83 (BP Location: Right Arm)   Pulse 84   Temp 98.1 F (36.7 C) (Oral)   Resp 18   SpO2 98%   Visual Acuity Right Eye Distance:   Left Eye Distance:   Bilateral Distance:    Right Eye Near:   Left Eye Near:    Bilateral Near:     Physical Exam Vitals and nursing note reviewed.  Constitutional:      Appearance: She is well-developed.     Comments: No acute distress  HENT:     Head: Normocephalic and atraumatic.     Nose: Nose normal.  Eyes:     Conjunctiva/sclera: Conjunctivae normal.  Cardiovascular:     Rate and Rhythm: Normal rate.  Pulmonary:     Effort: Pulmonary effort is normal. No respiratory distress.  Abdominal:     General: There is no distension.  Musculoskeletal:        General: Normal range of motion.     Cervical back: Neck supple.     Comments: Right leg: No calf tenderness swelling or erythema, negative [place]  Skin:    General: Skin is warm and dry.     Comments: Right medial thigh with notable palpable vein extending distally, no overlying erythema or swelling noted, does have some mild areas of swelling suggestive of varicosities to bilateral lower legs    Neurological:     Mental Status: She is alert and oriented to person, place, and time.      UC Treatments / Results  Labs (all labs ordered are listed, but only abnormal results are displayed) Labs Reviewed - No data to  display  EKG   Radiology No results found.  Procedures Procedures (including critical care time)  Medications Ordered in UC Medications - No data to display  Initial Impression / Assessment and Plan / UC Course  I have reviewed the triage vital signs and the nursing notes.  Pertinent labs & imaging results that were available during  my care of the patient were reviewed by me and considered in my medical decision making (see chart for details).     Suspect likely may be underlying varicose vein that may become inflamed/painful when engorged from dependent positioning.  Recommending compression stockings, elevation and anti-inflammatories as needed, warm compresses.  May follow-up with vascular if symptoms continuing or worsening.  Low suspicion of DVT at this time, if developing increased swelling redness or calf pain to return to set up ultrasound.  Discussed strict return precautions. Patient verbalized understanding and is agreeable with plan.  Final Clinical Impressions(s) / UC Diagnoses   Final diagnoses:  Varicose veins of right lower extremity with pain     Discharge Instructions     Naprosyn as needed for pain Hot compresses/heating pad as needed for swelling/discomfort Compression stockings prescription to medical equipment store  Follow up with vascular if able   ED Prescriptions    Medication Sig Dispense Auth. Provider   naproxen (NAPROSYN) 375 MG tablet Take 1 tablet (375 mg total) by mouth 2 (two) times daily. 20 tablet Emilynn Srinivasan C, PA-C   Elastic Bandages & Supports (MEDICAL COMPRESSION THIGH HIGH) MISC 1 each by Does not apply route daily. 2 each Janith Lima, PA-C     PDMP not reviewed this encounter.   Janith Lima, [place] 04/13/20 1745

## 2020-04-16 ENCOUNTER — Encounter: Payer: Self-pay | Admitting: Internal Medicine

## 2020-04-16 ENCOUNTER — Ambulatory Visit (INDEPENDENT_AMBULATORY_CARE_PROVIDER_SITE_OTHER): Payer: Medicare HMO | Admitting: Internal Medicine

## 2020-04-16 ENCOUNTER — Other Ambulatory Visit: Payer: Self-pay

## 2020-04-16 VITALS — BP 122/82 | HR 88 | Temp 97.8°F | Ht 60.0 in | Wt 146.6 lb

## 2020-04-16 DIAGNOSIS — I83811 Varicose veins of right lower extremities with pain: Secondary | ICD-10-CM | POA: Diagnosis not present

## 2020-04-16 DIAGNOSIS — E1169 Type 2 diabetes mellitus with other specified complication: Secondary | ICD-10-CM

## 2020-04-16 DIAGNOSIS — E785 Hyperlipidemia, unspecified: Secondary | ICD-10-CM | POA: Diagnosis not present

## 2020-04-16 DIAGNOSIS — I10 Essential (primary) hypertension: Secondary | ICD-10-CM | POA: Diagnosis not present

## 2020-04-16 DIAGNOSIS — Z23 Encounter for immunization: Secondary | ICD-10-CM | POA: Diagnosis not present

## 2020-04-16 DIAGNOSIS — G629 Polyneuropathy, unspecified: Secondary | ICD-10-CM | POA: Diagnosis not present

## 2020-04-16 NOTE — Patient Instructions (Addendum)
I recommend you go to [facility] to get measured for thigh high compression hose.    Please call us back with your moderna covid vaccine dates.

## 2020-04-16 NOTE — Progress Notes (Signed)
Location:  [facility] clinic Provider:  Ridgely Anastacio L. Mariea Clonts, D.O., C.M.D.   Goals of Care:  Advanced Directives 04/10/2020  Does Patient Have a Medical Advance Directive? Yes  Type of Advance Directive Living will;Healthcare Power of Attorney  Does patient want to make changes to medical advance directive? No - Patient declined  Copy of [place] in Chart? No - copy requested  Would patient like information on creating a medical advance directive? No - Patient declined     Chief Complaint  Patient presents with  . Medical Management of Chronic Issues    51month follow up   . Acute Visit    swollen thigh on right leg she would like a referral Cardio Vascular   . Health Maintenance    Influenza, Covid 19 she will call us with the dates  and Urine microalbumin     HPI: Patient is a 84 y.o. female seen today for medical management of chronic diseases.    DMII:  Sugars are under better control.  She's improved her diet.  Avoids potatoes and rice together which her son-in-law eats together--he's from [place].    Right thigh has been swollen.  Says all of a sudden the varicosities became really prominent.  It appeared a week before she was seen in the ED 9/23.  She will sometimes have pain that will go to the right or to the left on her leg.  She did try gabapentin.  It did work for that.  She has not noticed any problem since taking it.    Thigh high compression hose were recommended.  She does have a single knee high compression sock for the right.  Had after surgery.  The pair she tried wearing was too long.    Past Medical History:  Diagnosis Date  . Allergy     Past Surgical History:  Procedure Laterality Date  . TUBAL LIGATION Bilateral     Allergies  Allergen Reactions  . Codeine Other (See Comments)    Causes lip burning and hyperactivity    Outpatient Encounter Medications as of 04/16/2020  Medication Sig  . Continuous Blood Gluc Receiver (FREESTYLE  LIBRE 14 DAY READER) DEVI 1 Units by Does not apply route 2 (two) times daily. H35.30, E11.65  . Continuous Blood Gluc Sensor (FREESTYLE LIBRE 14 DAY SENSOR) MISC Apply 1 sensor every 14 days E11.319  . Elastic Bandages & Supports (MEDICAL COMPRESSION THIGH HIGH) MISC 1 each by Does not apply route daily.  . famotidine (PEPCID) 10 MG tablet Take 10 mg by mouth 2 (two) times daily as needed for heartburn or indigestion.  . fexofenadine (ALLEGRA) 60 MG tablet Take 60 mg by mouth daily.  Marland Kitchen gabapentin (NEURONTIN) 100 MG capsule Take 1 capsule (100 mg total) by mouth at bedtime.  . metFORMIN (GLUCOPHAGE XR) 500 MG 24 hr tablet Take 1 tablet (500 mg total) by mouth 2 (two) times daily with a meal.  . naproxen (NAPROSYN) 375 MG tablet Take 1 tablet (375 mg total) by mouth 2 (two) times daily.  . TRULICITY 3.53 GD/9.2EQ SOPN INJECT 0.75 MG INTO THE SKIN ONCE A WEEK.  . [DISCONTINUED] cetirizine (ZYRTEC) 10 MG tablet Take 10 mg by mouth daily as needed for allergies.  . [DISCONTINUED] ranitidine (ZANTAC) 150 MG tablet Take 150 mg by mouth daily as needed for heartburn.   No facility-administered encounter medications on file as of 04/16/2020.    Review of Systems:  Review of Systems  Constitutional: Negative  for chills and fever.  HENT: Negative for congestion.   Eyes: Negative for blurred vision.  Respiratory: Negative for cough and shortness of breath.   Cardiovascular: Positive for leg swelling. Negative for chest pain and palpitations.       Varicose veins  Gastrointestinal: Negative for abdominal pain.  Genitourinary: Negative for dysuria.  Musculoskeletal: Negative for falls.  Neurological: Positive for tingling and sensory change. Negative for dizziness and loss of consciousness.  Psychiatric/Behavioral: Negative for depression and memory loss. The patient is not nervous/anxious and does not have insomnia.     Health Maintenance  Topic Date Due  . URINE MICROALBUMIN  Never done  .  COVID-19 Vaccine (1) Never done  . INFLUENZA VACCINE  02/19/2020  . DEXA SCAN  10/05/2020 (Originally 03/22/2003)  . PNA vac Low Risk Adult (1 of 2 - PCV13) 10/05/2020 (Originally 03/22/2003)  . FOOT EXAM  09/12/2020  . HEMOGLOBIN A1C  10/08/2020  . OPHTHALMOLOGY EXAM  02/13/2021  . TETANUS/TDAP  10/04/2028    Physical Exam: Vitals:   04/16/20 1458  Weight: 146 lb 9.6 oz (66.5 kg)  Height: 5' (1.524 m)   Body mass index is 28.63 kg/m. Physical Exam Vitals reviewed.  Constitutional:      Appearance: Normal appearance.  HENT:     Head: Normocephalic and atraumatic.  Eyes:     Extraocular Movements: Extraocular movements intact.     Pupils: Pupils are equal, round, and reactive to light.  Cardiovascular:     Rate and Rhythm: Normal rate and regular rhythm.     Pulses: Normal pulses.     Heart sounds: Normal heart sounds.     Comments: Right leg has increased prominence of varicose veins with some tortuosity in areas Pulmonary:     Effort: Pulmonary effort is normal.     Breath sounds: Normal breath sounds. No wheezing, rhonchi or rales.  Abdominal:     General: Bowel sounds are normal.  Musculoskeletal:     Right lower leg: Edema present.     Comments: Walks with walking stick  Skin:    General: Skin is warm and dry.  Neurological:     General: No focal deficit present.     Mental Status: She is alert and oriented to person, place, and time. Mental status is at baseline.  Psychiatric:        Mood and Affect: Mood normal.        Behavior: Behavior normal.     Labs reviewed: Basic Metabolic Panel: Recent Labs    09/14/19 0459 12/12/19 1009 04/10/20 0947  NA 141 139 140  K 3.5 4.9 4.6  CL 106 101 101  CO2 25 29 29   GLUCOSE 196* 162* 194*  BUN 8 17 17   CREATININE 0.65 0.83 0.74  CALCIUM 8.3* 10.0 9.7  MG 1.8  --   --    Liver Function Tests: Recent Labs    09/14/19 0459  AST 14*  ALT 16  ALKPHOS 31*  BILITOT 0.6  PROT 5.4*  ALBUMIN 2.7*   No results  for input(s): LIPASE, AMYLASE in the last 8760 hours. No results for input(s): AMMONIA in the last 8760 hours. CBC: Recent Labs    09/13/19 1430 09/14/19 0459  WBC 8.9 10.5  NEUTROABS  --  6.0  HGB 15.3* 13.6  HCT 47.6* 41.5  MCV 94.6 93.3  PLT 372 334   Lipid Panel: Recent Labs    12/12/19 1009  CHOL 154  HDL 47*  LDLCALC 82  TRIG 150*  CHOLHDL 3.3   Lab Results  Component Value Date   HGBA1C 7.6 (H) 04/10/2020     Assessment/Plan 1. Varicose veins of right lower extremity with pain -given Rx for thigh high compression stockings 20-25mmHg to get at [facility]   2. Peripheral polyneuropathy -has had this long before her diabetes it sounds like, cont gabapentin which has helped her - CBC with Differential/Platelet; Future  3. Controlled type 2 diabetes mellitus with other specified complication, without long-term current use of insulin (HCC) -now controlled, cont trulicity and metformin plus her improved diet - CBC with Differential/Platelet; Future - BASIC METABOLIC PANEL WITH GFR; Future - Hemoglobin A1c; Future  4. Hyperlipidemia associated with type 2 diabetes mellitus ([place]) -last was just above goal in may--has avoided statins Lab Results  Component Value Date   LDLCALC 82 12/12/2019   - CBC with Differential/Platelet; Future - BASIC METABOLIC PANEL WITH GFR; Future - Lipid panel; Future  5. Essential hypertension -bp at goal, cont same regimen, monitor - BASIC METABOLIC PANEL WITH GFR; Future  6. Need for influenza vaccination - Flu Vaccine QUAD High Dose(Fluad) given    Labs/tests ordered:   Lab Orders     CBC with Differential/Platelet     BASIC METABOLIC PANEL WITH GFR     Hemoglobin A1c     Lipid panel  Next appt:  08/16/2020  Was to call back with moderna covid vaccine dates  Rosamaria Donn L. Beatriz Settles, D.O. [facility] [address], [place] [address] Cell Phone (Mon-Fri  8am-5pm):  [phone] On Call:  [phone] & follow prompts after 5pm & weekends Office Phone:  [phone] Office Fax:  [phone]

## 2020-04-19 ENCOUNTER — Telehealth: Payer: Self-pay | Admitting: Internal Medicine

## 2020-04-19 NOTE — Telephone Encounter (Signed)
Patient called back with dates for [employer]. They have been updated in the system

## 2020-04-19 NOTE — Telephone Encounter (Signed)
Called patient e-mail has not been set up yet, I will try again

## 2020-04-19 NOTE — Telephone Encounter (Signed)
Please call pt for moderna covid vaccine dates.

## 2020-05-02 ENCOUNTER — Telehealth: Payer: Self-pay | Admitting: *Deleted

## 2020-05-02 NOTE — Telephone Encounter (Signed)
Patient daughter, Reina Fuse dropped off Handicap Placard form to be filled out due to unable to walk without cane or unable to walk any distance without stopping.   Filled out and placed form in Dr. Cyndi Lennert folder to review and sign.   To call daughter, Jeannene Patella, once ready for pick up [phone]

## 2020-05-03 NOTE — Telephone Encounter (Signed)
Form Completed and daughter notified for pick up. Left up front.  Copy sent to scanning.

## 2020-05-04 DIAGNOSIS — E1165 Type 2 diabetes mellitus with hyperglycemia: Secondary | ICD-10-CM | POA: Diagnosis not present

## 2020-05-04 DIAGNOSIS — I10 Essential (primary) hypertension: Secondary | ICD-10-CM | POA: Diagnosis not present

## 2020-05-05 LAB — MICROALBUMIN / CREATININE URINE RATIO
Creatinine, Urine: 89 mg/dL (ref 20–275)
Microalb Creat Ratio: 7 mcg/mg creat (ref <30)
Microalb, Ur: 0.6 mg/dL

## 2020-05-07 ENCOUNTER — Other Ambulatory Visit: Payer: Self-pay | Admitting: *Deleted

## 2020-05-07 DIAGNOSIS — M79606 Pain in leg, unspecified: Secondary | ICD-10-CM

## 2020-05-07 NOTE — Progress Notes (Signed)
No significant protein in her urine :)

## 2020-05-17 ENCOUNTER — Other Ambulatory Visit: Payer: Self-pay

## 2020-05-17 ENCOUNTER — Ambulatory Visit: Payer: Medicare HMO | Admitting: Physician Assistant

## 2020-05-17 ENCOUNTER — Ambulatory Visit (HOSPITAL_COMMUNITY)
Admission: RE | Admit: 2020-05-17 | Discharge: 2020-05-17 | Disposition: A | Discharge status: Home / Self Care | Payer: Medicare HMO | Source: Ambulatory Visit | Attending: Vascular Surgery | Admitting: Vascular Surgery

## 2020-05-17 VITALS — BP 132/62 | HR 64 | Temp 98.1°F | Resp 20 | Ht 60.0 in | Wt 145.2 lb

## 2020-05-17 DIAGNOSIS — M79606 Pain in leg, unspecified: Secondary | ICD-10-CM | POA: Insufficient documentation

## 2020-05-17 DIAGNOSIS — I8393 Asymptomatic varicose veins of bilateral lower extremities: Secondary | ICD-10-CM

## 2020-05-17 DIAGNOSIS — I872 Venous insufficiency (chronic) (peripheral): Secondary | ICD-10-CM

## 2020-05-17 DIAGNOSIS — I83811 Varicose veins of right lower extremities with pain: Secondary | ICD-10-CM | POA: Diagnosis not present

## 2020-05-17 NOTE — Progress Notes (Signed)
Requested by:  Gayland Curry, DO [place],  [place] [address]  Reason for consultation: RLE pain    History of Present Illness   Barbara Golden is a 84 y.o. (07-May-1936) female who presents for evaluation of right lower extremity pain. She complains of right thigh pain worse when walking. She also complains of hypesthesia of the inner thigh. She denies calf pain when walking or pain at night. She was advised to use a cain due to back trouble in the past.  She also bought [employer]st 15-71mm Hg thigh stockings one month ago.  Venous symptoms include: positive if (X) [ x ] aching [  ] heavy [ x ] tired  [  ] throbbing [  ] burning  [  ] itching [  ]swelling [  ] bleeding [  ] ulcer Onset/duration:  One month Occupation:  Retired, keeps house, does her own shopping Aggravating factors: ambulation, standing Alleviating factors: rest, elevation Compression:  Yes (thigh high 15-20 mm Hg) Helps:  yes Pain medications:  Neurontin Previous vein procedures:  none History of DVT:  no  No blood thinners. + diabetes   Past Medical History:  Diagnosis Date  . Allergy     Past Surgical History:  Procedure Laterality Date  . TUBAL LIGATION Bilateral     Social History   Socioeconomic History  . Marital status: Married    Spouse name: Not on file  . Number of children: 1  . Years of education: Not on file  . Highest education level: Not on file  Occupational History  . Not on file  Tobacco Use  . Smoking status: Former Smoker    Packs/day: 0.50    Types: Cigarettes    Quit date: 03/26/2009    Years since quitting: 11.1  . Smokeless tobacco: Never Used  Vaping Use  . Vaping Use: Never used  Substance and Sexual Activity  . Alcohol use: No  . Drug use: No  . Sexual activity: Not Currently  Other Topics Concern  . Not on file  Social History Narrative   3 Step Daughters   Social Determinants of Health   Financial Resource Strain:   . Difficulty of Paying  Living Expenses: Not on file  Food Insecurity:   . Worried About Charity fundraiser in the Last Year: Not on file  . Ran Out of Food in the Last Year: Not on file  Transportation Needs:   . Lack of Transportation (Medical): Not on file  . Lack of Transportation (Non-Medical): Not on file  Physical Activity:   . Days of Exercise per Week: Not on file  . Minutes of Exercise per Session: Not on file  Stress:   . Feeling of Stress : Not on file  Social Connections:   . Frequency of Communication with Friends and Family: Not on file  . Frequency of Social Gatherings with Friends and Family: Not on file  . Attends Religious Services: Not on file  . Active Member of Clubs or Organizations: Not on file  . Attends Archivist Meetings: Not on file  . Marital Status: Not on file  Intimate Partner Violence:   . Fear of Current or Ex-Partner: Not on file  . Emotionally Abused: Not on file  . Physically Abused: Not on file  . Sexually Abused: Not on file    Family History  Problem Relation Age of Onset  . Cancer Mother   . Heart disease  Father     Current Outpatient Medications  Medication Sig Dispense Refill  . Continuous Blood Gluc Receiver (FREESTYLE LIBRE 14 DAY READER) DEVI 1 Units by Does not apply route 2 (two) times daily. H35.30, E11.65 1 each 2  . Continuous Blood Gluc Sensor (FREESTYLE LIBRE 14 DAY SENSOR) MISC Apply 1 sensor every 14 days E11.319 2 each 11  . Elastic Bandages & Supports (MEDICAL COMPRESSION THIGH HIGH) MISC 1 each by Does not apply route daily. 2 each 0  . famotidine (PEPCID) 10 MG tablet Take 10 mg by mouth 2 (two) times daily as needed for heartburn or indigestion.    . fexofenadine (ALLEGRA) 60 MG tablet Take 60 mg by mouth daily.    Marland Kitchen gabapentin (NEURONTIN) 100 MG capsule Take 1 capsule (100 mg total) by mouth at bedtime. 30 capsule 0  . metFORMIN (GLUCOPHAGE XR) 500 MG 24 hr tablet Take 1 tablet (500 mg total) by mouth 2 (two) times daily with a  meal. 180 tablet 1  . Multiple Vitamins-Minerals (ICAPS AREDS 2 PO) Take by mouth in the morning and at bedtime.    . naproxen (NAPROSYN) 375 MG tablet Take 1 tablet (375 mg total) by mouth 2 (two) times daily. 20 tablet 0  . TRULICITY 1.47 WG/9.5AO SOPN INJECT 0.75 MG INTO THE SKIN ONCE A WEEK. 2 mL 2   No current facility-administered medications for this visit.    Allergies  Allergen Reactions  . Codeine Other (See Comments)    Causes lip burning and hyperactivity    REVIEW OF SYSTEMS (negative unless checked):   Cardiac:  []  Chest pain or chest pressure? []  Shortness of breath upon activity? []  Shortness of breath when lying flat? []  Irregular heart rhythm?  Vascular:  [x]  Pain in calf, thigh, or hip brought on by walking? []  Pain in feet at night that wakes you up from your sleep? []  Blood clot in your veins? []  Leg swelling?  Pulmonary:  []  Oxygen at home? []  Productive cough? []  Wheezing?  Neurologic:  []  Sudden weakness in arms or legs? []  Sudden numbness in arms or legs? []  Sudden onset of difficult speaking or slurred speech? []  Temporary loss of vision in one eye? []  Problems with dizziness?  Gastrointestinal:  []  Blood in stool? []  Vomited blood?  Genitourinary:  []  Burning when urinating? []  Blood in urine?  Psychiatric:  []  Major depression  Hematologic:  []  Bleeding problems? []  Problems with blood clotting?  Dermatologic:  []  Rashes or ulcers?  Constitutional:  []  Fever or chills?  Ear/Nose/Throat:  []  Change in hearing? []  Nose bleeds? []  Sore throat?  Musculoskeletal:  []  Back pain? []  Joint pain? []  Muscle pain?   Physical Examination     Vitals:   05/17/20 1348  BP: 132/62  Pulse: 64  Resp: 20  Temp: 98.1 F (36.7 C)  SpO2: 97%   General:  WDWN in NAD; vital signs documented above Gait: unaided, no ataxia HENT: WNL, normocephalic Pulmonary: normal non-labored breathing , without Rales, rhonchi,   wheezing Cardiac: irregular HR, without  Murmurs without carotid bruits Abdomen: soft, NT, no masses Skin: without rashes Vascular Exam/Pulses:  Right Left  Radial 2+ (normal) 2+ (normal)  Ulnar Not eval Not eval  Femoral 2+ (normal) 2+ (normal)  Popliteal trace 1+ (weak)  DP Not palp 2+  PT 2+ Not palp   Extremities: with varicose veins, without reticular veins, without edema, without stasis pigmentation, without lipodermatosclerosis, without ulcers Musculoskeletal: no muscle wasting or atrophy  Neurologic: A&O X 3;  No focal weakness or paresthesias are detected Psychiatric:  The pt has Normal affect.  Non-invasive Vascular Imaging   RLE Venous Insufficiency Duplex (05/17/2020):   RLE:   negative DVT and SVT,   positive GSV reflux form proximal calf to mid thigh,  GSV diameter 0.447-0.561 cm  negative SSV reflux ,  positive deep venous reflux  Right:  - No evidence of deep vein thrombosis seen in the right lower extremity,  from the common femoral through the popliteal veins.  - No evidence of superficial venous thrombosis in the right lower  extremity.  - Deep vein reflux in the CFV.  - Superficial vein reflux in the SFJ and GSV.   Medical Decision Making   Barbara Golden is a 84 y.o. female who presents with: RLE chronic venous insufficiency. Duplex exam significant for reflux of the right GSV with dilatation of the vein from proximal calf to the mid thigh and at the [facility]. Bilateral varicose veins with complications including pain and sensitivity of the right medial thigh.  No evidence of DVT or arterial insufficiency.   Based on the patient's history and examination, I recommend: continued elevation and exercise.  I discussed with the patient the use of her 20-30 mm thigh high compression stockings and need for 3 month trial of such. She purchased these today.  The patient will follow up in 3 months with Dr. Scot Dock and Dr. Oneida Alar.  Thank you for allowing  [place] to participate in this patient's care.   Barbie Banner, PA-C [facility] Office: [phone]  05/17/2020, 1:56 PM  Clinic MD: Oneida Alar

## 2020-06-26 ENCOUNTER — Encounter: Payer: Self-pay | Admitting: Internal Medicine

## 2020-06-26 DIAGNOSIS — H3530 Unspecified macular degeneration: Secondary | ICD-10-CM

## 2020-06-26 DIAGNOSIS — E1169 Type 2 diabetes mellitus with other specified complication: Secondary | ICD-10-CM

## 2020-07-02 ENCOUNTER — Other Ambulatory Visit: Payer: Self-pay | Admitting: Internal Medicine

## 2020-07-02 DIAGNOSIS — E11319 Type 2 diabetes mellitus with unspecified diabetic retinopathy without macular edema: Secondary | ICD-10-CM

## 2020-07-26 ENCOUNTER — Encounter: Payer: Self-pay | Admitting: Internal Medicine

## 2020-07-27 ENCOUNTER — Ambulatory Visit (INDEPENDENT_AMBULATORY_CARE_PROVIDER_SITE_OTHER): Payer: Medicare HMO | Admitting: Family

## 2020-07-27 ENCOUNTER — Encounter: Payer: Self-pay | Admitting: Family

## 2020-07-27 ENCOUNTER — Other Ambulatory Visit: Payer: Self-pay

## 2020-07-27 VITALS — BP 138/70 | HR 87 | Temp 97.7°F | Resp 16 | Ht 60.0 in | Wt 150.0 lb

## 2020-07-27 DIAGNOSIS — N939 Abnormal uterine and vaginal bleeding, unspecified: Secondary | ICD-10-CM

## 2020-07-27 DIAGNOSIS — R399 Unspecified symptoms and signs involving the genitourinary system: Secondary | ICD-10-CM | POA: Diagnosis not present

## 2020-07-27 LAB — POCT URINALYSIS DIPSTICK
Bilirubin, UA: NEGATIVE
Glucose, UA: POSITIVE — AB
Ketones, UA: NEGATIVE
Nitrite, UA: NEGATIVE
Protein, UA: POSITIVE — AB
Spec Grav, UA: >=1.03 — AB (ref 1.010–1.025)
Urobilinogen, UA: NEGATIVE E.U./dL — AB
pH, UA: 5 (ref 5.0–8.0)

## 2020-07-27 LAB — CBC WITH DIFFERENTIAL/PLATELET
Absolute Monocytes: 902 cells/uL (ref 200–950)
Basophils Absolute: 68 cells/uL (ref 0–200)
Basophils Relative: 0.7 %
Eosinophils Absolute: 87 cells/uL (ref 15–500)
Eosinophils Relative: 0.9 %
HCT: 42.5 % (ref 35.0–45.0)
Hemoglobin: 14.4 g/dL (ref 11.7–15.5)
Lymphs Abs: 2968 cells/uL (ref 850–3900)
MCH: 30.8 pg (ref 27.0–33.0)
MCHC: 33.9 g/dL (ref 32.0–36.0)
MCV: 90.8 fL (ref 80.0–100.0)
MPV: 9.9 fL (ref 7.5–12.5)
Monocytes Relative: 9.3 %
Neutro Abs: 5675 cells/uL (ref 1500–7800)
Neutrophils Relative %: 58.5 %
Platelets: 385 10*3/uL (ref 140–400)
RBC: 4.68 10*6/uL (ref 3.80–5.10)
RDW: 12.5 % (ref 11.0–15.0)
Total Lymphocyte: 30.6 %
WBC: 9.7 10*3/uL (ref 3.8–10.8)

## 2020-07-27 NOTE — Progress Notes (Signed)
Provider: Kiley Torrence FNP-C  Gayland Curry, DO  Patient Care Team: Gayland Curry, DO as PCP - General (Geriatric Medicine)  Extended Emergency Contact Information Primary Emergency Contact: Theador Hawthorne [place] Phone: [phone] Mobile Phone: [phone] Relation: Daughter  Code Status:  DNR Goals of care: Advanced Directive information Advanced Directives 07/27/2020  Does Patient Have a Medical Advance Directive? Yes  Type of Advance Directive Living will  Does patient want to make changes to medical advance directive? -  Copy of [place] in Chart? -  Would patient like information on creating a medical advance directive? -     Chief Complaint  Patient presents with  . Acute Visit    Complains of bleeding.    HPI:  Pt is a 85 y.o. female seen today for an acute visit for evaluation of vaginal bleeding.she is here with her daughter.she states vaginal bleeding started one day ago with spotting then progressed to more bleeding.states feels like when she used to get her periods.Has bilateral lower abdomen pain which is described as cramping.she denies any nausea,vomiting or bloating sensation.also denies any back pain, urine frequency,urgency or dysuria.    Past Medical History:  Diagnosis Date  . Allergy    Past Surgical History:  Procedure Laterality Date  . TUBAL LIGATION Bilateral     Allergies  Allergen Reactions  . Codeine Other (See Comments)    Causes lip burning and hyperactivity    Outpatient Encounter Medications as of 07/27/2020  Medication Sig  . Continuous Blood Gluc Receiver (FREESTYLE LIBRE 14 DAY READER) DEVI 1 Units by Does not apply route 2 (two) times daily. H35.30, E11.65  . Continuous Blood Gluc Sensor (FREESTYLE LIBRE 14 DAY SENSOR) MISC Apply 1 sensor every 14 days E11.319  . Elastic Bandages & Supports (MEDICAL COMPRESSION THIGH HIGH) MISC 1 each by Does not apply route daily.  . famotidine  (PEPCID) 10 MG tablet Take 10 mg by mouth 2 (two) times daily as needed for heartburn or indigestion.  . fexofenadine (ALLEGRA) 60 MG tablet Take 60 mg by mouth daily.  Marland Kitchen gabapentin (NEURONTIN) 100 MG capsule Take 1 capsule (100 mg total) by mouth at bedtime.  . metFORMIN (GLUCOPHAGE-XR) 500 MG 24 hr tablet TAKE 1 TABLET (500 MG TOTAL) BY MOUTH 2 (TWO) TIMES DAILY WITH A MEAL.  . Multiple Vitamins-Minerals (ICAPS AREDS 2 PO) Take by mouth in the morning and at bedtime.  . naproxen (NAPROSYN) 375 MG tablet Take 1 tablet (375 mg total) by mouth 2 (two) times daily.  . TRULICITY 4.00 QQ/7.6PP SOPN INJECT 0.75 MG INTO THE SKIN ONCE A WEEK.  . [DISCONTINUED] cetirizine (ZYRTEC) 10 MG tablet Take 10 mg by mouth daily as needed for allergies.  . [DISCONTINUED] ranitidine (ZANTAC) 150 MG tablet Take 150 mg by mouth daily as needed for heartburn.   No facility-administered encounter medications on file as of 07/27/2020.    Review of Systems  Constitutional: Negative for chills, fatigue and fever.  Respiratory: Negative for cough, chest tightness, shortness of breath and wheezing.   Cardiovascular: Negative for chest pain, palpitations and leg swelling.  Gastrointestinal: Negative for abdominal distention, constipation, diarrhea, nausea and vomiting.       Lower abdominal pain   Genitourinary: Negative for difficulty urinating, dysuria, flank pain, frequency and urgency.  Musculoskeletal: Positive for gait problem. Negative for back pain.  Skin: Negative for color change, pallor and rash.  Neurological: Negative for dizziness, speech difficulty, light-headedness and headaches.  Hematological: Does not bruise/bleed easily.    Immunization History  Administered Date(s) Administered  . Fluad Quad(high Dose 65+) 04/16/2020  . Influenza, High Dose Seasonal PF 06/10/2019  . Moderna Sars-Covid-2 Vaccination 08/04/2019, 09/01/2019  . Tdap 10/05/2018   Pertinent  Health Maintenance Due  Topic Date Due   . OPHTHALMOLOGY EXAM  06/20/2020  . DEXA SCAN  10/05/2020 (Originally 03/22/2003)  . PNA vac Low Risk Adult (1 of 2 - PCV13) 10/05/2020 (Originally 03/22/2003)  . FOOT EXAM  09/12/2020  . HEMOGLOBIN A1C  10/08/2020  . URINE MICROALBUMIN  05/04/2021  . INFLUENZA VACCINE  Completed   Fall Risk  07/27/2020 04/16/2020 04/10/2020 12/15/2019 11/04/2019  Falls in the past year? 0 - 0 - 0  Number falls in past yr: 0 0 0 0 0  Injury with Fall? 0 0 0 0 0   Functional Status Survey:    Vitals:   07/27/20 1450  BP: 138/70  Pulse: 87  Resp: 16  Temp: 97.7 F (36.5 C)  SpO2: 97%  Weight: 150 lb (68 kg)  Height: 5' (1.524 m)   Body mass index is 29.29 kg/m. Physical Exam Vitals reviewed. Exam conducted with a chaperone present (charlene Parker,CMA).  Constitutional:      General: She is not in acute distress.    Appearance: She is overweight. She is not ill-appearing.  HENT:     Head: Normocephalic.  Eyes:     General: No scleral icterus.       Right eye: No discharge.        Left eye: No discharge.     Extraocular Movements: Extraocular movements intact.     Conjunctiva/sclera: Conjunctivae normal.     Pupils: Pupils are equal, round, and reactive to light.  Cardiovascular:     Rate and Rhythm: Normal rate and regular rhythm.     Pulses: Normal pulses.     Heart sounds: Normal heart sounds. No murmur heard. No friction rub. No gallop.   Pulmonary:     Effort: Pulmonary effort is normal. No respiratory distress.     Breath sounds: Normal breath sounds. No wheezing, rhonchi or rales.  Chest:     Chest wall: No tenderness.  Abdominal:     General: Bowel sounds are normal. There is no distension.     Palpations: Abdomen is soft. There is no mass.     Tenderness: There is no abdominal tenderness. There is no right CVA tenderness, left CVA tenderness, guarding or rebound.  Genitourinary:    General: Normal vulva.     Cervix: Cervical bleeding present. No friability, erythema or  eversion.     Adnexa: Right adnexa normal and left adnexa normal.     Rectum: Normal.  Musculoskeletal:        General: No swelling or tenderness.     Right lower leg: No edema.     Left lower leg: No edema.     Comments: Unsteady gait   Skin:    General: Skin is warm and dry.     Coloration: Skin is not pale.     Findings: No bruising, erythema or rash.  Neurological:     Mental Status: She is alert and oriented to person, place, and time.     Cranial Nerves: No cranial nerve deficit.     Sensory: No sensory deficit.     Motor: No weakness.     Coordination: Coordination normal.     Gait: Gait normal.  Psychiatric:  Mood and Affect: Mood normal.        Behavior: Behavior normal.        Thought Content: Thought content normal.        Judgment: Judgment normal.     Labs reviewed: Recent Labs    09/14/19 0459 12/12/19 1009 04/10/20 0947  NA 141 139 140  K 3.5 4.9 4.6  CL 106 101 101  CO2 25 29 29   GLUCOSE 196* 162* 194*  BUN 8 17 17   CREATININE 0.65 0.83 0.74  CALCIUM 8.3* 10.0 9.7  MG 1.8  --   --    Recent Labs    09/14/19 0459  AST 14*  ALT 16  ALKPHOS 31*  BILITOT 0.6  PROT 5.4*  ALBUMIN 2.7*   Recent Labs    09/13/19 1430 09/14/19 0459  WBC 8.9 10.5  NEUTROABS  --  6.0  HGB 15.3* 13.6  HCT 47.6* 41.5  MCV 94.6 93.3  PLT 372 334   No results found for: TSH Lab Results  Component Value Date   HGBA1C 7.6 (H) 04/10/2020   Lab Results  Component Value Date   CHOL 154 12/12/2019   HDL 47 (L) 12/12/2019   LDLCALC 82 12/12/2019   TRIG 150 (H) 12/12/2019   CHOLHDL 3.3 12/12/2019    Significant Diagnostic Results in last 30 days:  No results found.  Assessment/Plan 1. Symptoms of urinary tract infection Afebrile.reports bilateral lower abdomen pain/cramping.No urine frequency,urgency or dysuria. - POC Urinalysis Dipstick showed dark yellow,cloudy urine positive for large blood and large leukocytes 3+but negative for nitrites. Advised  to increase water intake.Will send specimen for urine culture aware results will return in three days. - Advised to notify provider if symptoms worsen.  - Urine Culture  2. Vaginal bleeding Moderate amounts of Dark red blood noted on pad and small amounts noted on cervix.Non-tender and no mass to palpation. Discussed referral to GYN.Advised to notify provider or go to ED if symptoms or worsen.Will obtain CBC to evaluate acute abnormalities.Notified specialist office will call for appointment. - CBC with Differential/Platelet - Ambulatory referral to Gynecology  Family/ staff Communication: Reviewed plan of care with patient and daughter   Labs/tests ordered: - CBC with Differential/Platelet  Next Appointment: As needed if symptoms worsen or fail to improve.  Sandrea Hughs, NP

## 2020-07-27 NOTE — Patient Instructions (Signed)
-   Urgent referral placed for gynecology for evaluation of vaginal bleeding specialist office will call you for appointment  - Notify provider or go to ED if symptoms worsen  Postmenopausal Bleeding  Postmenopausal bleeding is any bleeding that a woman has after she has entered into menopause. Menopause is the end of a woman's fertile years. After menopause, a woman no longer ovulates and does not have menstrual periods. Postmenopausal bleeding may have various causes, including:  Menopausal hormone therapy (MHT).  Endometrial atrophy. After menopause, low estrogen hormone levels cause the membrane that lines the uterus (endometrium) to become thinner. You may have bleeding as the endometrium thins.  Endometrial hyperplasia. This condition is caused by excess estrogen hormones and low levels of progesterone hormones. The excess estrogen causes the endometrium to thicken, which can lead to bleeding. In some cases, this can lead to cancer of the uterus.  Endometrial cancer.  Non-cancerous growths (polyps) on the endometrium, the lining of the uterus, or the cervix.  Uterine fibroids. These are non-cancerous growths in or around the uterus muscle tissue that can cause heavy bleeding. Any type of postmenopausal bleeding, even if it appears to be a typical menstrual period, should be evaluated by your health care provider. Treatment will depend on the cause of the bleeding. Follow these instructions at home:  Pay attention to any changes in your symptoms.  Avoid using tampons and douches as told by your health care provider.  Change your pads regularly.  Get regular pelvic exams and Pap tests.  Take iron supplements as told by your health care provider.  Take over-the-counter and prescription medicines only as told by your health care provider.  Keep all follow-up visits as told by your health care provider. This is important. Contact a health care provider if:  Your bleeding lasts  more than 1 week.  You have abdominal pain.  You have bleeding with or after sexual intercourse.  You have bleeding that happens more often than every 3 weeks. Get help right away if:  You have a fever, chills, headache, dizziness, muscle aches, and bleeding.  You have severe pain with bleeding.  You are passing blood clots.  You have heavy bleeding, need more than 1 pad an hour, and have never experienced this before.  You feel faint. Summary  Postmenopausal bleeding is any bleeding that a woman has after she has entered into menopause.  Postmenopausal bleeding may have various causes. Treatment will depend on the cause of the bleeding.  Any type of postmenopausal bleeding, even if it appears to be a typical menstrual period, should be evaluated by your health care provider.  Be sure to pay attention to any changes in your symptoms and keep all follow-up visits as told by your health care provider. This information is not intended to replace advice given to you by your health care provider. Make sure you discuss any questions you have with your health care provider. Document Revised: 10/14/2017 Document Reviewed: 09/30/2016 Elsevier Patient Education  [place].

## 2020-07-29 LAB — URINE CULTURE
MICRO NUMBER:: 11397352
Result:: NO GROWTH
SPECIMEN QUALITY:: ADEQUATE

## 2020-08-02 ENCOUNTER — Other Ambulatory Visit (HOSPITAL_COMMUNITY)
Admission: RE | Admit: 2020-08-02 | Discharge: 2020-08-02 | Disposition: A | Discharge status: Home / Self Care | Payer: Medicare HMO | Source: Ambulatory Visit | Attending: Obstetrics and Gynecology | Admitting: Obstetrics and Gynecology

## 2020-08-02 ENCOUNTER — Other Ambulatory Visit: Payer: Self-pay

## 2020-08-02 ENCOUNTER — Ambulatory Visit: Payer: Medicare HMO | Admitting: Obstetrics and Gynecology

## 2020-08-02 ENCOUNTER — Encounter: Payer: Self-pay | Admitting: Obstetrics and Gynecology

## 2020-08-02 VITALS — BP 142/76 | HR 97 | Ht 64.0 in | Wt 149.0 lb

## 2020-08-02 DIAGNOSIS — N950 Postmenopausal bleeding: Secondary | ICD-10-CM | POA: Insufficient documentation

## 2020-08-02 DIAGNOSIS — Z124 Encounter for screening for malignant neoplasm of cervix: Secondary | ICD-10-CM | POA: Diagnosis not present

## 2020-08-02 NOTE — Progress Notes (Signed)
GYNECOLOGY  VISIT   HPI: 85 y.o.   Married  Caucasian  female   G1P1 with Patient's last menstrual period was 07/21/1998 (approximate).   here for postmenopausal bleeding. Patient states this was one large amount of bright red blood x1 last week. She has only seen small amounts of dark spotting since. Had some cramping.  Seen initially by her PCP 07/27/20.  No hormone treatment.   Not sexually active.   No gynecologic problems.   Last A1C 7.6.  Hgb 14.4 on 07/27/20.  Daughter Jeannene Patella is here for discussion portion of the visit today.  GYNECOLOGIC HISTORY: Patient's last menstrual period was 07/21/1998 (approximate). Contraception:  Tubal Menopausal hormone therapy:  none Last mammogram: years ago per patient Last pap smear:   Years ago per patient        OB History    Gravida  1   Para  1   Term      Preterm      AB      Living  1     SAB      IAB      Ectopic      Multiple      Live Births                 Patient Active Problem List   Diagnosis Date Noted  . Overgrown toenails 12/15/2019  . Lumbar paraspinal muscle spasm 10/06/2019  . Stress incontinence 10/06/2019  . Macular degeneration of both eyes 10/06/2019  . Uncontrolled type 2 diabetes mellitus with hyperglycemia ([place]) 10/06/2019  . Diabetes mellitus type 2 with retinopathy ([place]) 09/21/2019  . Essential hypertension 09/21/2019  . Asymptomatic bacteriuria 09/21/2019  . Dehydration 09/13/2019    Past Medical History:  Diagnosis Date  . Allergy   . Diabetes mellitus without complication ([place])   . Jaundice    as a child  . Macular degeneration   . Varicose veins of lower extremity    right leg    Past Surgical History:  Procedure Laterality Date  . TUBAL LIGATION Bilateral     Current Outpatient Medications  Medication Sig Dispense Refill  . Continuous Blood Gluc Receiver (FREESTYLE LIBRE 14 DAY READER) DEVI 1 Units by Does not apply route 2 (two) times daily. H35.30, E11.65 1 each  2  . Continuous Blood Gluc Sensor (FREESTYLE LIBRE 14 DAY SENSOR) MISC Apply 1 sensor every 14 days E11.319 2 each 11  . Elastic Bandages & Supports (MEDICAL COMPRESSION THIGH HIGH) MISC 1 each by Does not apply route daily. 2 each 0  . famotidine (PEPCID) 10 MG tablet Take 10 mg by mouth 2 (two) times daily as needed for heartburn or indigestion.    . fexofenadine (ALLEGRA) 60 MG tablet Take 60 mg by mouth daily.    Marland Kitchen gabapentin (NEURONTIN) 100 MG capsule Take 1 capsule (100 mg total) by mouth at bedtime. 30 capsule 0  . metFORMIN (GLUCOPHAGE-XR) 500 MG 24 hr tablet TAKE 1 TABLET (500 MG TOTAL) BY MOUTH 2 (TWO) TIMES DAILY WITH A MEAL. 180 tablet 1  . Multiple Vitamins-Minerals (ICAPS AREDS 2 PO) Take by mouth in the morning and at bedtime.    . naproxen (NAPROSYN) 375 MG tablet Take 1 tablet (375 mg total) by mouth 2 (two) times daily. 20 tablet 0  . TRULICITY 4.25 ZD/6.3OV SOPN INJECT 0.75 MG INTO THE SKIN ONCE A WEEK. 2 mL 2   No current facility-administered medications for this visit.     ALLERGIES: Codeine  Family History  Problem Relation Age of Onset  . Cancer Mother   . Heart disease Father     Social History   Socioeconomic History  . Marital status: Married    Spouse name: Not on file  . Number of children: 1  . Years of education: Not on file  . Highest education level: Not on file  Occupational History  . Not on file  Tobacco Use  . Smoking status: Former Smoker    Packs/day: 0.50    Types: Cigarettes    Quit date: 03/26/2009    Years since quitting: 11.3  . Smokeless tobacco: Never Used  Vaping Use  . Vaping Use: Never used  Substance and Sexual Activity  . Alcohol use: No  . Drug use: No  . Sexual activity: Not Currently  Other Topics Concern  . Not on file  Social History Narrative   3 Step Daughters   Social Determinants of Health   Financial Resource Strain: Not on file  Food Insecurity: Not on file  Transportation Needs: Not on file  Physical  Activity: Not on file  Stress: Not on file  Social Connections: Not on file  Intimate Partner Violence: Not on file    Review of Systems  All other systems reviewed and are negative.   PHYSICAL EXAMINATION:    BP (!) 142/76   Pulse 97   Ht 5\' 4"  (1.626 m)   Wt 149 lb (67.6 kg)   LMP 07/21/1998 (Approximate)   SpO2 95%   BMI 25.58 kg/m     General appearance: alert, cooperative and appears stated age Head: Normocephalic, without obvious abnormality, atraumatic Neck: no adenopathy, supple, symmetrical, trachea midline and thyroid normal to inspection and palpation Lungs: clear to auscultation bilaterally Heart: regular rate and rhythm Abdomen: soft, non-tender, no masses,  no organomegaly Extremities: compression hose of LEs. Skin: Skin color, texture, turgor normal. No rashes or lesions No abnormal inguinal nodes palpated Neurologic: Grossly normal  Pelvic: External genitalia:  no lesions              Urethra:  normal appearing urethra with no masses, tenderness or lesions              Bartholins and Skenes: normal                 Vagina: normal appearing vagina with normal color and discharge, no lesions              Cervix: no lesions.  Dark blood from os.                Bimanual Exam:  Uterus:  normal size, contour, position, consistency, mobility, non-tender              Adnexa: no mass, fullness, tenderness              Rectal exam: Yes.  .  Confirms.              Anus:  normal sphincter tone, no lesions  Chaperone was present for exam.  ASSESSMENT  Postmenopausal bleeding.  DM.   PLAN  We discussed postmenopausal bleeding and potential etiologies - atrophy, infection, polyps, precancer and cancer.  Pap collected today.  Return for pelvic ultrasound and endometrial biopsy.   Procedures explained.  Will refer to [place] at [facility] if malignancy identified.  Questions invited and answered.

## 2020-08-02 NOTE — Patient Instructions (Addendum)
Postmenopausal Bleeding Postmenopausal bleeding is any bleeding that a woman has after she has entered menopause. Menopause is the end of a woman's fertile years. After menopause, a woman no longer ovulates and does not have menstrual periods. Therefore, she should no longer have bleeding from her vagina. Postmenopausal bleeding may have various causes, including:  Menopausal hormone therapy (MHT).  Endometrial atrophy. After menopause, low estrogen hormone levels cause the membrane that lines the uterus (endometrium) to become thin. You may have bleeding as the endometrium thins.  Endometrial hyperplasia. This condition is caused by excess estrogen hormones and low levels of progesterone hormones. The excess estrogen causes the endometrium to thicken, which can lead to bleeding. In some cases, this can lead to cancer of the uterus.  Endometrial cancer.  Noncancerous growths (polyps) on the endometrium, the lining of the uterus, or the cervix.  Uterine fibroids. These are noncancerous growths in or around the uterus muscle tissue that can cause heavy bleeding. Any type of postmenopausal bleeding, even if it appears to be a typical menstrual period, should be checked by your health care provider. Treatment will depend on the cause of the bleeding. Follow these instructions at home:  Pay attention to any changes in your symptoms. Let your health care provider know about them.  Avoid using tampons and douches as told by your health care provider.  Change your pads regularly.  Get regular pelvic exams, including Pap tests, as told by your health care provider.  Take iron supplements as told by your health care provider.  Take over-the-counter and prescription medicines only as told by your health care provider.  Keep all follow-up visits. This is important.   Contact a health care provider if:  You have new bleeding from the vagina after menopause.  You have pain in your abdomen. Get  help right away if:  You have a fever or chills.  You have severe pain with bleeding.  You are passing blood clots.  You have heavy bleeding, need more than 1 pad an hour, and have never experienced this before.  You have headaches or feel faint or dizzy. Summary  Postmenopausal bleeding is any bleeding that a woman has after she has entered into menopause.  Postmenopausal bleeding may have various causes. Treatment will depend on the cause of the bleeding.  Any type of postmenopausal bleeding, even if it appears to be a typical menstrual period, should be checked by your health care provider.  Be sure to pay attention to any changes in your symptoms and keep all follow-up visits. This information is not intended to replace advice given to you by your health care provider. Make sure you discuss any questions you have with your health care provider. Document Revised: 12/22/2019 Document Reviewed: 12/22/2019 Elsevier Patient Education  [employer].  Transvaginal Ultrasound A transvaginal ultrasound, also called an endovaginal ultrasound, is a test that uses sound waves to take pictures of the female genital tract. The pictures are taken with a device, called a transducer, that is placed in the vagina. This test may be done to:  Check for problems with your pregnancy.  Check your developing baby.  Check for anything abnormal in the uterus or ovaries.  Find out why you have pelvic pain or bleeding. Tell a health care provider about:  Any allergies you have.  All medicines you are taking, including vitamins, herbs, eye drops, creams, and over-the-counter medicines.  Any blood disorders you have.  Any surgeries you have had.  Any  medical conditions you have.  Whether you are pregnant or may be pregnant.  Whether you are having your menstrual period. What are the risks? This is a safe procedure. There are no known risks or complications of having this test. What  happens before the procedure? This procedure needs to be done when your bladder is empty. Follow your health care provider's instructions about drinking fluids and emptying your bladder before the test. What happens during the procedure?  You will empty your bladder before the procedure.  You will undress from the waist down.  You will lie down on an exam table, with your knees bent and your feet in foot holders.  A health care provider will cover the transducer with a sterile cover.  A gel will be put on the transducer. The gel helps transmit the sound waves and prevents irritation of your vagina.  The technician will insert the transducer into your vagina to get images. These will be displayed on a monitor that looks like a small television screen.  The transducer will be removed when the procedure is complete. The procedure may vary among health care providers and hospitals.   What happens after the procedure?  It is up to you to get the results of your procedure. Ask your health care provider, or the department that is doing the procedure, when your results will be ready.  Keep all follow-up visits as told by your health care provider. This is important. Summary  A transvaginal ultrasound, also called an endovaginal ultrasound, is a test that uses sound waves to take pictures of the female genital tract.  This is a safe procedure. There are no known risks associated with this test.  The procedure needs to be done when your bladder is empty. Follow your health care provider's instructions about drinking fluids and emptying your bladder before the test.  During the procedure, you will undress from the waist down and lie down on an exam table. A technician will insert a transducer into your vagina to obtain images.  Ask your health care provider, or the department that is doing the procedure, when your results will be ready. This information is not intended to replace advice given  to you by your health care provider. Make sure you discuss any questions you have with your health care provider. Document Revised: 02/17/2018 Document Reviewed: 02/17/2018 Elsevier Patient Education  2021 [employer].  Endometrial Biopsy  An endometrial biopsy is a procedure to remove tissue samples from the endometrium, which is the lining of the uterus. The tissue that is removed can then be checked under a microscope for disease. This procedure is used to diagnose conditions such as endometrial cancer, endometrial tuberculosis, polyps, or other inflammatory conditions. This procedure may also be used to investigate uterine bleeding to determine where you are in your menstrual cycle or how your hormone levels are affecting the lining of the uterus. Tell a health care provider about:  Any allergies you have.  All medicines you are taking, including vitamins, herbs, eye drops, creams, and over-the-counter medicines.  Any problems you or family members have had with anesthetic medicines.  Any blood disorders you have.  Any surgeries you have had.  Any medical conditions you have.  Whether you are pregnant or may be pregnant. What are the risks? Generally, this is a safe procedure. However, problems may occur, including:  Bleeding.  Pelvic infection.  Puncture of the wall of the uterus with the biopsy device (rare).  Allergic  reactions to medicines. What happens before the procedure?  Keep a record of your menstrual cycles as told by your health care provider. You may need to schedule your procedure for a specific time in your cycle.  You may want to bring a sanitary pad to wear after the procedure.  Plan to have someone take you home from the hospital or clinic.  Ask your health care provider about: ? Changing or stopping your regular medicines. This is especially important if you are taking diabetes medicines, arthritis medicines, or blood thinners. ? Taking medicines  such as aspirin and ibuprofen. These medicines can thin your blood. Do not take these medicines unless your health care provider tells you to take them. ? Taking over-the-counter medicines, vitamins, herbs, and supplements. What happens during the procedure?  You will lie on an exam table with your feet and legs supported as in a pelvic exam.  Your health care provider will insert an instrument (speculum) into your vagina to see your cervix.  Your cervix will be cleansed with an antiseptic solution.  A medicine (local anesthetic) will be used to numb the cervix.  A forceps instrument (tenaculum) will be used to hold your cervix steady for the biopsy.  A thin, rod-like instrument (uterine sound) will be inserted through your cervix to determine the length of your uterus and the location where the biopsy sample will be removed.  A thin, flexible tube (catheter) will be inserted through your cervix and into the uterus. The catheter will be used to collect the biopsy sample from your endometrial tissue.  The catheter and speculum will then be removed, and the tissue sample will be sent to a lab for examination. The procedure may vary among health care providers and hospitals. What can I expect after procedure?  You will rest in a recovery area until you are ready to go home.  You may have mild cramping and a small amount of vaginal bleeding. This is normal.  You may have a small amount of vaginal bleeding for a few days. This is normal.  It is up to you to get the results of your procedure. Ask your health care provider, or the department that is doing the procedure, when your results will be ready. Follow these instructions at home:  Take over-the-counter and prescription medicines only as told by your health care provider.  Do not douche, use tampons, or have sexual intercourse until your health care provider approves.  Return to your normal activities as told by your health care  provider. Ask your health care provider what activities are safe for you.  Follow instructions from your health care provider about any activity restrictions, such as restrictions on strenuous exercise or heavy lifting.  Keep all follow-up visits. This is important. Contact a health care provider:  You have heavy bleeding, or bleed for longer than 2 days after the procedure.  You have bad smelling discharge from your vagina.  You have a fever or chills.  You have a burning sensation when urinating or you have difficulty urinating.  You have severe pain in your lower abdomen. Get help right away if you:  You have severe cramps in your stomach or back.  You pass large blood clots.  Your bleeding increases.  You become weak or light-headed, or you faint or lose consciousness. Summary  An endometrial biopsy is a procedure to remove tissue samples is taken from the endometrium, which is the lining of the uterus.  The tissue sample  that is removed will be checked under a microscope for disease.  This procedure is used to diagnose conditions such as endometrial cancer, endometrial tuberculosis, polyps, or other inflammatory conditions.  After the procedure, it is common to have mild cramping and a small amount of vaginal bleeding for a few days.  Do not douche, use tampons, or have sexual intercourse until your health care provider approves. Ask your health care provider which activities are safe for you. This information is not intended to replace advice given to you by your health care provider. Make sure you discuss any questions you have with your health care provider. Document Revised: 01/30/2020 Document Reviewed: 01/30/2020 Elsevier Patient Education  2021 [employer].

## 2020-08-06 ENCOUNTER — Telehealth (INDEPENDENT_AMBULATORY_CARE_PROVIDER_SITE_OTHER): Payer: Medicare HMO | Admitting: Internal Medicine

## 2020-08-06 ENCOUNTER — Encounter: Payer: Self-pay | Admitting: Internal Medicine

## 2020-08-06 ENCOUNTER — Telehealth: Payer: Self-pay

## 2020-08-06 DIAGNOSIS — G629 Polyneuropathy, unspecified: Secondary | ICD-10-CM

## 2020-08-06 DIAGNOSIS — E785 Hyperlipidemia, unspecified: Secondary | ICD-10-CM

## 2020-08-06 DIAGNOSIS — E1169 Type 2 diabetes mellitus with other specified complication: Secondary | ICD-10-CM | POA: Diagnosis not present

## 2020-08-06 DIAGNOSIS — N939 Abnormal uterine and vaginal bleeding, unspecified: Secondary | ICD-10-CM | POA: Diagnosis not present

## 2020-08-06 DIAGNOSIS — I83811 Varicose veins of right lower extremities with pain: Secondary | ICD-10-CM

## 2020-08-06 NOTE — Telephone Encounter (Signed)
Per Dr. [name] was meant to have labs with her visit today. can you please reschedule those? also, she will need a 4 month f/u with same day labs that I'll put in, too    I tried calling patient and voicemail hasn't been set up. I will call patient again and try to schedule her.

## 2020-08-06 NOTE — Progress Notes (Signed)
This service is provided via telemedicine  No vital signs collected/recorded due to the encounter was a telemedicine visit.   Location of patient (ex: home, work): Home.   Patient consents to a telephone visit: Yes.  Location of the provider (ex: office, home): Remote  Name of any referring provider: Gayland Curry, DO   Names of all persons participating in the telemedicine service and their role in the encounter: Patient, Pam Daughter, Heriberto [place], [place], Hollace Kinnier, DO.    Time spent on call: 8 minutes spent on the phone with Medical Assistant.    Virtual Visit via Video Note  I connected with Wilfrid Lund on 08/06/20 at 11:30 AM EST by a video enabled telemedicine application and verified that I am speaking with the correct person using two identifiers.  Location: Patient: Home Provider: My home   I discussed the limitations of evaluation and management by telemedicine and the availability of in person appointments. The patient expressed understanding and agreed to proceed.  History of Present Illness: 85yo female seen for med mgt via video visit due to snow/ice.  Her daughter is with her at home.  She has been ok.  She had some bronchitis.  She is still doing some coughing but much.  It is productive--sometimes discolored and sometimes normal.  Not interfering with sleep--mucinex 600mg  does the trick.    DMII:  Doing pretty well depending on what she eats.  Had cake the other day--pound cake with icing.    Has a gyn appt coming up.  She had the bleeding and has an [place] and a scraping of her uterus.  She's had some spotting.  It appears to be old blood and a tinge of bright red occasionally.  She's had cramps, but no significant blood loss since that first day.    No dizziness or lightheadedness.  As f/u with vascular and vein on 1/27.  Does not like wearing the stockings--makes her legs feel sunburned.  She did not have DVT or arterial insufficiency.     Says that  at 41, she fell apart.  She uses a cart to get around at the store--right leg bothers her after a while.  Uses walking stick when out otherwise.    Observations/Objective: Appears a bit pale Weight remains stable.  Assessment and Plan: 1. Vaginal bleeding -has slowed down, but still having spotting and cramping -has gyn f/u--await results--hopefully benign -she's not had recent weight loss (though lost considerable amt with initial diabetes dx)  2. Controlled type 2 diabetes mellitus with other specified complication, without long-term current use of insulin (HCC) -hba1c has been improving -f/u labs at her convenience soon (could not do today as virtual visit in snowstorm) -cont current regimen and monitor  3. Varicose veins of right lower extremity with pain -cont compression stockings  4. Peripheral polyneuropathy -ongoing, continue use of cane, gabapentin for pain  5. Hyperlipidemia associated with type 2 diabetes mellitus ([place]) -has declined medication for this  Follow Up Instructions: Schedule fasting labs asap Then f/u with me with same day fasting labs   I discussed the assessment and treatment plan with the patient. The patient was provided an opportunity to ask questions and all were answered. The patient agreed with the plan and demonstrated an understanding of the instructions.   The patient was advised to call back or seek an in-person evaluation if the symptoms worsen or if the condition fails to improve as anticipated.  I provided 30 minutes  of non-face-to-face time during this encounter.   Hollace Kinnier, DO

## 2020-08-06 NOTE — Telephone Encounter (Signed)
[name], schreffler are scheduled for a virtual visit with your provider today.    Just as we do with appointments in the office, we must obtain your consent to participate.  Your consent will be active for this visit and any virtual visit you may have with one of our providers in the next 365 days.    If you have a MyChart account, I can also send a copy of this consent to you electronically.  All virtual visits are billed to your insurance company just like a traditional visit in the office.  As this is a virtual visit, video technology does not allow for your provider to perform a traditional examination.  This may limit your provider's ability to fully assess your condition.  If your provider identifies any concerns that need to be evaluated in person or the need to arrange testing such as labs, EKG, etc, we will make arrangements to do so.    Although advances in technology are sophisticated, we cannot ensure that it will always work on either your end or our end.  If the connection with a video visit is poor, we may have to switch to a telephone visit.  With either a video or telephone visit, we are not always able to ensure that we have a secure connection.   I need to obtain your verbal consent now.   Are you willing to proceed with your visit today?   [name] has provided verbal consent on 08/06/2020 for a virtual visit (video or telephone).   [place], [place] 08/06/2020  11:30 AM

## 2020-08-08 ENCOUNTER — Other Ambulatory Visit: Payer: Self-pay

## 2020-08-08 LAB — CYTOLOGY - PAP
Diagnosis: NEGATIVE
Diagnosis: REACTIVE

## 2020-08-08 NOTE — Telephone Encounter (Signed)
Patient daughter "Reina Fuse" called back and set appointment for patient. Patient has labs at on 08/10/2020. Future Labs placed. Patient has 4 month follow up 12/06/2020. Message routed to PCP Reed, [place], DO .

## 2020-08-08 NOTE — Telephone Encounter (Signed)
Sounds good. Thanks 

## 2020-08-08 NOTE — Telephone Encounter (Signed)
I called patient and spoke to both her and daughter "Barbara Golden". Patient states that she will call office back to schedule fasting labs and 4 month follow up. Patient states that she doesn't want to schedule anything right now due to the weather. She states that she's unsure how to weather will change. Patient states she would rather not have to reschedule over and over. Message routed to PCP Reed, [place], DO .

## 2020-08-10 ENCOUNTER — Other Ambulatory Visit: Payer: Self-pay

## 2020-08-15 ENCOUNTER — Other Ambulatory Visit: Payer: Self-pay

## 2020-08-15 ENCOUNTER — Other Ambulatory Visit: Payer: Medicare HMO

## 2020-08-15 DIAGNOSIS — I10 Essential (primary) hypertension: Secondary | ICD-10-CM | POA: Diagnosis not present

## 2020-08-15 DIAGNOSIS — G629 Polyneuropathy, unspecified: Secondary | ICD-10-CM | POA: Diagnosis not present

## 2020-08-15 DIAGNOSIS — E785 Hyperlipidemia, unspecified: Secondary | ICD-10-CM | POA: Diagnosis not present

## 2020-08-15 DIAGNOSIS — E1169 Type 2 diabetes mellitus with other specified complication: Secondary | ICD-10-CM | POA: Diagnosis not present

## 2020-08-15 LAB — LIPID PANEL
Cholesterol: 160 mg/dL (ref <200)
HDL: 40 mg/dL — ABNORMAL LOW (ref >=50)
LDL Cholesterol (Calc): 93 mg/dL (calc)
Non-HDL Cholesterol (Calc): 120 mg/dL (calc) (ref <130)
Total CHOL/HDL Ratio: 4 (calc) (ref <5.0)
Triglycerides: 175 mg/dL — ABNORMAL HIGH (ref <150)

## 2020-08-16 ENCOUNTER — Ambulatory Visit: Payer: Medicare HMO | Admitting: Vascular Surgery

## 2020-08-16 ENCOUNTER — Ambulatory Visit: Payer: Medicare HMO | Admitting: Internal Medicine

## 2020-08-16 ENCOUNTER — Encounter: Payer: Self-pay | Admitting: Vascular Surgery

## 2020-08-16 VITALS — BP 153/74 | HR 77 | Temp 98.1°F | Resp 14 | Ht 64.0 in | Wt 147.0 lb

## 2020-08-16 DIAGNOSIS — I83811 Varicose veins of right lower extremities with pain: Secondary | ICD-10-CM | POA: Diagnosis not present

## 2020-08-16 DIAGNOSIS — I872 Venous insufficiency (chronic) (peripheral): Secondary | ICD-10-CM | POA: Diagnosis not present

## 2020-08-16 DIAGNOSIS — M7989 Other specified soft tissue disorders: Secondary | ICD-10-CM

## 2020-08-16 LAB — BASIC METABOLIC PANEL WITH GFR
BUN: 17 mg/dL (ref 7–25)
CO2: 31 mmol/L (ref 20–32)
Calcium: 9.5 mg/dL (ref 8.6–10.4)
Chloride: 100 mmol/L (ref 98–110)
Creat: 0.7 mg/dL (ref 0.60–0.88)
GFR, Est African American: 94 mL/min/{1.73_m2} (ref >=60)
GFR, Est Non African American: 81 mL/min/{1.73_m2} (ref >=60)
Glucose, Bld: 192 mg/dL — ABNORMAL HIGH (ref 65–99)
Potassium: 4.7 mmol/L (ref 3.5–5.3)
Sodium: 139 mmol/L (ref 135–146)

## 2020-08-16 LAB — CBC WITH DIFFERENTIAL/PLATELET
Absolute Monocytes: 707 cells/uL (ref 200–950)
Basophils Absolute: 61 cells/uL (ref 0–200)
Basophils Relative: 0.6 %
Eosinophils Absolute: 121 cells/uL (ref 15–500)
Eosinophils Relative: 1.2 %
HCT: 43.8 % (ref 35.0–45.0)
Hemoglobin: 14.7 g/dL (ref 11.7–15.5)
Lymphs Abs: 2384 cells/uL (ref 850–3900)
MCH: 30.5 pg (ref 27.0–33.0)
MCHC: 33.6 g/dL (ref 32.0–36.0)
MCV: 90.9 fL (ref 80.0–100.0)
MPV: 10.2 fL (ref 7.5–12.5)
Monocytes Relative: 7 %
Neutro Abs: 6828 cells/uL (ref 1500–7800)
Neutrophils Relative %: 67.6 %
Platelets: 423 10*3/uL — ABNORMAL HIGH (ref 140–400)
RBC: 4.82 10*6/uL (ref 3.80–5.10)
RDW: 12 % (ref 11.0–15.0)
Total Lymphocyte: 23.6 %
WBC: 10.1 10*3/uL (ref 3.8–10.8)

## 2020-08-16 LAB — HEMOGLOBIN A1C
Hgb A1c MFr Bld: 8 % of total Hgb — ABNORMAL HIGH (ref <5.7)
Mean Plasma Glucose: 183 mg/dL
eAG (mmol/L): 10.1 mmol/L

## 2020-08-16 NOTE — Progress Notes (Signed)
Sugar average has increased to 8.  I recommend we increase her metformin to 1000mg  po bid if she can tolerate this.  Bad cholesterol is above goal of less than 70 at 93 and triglycerides are also high.  Statin therapy is recommended for this to help prevent heart attacks and strokes.  Is she willing to take a medication? Kidneys and electrolytes were fine.   Platelets were slightly high and we'll monitor them.

## 2020-08-16 NOTE — Progress Notes (Signed)
REASON FOR VISIT:   Follow-up of chronic venous insufficiency  MEDICAL ISSUES:   CHRONIC VENOUS INSUFFICIENCY: This patient is continuing to have symptoms related to her venous disease despite conservative treatment including leg elevation and thigh-high compression stockings.  However currently her symptoms are tolerable.  I have encouraged her to continue to elevate her legs and we have again reinforced the correct position for this.  She will continue to wear her compression stockings.  I encouraged her to avoid prolonged sitting and standing.  We discussed importance of exercise specifically walking and water aerobics.  If her symptoms progress I think she would be a good candidate for laser ablation of right great saphenous vein.  We have discussed the indications for the procedure and the potential complications including 1% risk of DVT and 1% risk of nonclosure of the vein.  She will call if she elects to proceed with laser ablation if her symptoms do not improve significantly.  HPI:   Barbara Golden is a pleasant 85 y.o. female who was seen by Orlean Patten, PA on 05/17/2020. The patient was having right leg pain and right thigh pain that was worse with walking. The patient complained of hip esthesia on the inner thigh. She was having aching pain and a tired feeling in her right leg which is aggravated by standing and ambulating and relieved with elevation. She has been wearing thigh-high compression stockings with a gradient of 15 to 20 mmHg. She had no previous history of DVT and no previous venous procedures. On exam she had a palpable right posterior tibial pulse and a left dorsalis pedis pulse. Her venous duplex scan of the right lower extremity showed no evidence of DVT. She had reflux in the great saphenous vein from the proximal calf to the mid thigh. She also had deep venous reflux. She was encouraged to wear thigh-high compression stockings with a gradient of 20 to 30 mmHg, elevate  her legs, and exercise. She comes in for 39-month follow-up visit.  She continues to have hypesthesia along the medial aspect of her right leg.  She describes some aching pain in her right leg which is aggravated by standing and relieved with elevation.  She has been wearing her thigh-high compression stockings.  She does take Aleve occasionally for pain.  She has had no previous venous procedures and no previous history of DVT.  She describes some occasional pain in her calves with ambulation but the symptoms do not appear to be consistent.  She denies any history of rest pain or nonhealing ulcers.  Her risk factors for peripheral vascular disease include type 2 diabetes, hypertension, and a remote history of tobacco use.  Past Medical History:  Diagnosis Date  . Allergy   . Diabetes mellitus without complication (HCC)   . Jaundice    as a child  . Macular degeneration   . Varicose veins of lower extremity    right leg    Family History  Problem Relation Age of Onset  . Cancer Mother   . Heart disease Father     SOCIAL HISTORY: Social History   Tobacco Use  . Smoking status: Former Smoker    Packs/day: 0.50    Types: Cigarettes    Quit date: 03/26/2009    Years since quitting: 11.4  . Smokeless tobacco: Never Used  Substance Use Topics  . Alcohol use: No    Allergies  Allergen Reactions  . Codeine Other (See Comments)    Causes  lip burning and hyperactivity    Current Outpatient Medications  Medication Sig Dispense Refill  . Continuous Blood Gluc Receiver (FREESTYLE LIBRE 14 DAY READER) DEVI 1 Units by Does not apply route 2 (two) times daily. H35.30, E11.65 1 each 2  . Continuous Blood Gluc Sensor (FREESTYLE LIBRE 14 DAY SENSOR) MISC Apply 1 sensor every 14 days E11.319 2 each 11  . Elastic Bandages & Supports (MEDICAL COMPRESSION THIGH HIGH) MISC 1 each by Does not apply route daily. 2 each 0  . famotidine (PEPCID) 10 MG tablet Take 10 mg by mouth 2 (two) times daily  as needed for heartburn or indigestion.    . fexofenadine (ALLEGRA) 60 MG tablet Take 60 mg by mouth daily.    Marland Kitchen gabapentin (NEURONTIN) 100 MG capsule Take 1 capsule (100 mg total) by mouth at bedtime. 30 capsule 0  . metFORMIN (GLUCOPHAGE-XR) 500 MG 24 hr tablet TAKE 1 TABLET (500 MG TOTAL) BY MOUTH 2 (TWO) TIMES DAILY WITH A MEAL. 180 tablet 1  . Multiple Vitamins-Minerals (ICAPS AREDS 2 PO) Take by mouth in the morning and at bedtime.    . TRULICITY 9.32 TF/5.7DU SOPN INJECT 0.75 MG INTO THE SKIN ONCE A WEEK. 2 mL 2   No current facility-administered medications for this visit.    REVIEW OF SYSTEMS:  [X]  denotes positive finding, [ ]  denotes negative finding Cardiac  Comments:  Chest pain or chest pressure:    Shortness of breath upon exertion:    Short of breath when lying flat:    Irregular heart rhythm:        Vascular    Pain in calf, thigh, or hip brought on by ambulation:    Pain in feet at night that wakes you up from your sleep:     Blood clot in your veins:    Leg swelling:         Pulmonary    Oxygen at home:    Productive cough:     Wheezing:         Neurologic    Sudden weakness in arms or legs:     Sudden numbness in arms or legs:     Sudden onset of difficulty speaking or slurred speech:    Temporary loss of vision in one eye:     Problems with dizziness:         Gastrointestinal    Blood in stool:     Vomited blood:         Genitourinary    Burning when urinating:     Blood in urine:        Psychiatric    Major depression:         Hematologic    Bleeding problems: x   Problems with blood clotting too easily:        Skin    Rashes or ulcers:        Constitutional    Fever or chills:     PHYSICAL EXAM:   Vitals:   08/16/20 1434  BP: (!) 153/74  Pulse: 77  Resp: 14  Temp: 98.1 F (36.7 C)  TempSrc: Temporal  SpO2: 98%  Weight: 147 lb (66.7 kg)  Height: 5\' 4"  (1.626 m)    GENERAL: The patient is a well-nourished female, in no acute  distress. The vital signs are documented above. CARDIAC: There is a regular rate and rhythm.  VASCULAR: I do not detect carotid bruits. She has palpable posterior tibial pulses bilaterally. She has  biphasic posterior tibial signals with the Doppler and monophasic dorsalis pedis signals with a Doppler. I looked at her right great saphenous vein myself with the SonoSite.  The vein is significantly dilated throughout with reflux from the saphenofemoral junction to the proximal calf. PULMONARY: There is good air exchange bilaterally without wheezing or rales. ABDOMEN: Soft and non-tender with normal pitched bowel sounds.  MUSCULOSKELETAL: There are no major deformities or cyanosis. NEUROLOGIC: No focal weakness or paresthesias are detected. SKIN: There are no ulcers or rashes noted. PSYCHIATRIC: The patient has a normal affect.  DATA:    VENOUS REFLUX TEST: I reviewed her venous reflux test that was done on 05/17/2020. This was of the right lower extremity only. She had no evidence of DVT and no evidence of superficial venous thrombosis. She had deep venous reflux involving the common femoral vein. She had superficial venous reflux in the great saphenous vein from the saphenofemoral junction to the proximal calf. Diameters range from 0.45-0.56 cm.  Deitra [facility] [phone]

## 2020-08-17 ENCOUNTER — Other Ambulatory Visit: Payer: Self-pay

## 2020-08-17 MED ORDER — ROSUVASTATIN CALCIUM 5 MG PO TABS: 5.0000 mg | ORAL_TABLET | Freq: Every day | ORAL | 1 refills | Status: DC

## 2020-08-17 MED ORDER — METFORMIN HCL 1000 MG PO TABS: 1000.0000 mg | ORAL_TABLET | Freq: Two times a day (BID) | ORAL | 3 refills | Status: DC

## 2020-08-17 NOTE — Addendum Note (Signed)
Addended by: Logan Bores on: 08/17/2020 11:13 AM   Modules accepted: Orders

## 2020-08-17 NOTE — Progress Notes (Signed)
I recommend crestor 5mg  nightly for cholesterol. Not sure why her platelets are low.  Sometimes this is "idiopathic".  If it progresses, we will look into it more by looking at a blood smear or referral to hematology, but we're not at that point now.

## 2020-08-21 ENCOUNTER — Encounter: Payer: Self-pay | Admitting: *Deleted

## 2020-08-21 DIAGNOSIS — H25013 Cortical age-related cataract, bilateral: Secondary | ICD-10-CM | POA: Diagnosis not present

## 2020-08-21 DIAGNOSIS — H524 Presbyopia: Secondary | ICD-10-CM | POA: Diagnosis not present

## 2020-08-21 DIAGNOSIS — H353132 Nonexudative age-related macular degeneration, bilateral, intermediate dry stage: Secondary | ICD-10-CM | POA: Diagnosis not present

## 2020-08-21 DIAGNOSIS — H52203 Unspecified astigmatism, bilateral: Secondary | ICD-10-CM | POA: Diagnosis not present

## 2020-08-21 DIAGNOSIS — E1136 Type 2 diabetes mellitus with diabetic cataract: Secondary | ICD-10-CM | POA: Diagnosis not present

## 2020-08-21 DIAGNOSIS — H2513 Age-related nuclear cataract, bilateral: Secondary | ICD-10-CM | POA: Diagnosis not present

## 2020-08-21 LAB — HM DIABETES EYE EXAM

## 2020-08-22 NOTE — Progress Notes (Signed)
GYNECOLOGY  VISIT   HPI: 85 y.o.   Married  Caucasian  female   G1P1 with Patient's last menstrual period was 07/21/1998 (approximate).   here for PUS and EMB.   Daughter is present today.   She continues to have postmenopausal bleeding.  Spotting only now.  Looks like old blood.   No current pain.  Has had some cramping.   GYNECOLOGIC HISTORY: Patient's last menstrual period was 07/21/1998 (approximate). Contraception: Tubal/PMP Menopausal hormone therapy:  none Last mammogram: years ago per patient Last pap smear:  08-02-20 Neg        OB History    Gravida  1   Para  1   Term      Preterm      AB      Living  1     SAB      IAB      Ectopic      Multiple      Live Births                 Patient Active Problem List   Diagnosis Date Noted  . Overgrown toenails 12/15/2019  . Lumbar paraspinal muscle spasm 10/06/2019  . Stress incontinence 10/06/2019  . Macular degeneration of both eyes 10/06/2019  . Uncontrolled type 2 diabetes mellitus with hyperglycemia ([place]) 10/06/2019  . Diabetes mellitus type 2 with retinopathy ([place]) 09/21/2019  . Essential hypertension 09/21/2019  . Asymptomatic bacteriuria 09/21/2019  . Dehydration 09/13/2019    Past Medical History:  Diagnosis Date  . Allergy   . Diabetes mellitus without complication ([place])   . Jaundice    as a child  . Macular degeneration   . Varicose veins of lower extremity    right leg    Past Surgical History:  Procedure Laterality Date  . TUBAL LIGATION Bilateral     Current Outpatient Medications  Medication Sig Dispense Refill  . Continuous Blood Gluc Receiver (FREESTYLE LIBRE 14 DAY READER) DEVI 1 Units by Does not apply route 2 (two) times daily. H35.30, E11.65 1 each 2  . Continuous Blood Gluc Sensor (FREESTYLE LIBRE 14 DAY SENSOR) MISC Apply 1 sensor every 14 days E11.319 2 each 11  . Elastic Bandages & Supports (MEDICAL COMPRESSION THIGH HIGH) MISC 1 each by Does not apply route  daily. 2 each 0  . famotidine (PEPCID) 10 MG tablet Take 10 mg by mouth 2 (two) times daily as needed for heartburn or indigestion.    . fexofenadine (ALLEGRA) 60 MG tablet Take 60 mg by mouth daily.    Marland Kitchen gabapentin (NEURONTIN) 100 MG capsule Take 1 capsule (100 mg total) by mouth at bedtime. 30 capsule 0  . metFORMIN (GLUCOPHAGE) 1000 MG tablet Take 1 tablet (1,000 mg total) by mouth 2 (two) times daily with a meal. 180 tablet 3  . Multiple Vitamins-Minerals (ICAPS AREDS 2 PO) Take by mouth in the morning and at bedtime.    . rosuvastatin (CRESTOR) 5 MG tablet Take 1 tablet (5 mg total) by mouth daily. 90 tablet 1  . TRULICITY 9.60 AV/4.0JW SOPN INJECT 0.75 MG INTO THE SKIN ONCE A WEEK. 2 mL 2   No current facility-administered medications for this visit.     ALLERGIES: Codeine  Family History  Problem Relation Age of Onset  . Cancer Mother   . Heart disease Father     Social History   Socioeconomic History  . Marital status: Married    Spouse name: Not on file  .  Number of children: 1  . Years of education: Not on file  . Highest education level: Not on file  Occupational History  . Not on file  Tobacco Use  . Smoking status: Former Smoker    Packs/day: 0.50    Types: Cigarettes    Quit date: 03/26/2009    Years since quitting: 11.4  . Smokeless tobacco: Never Used  Vaping Use  . Vaping Use: Never used  Substance and Sexual Activity  . Alcohol use: No  . Drug use: No  . Sexual activity: Not Currently  Other Topics Concern  . Not on file  Social History Narrative   3 Step Daughters   Social Determinants of Health   Financial Resource Strain: Not on file  Food Insecurity: Not on file  Transportation Needs: Not on file  Physical Activity: Not on file  Stress: Not on file  Social Connections: Not on file  Intimate Partner Violence: Not on file    Review of Systems  All other systems reviewed and are negative.   PHYSICAL EXAMINATION:    BP (!) 160/74    Pulse 100   Ht 5\' 4"  (1.626 m)   Wt 149 lb (67.6 kg)   LMP 07/21/1998 (Approximate)   SpO2 100%   BMI 25.58 kg/m     General appearance: alert, cooperative and appears stated age   Pelvic US  Uterus with no myometrial masses.  EMS 17.9 mm.  Mass 39 x 17 mm with vascularity and feeder vessel.  Ovaries normal.  No free fluid.  No adnexal masses.   EMB: Consent for procedure. Sterile prep with Hibiclens Paracervical block with 1% lidocaine 10 cc, lot number [id number], expiration 01/2024 Tenaculum to anterior cervical lip. Pipelle passed to 7 cm twice.   Tissue to pathology.  Minimal EBL. No complications.   ASSESSMENT  Postmenpausal bleeding.  Thickened endometrium.  PLAN  Pelvic US findings and images reviewed.  FU EMB.  Post biopsy precautions given.  Final plan to follow.

## 2020-08-23 ENCOUNTER — Ambulatory Visit: Payer: Medicare HMO | Admitting: Obstetrics and Gynecology

## 2020-08-23 ENCOUNTER — Other Ambulatory Visit: Payer: Self-pay

## 2020-08-23 ENCOUNTER — Other Ambulatory Visit (HOSPITAL_COMMUNITY)
Admission: RE | Admit: 2020-08-23 | Discharge: 2020-08-23 | Disposition: A | Discharge status: Home / Self Care | Payer: Medicare HMO | Source: Ambulatory Visit | Attending: Obstetrics and Gynecology | Admitting: Obstetrics and Gynecology

## 2020-08-23 ENCOUNTER — Ambulatory Visit (INDEPENDENT_AMBULATORY_CARE_PROVIDER_SITE_OTHER): Payer: Medicare HMO

## 2020-08-23 ENCOUNTER — Encounter: Payer: Self-pay | Admitting: Obstetrics and Gynecology

## 2020-08-23 VITALS — BP 160/74 | HR 100 | Ht 64.0 in | Wt 149.0 lb

## 2020-08-23 DIAGNOSIS — C541 Malignant neoplasm of endometrium: Secondary | ICD-10-CM | POA: Insufficient documentation

## 2020-08-23 DIAGNOSIS — N950 Postmenopausal bleeding: Secondary | ICD-10-CM | POA: Diagnosis not present

## 2020-08-23 DIAGNOSIS — R9389 Abnormal findings on diagnostic imaging of other specified body structures: Secondary | ICD-10-CM

## 2020-08-23 NOTE — Patient Instructions (Signed)

## 2020-08-27 ENCOUNTER — Other Ambulatory Visit: Payer: Self-pay

## 2020-08-27 ENCOUNTER — Ambulatory Visit: Payer: Medicare HMO | Admitting: Internal Medicine

## 2020-08-28 ENCOUNTER — Encounter: Payer: Self-pay | Admitting: Obstetrics and Gynecology

## 2020-08-28 ENCOUNTER — Telehealth: Payer: Self-pay | Admitting: Obstetrics and Gynecology

## 2020-08-28 NOTE — Telephone Encounter (Signed)
Phone call attempt to reach patient at [phone] regarding endometrial biopsy results.   No answer and voice mail is not set up.   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   Patient will need referral to GYN ONC, first available at the [facility].  She and her daughter Barbara Golden are aware that this could be a possibility.   Please assist in contacting her so that I may speak with her.

## 2020-08-29 ENCOUNTER — Telehealth: Payer: Self-pay | Admitting: Obstetrics and Gynecology

## 2020-08-29 ENCOUNTER — Encounter: Payer: Self-pay | Admitting: Gynecologic Oncology

## 2020-08-29 NOTE — Telephone Encounter (Signed)
Phone call with patient and her daughter, Jeannene Patella.   Endometrial biopsy is showing endometrioid adenocarcinoma, FIGO grade 1.   Patient agrees to referral to [place] first available the [facility].   Please schedule this appointment.

## 2020-08-29 NOTE — Telephone Encounter (Signed)
Sent staff message to [place] at [employer] office, she will call to schedule.

## 2020-08-29 NOTE — Telephone Encounter (Signed)
Patient scheduled on 08/30/20 with Dr. Denman George.

## 2020-08-30 ENCOUNTER — Inpatient Hospital Stay: Payer: Medicare HMO | Attending: Gynecologic Oncology | Admitting: Gynecologic Oncology

## 2020-08-30 ENCOUNTER — Other Ambulatory Visit: Payer: Self-pay

## 2020-08-30 ENCOUNTER — Encounter: Payer: Self-pay | Admitting: Gynecologic Oncology

## 2020-08-30 ENCOUNTER — Other Ambulatory Visit: Payer: Self-pay | Admitting: Gynecologic Oncology

## 2020-08-30 VITALS — BP 124/57 | HR 90 | Temp 97.1°F | Resp 18 | Ht 64.0 in | Wt 147.7 lb

## 2020-08-30 DIAGNOSIS — E1136 Type 2 diabetes mellitus with diabetic cataract: Secondary | ICD-10-CM | POA: Diagnosis not present

## 2020-08-30 DIAGNOSIS — Z7984 Long term (current) use of oral hypoglycemic drugs: Secondary | ICD-10-CM | POA: Diagnosis not present

## 2020-08-30 DIAGNOSIS — C541 Malignant neoplasm of endometrium: Secondary | ICD-10-CM | POA: Diagnosis not present

## 2020-08-30 DIAGNOSIS — K219 Gastro-esophageal reflux disease without esophagitis: Secondary | ICD-10-CM | POA: Insufficient documentation

## 2020-08-30 DIAGNOSIS — E7800 Pure hypercholesterolemia, unspecified: Secondary | ICD-10-CM | POA: Diagnosis not present

## 2020-08-30 DIAGNOSIS — Z87891 Personal history of nicotine dependence: Secondary | ICD-10-CM | POA: Insufficient documentation

## 2020-08-30 DIAGNOSIS — Z79899 Other long term (current) drug therapy: Secondary | ICD-10-CM | POA: Diagnosis not present

## 2020-08-30 LAB — SURGICAL PATHOLOGY

## 2020-08-30 MED ORDER — SENNOSIDES-DOCUSATE SODIUM 8.6-50 MG PO TABS: 2.0000 | ORAL_TABLET | Freq: Every day | ORAL | 0 refills | Status: DC

## 2020-08-30 MED ORDER — TRAMADOL HCL 50 MG PO TABS: 50.0000 mg | ORAL_TABLET | Freq: Four times a day (QID) | ORAL | 0 refills | Status: DC | PRN

## 2020-08-30 NOTE — Patient Instructions (Signed)
Preparing for your Surgery  Plan for surgery on September 11, 2020 with Dr. Everitt Amber at [facility] will be scheduled for a robotic assisted total laparoscopic hysterectomy (removal of the uterus and cervix), bilateral salpingo-oophorectomy (removal of both ovaries and fallopian tubes), sentinel lymph node biopsy, possible lymph node dissection.   Pre-operative Testing -You will receive a phone call from presurgical testing at [facility] to arrange for a pre-operative appointment, labs, and COVID test. The COVID test normally happens 3 days prior to the surgery and they ask that you self quarantine after the test up until surgery to decrease chance of exposure.  -Bring your insurance card, copy of an advanced directive if applicable, medication list  -At that visit, you will be asked to sign a consent for a possible blood transfusion in case a transfusion becomes necessary during surgery.  The need for a blood transfusion is rare but having consent is a necessary part of your care.     -You should not be taking blood thinners or aspirin at least ten days prior to surgery unless instructed by your surgeon.  -Do not take supplements such as fish oil (omega 3), red yeast rice, turmeric before your surgery. You want to avoid medications with aspirin in them including headache powders such as BC or Goody's), Excedrin migraine.  Day Before Surgery at [place] will be asked to take in a light diet the day before surgery. You will be advised you can have clear liquids up until 3 hours before your surgery.    Eat a light diet the day before surgery.  Examples including soups, broths, toast, yogurt, mashed potatoes.  AVOID GAS PRODUCING FOODS. Things to avoid include carbonated beverages (fizzy beverages, sodas), raw fruits and raw vegetables (uncooked), or beans.   If your bowels are filled with gas, your surgeon will have difficulty visualizing your pelvic organs which increases  your surgical risks.  Your role in recovery Your role is to become active as soon as directed by your doctor, while still giving yourself time to heal.  Rest when you feel tired. You will be asked to do the following in order to speed your recovery:  - Cough and breathe deeply. This helps to clear and expand your lungs and can prevent pneumonia after surgery.  - [place]. Do mild physical activity. Walking or moving your legs help your circulation and body functions return to normal. Do not try to get up or walk alone the first time after surgery.   -If you develop swelling on one leg or the other, pain in the back of your leg, redness/warmth in one of your legs, please call the office or go to the Emergency Room to have a doppler to rule out a blood clot. For shortness of breath, chest pain-seek care in the Emergency Room as soon as possible. - Actively manage your pain. Managing your pain lets you move in comfort. We will ask you to rate your pain on a scale of zero to 10. It is your responsibility to tell your doctor or nurse where and how much you hurt so your pain can be treated.  Special Considerations -If you are diabetic, you may be placed on insulin after surgery to have closer control over your blood sugars to promote healing and recovery.  This does not mean that you will be discharged on insulin.  If applicable, your oral antidiabetics will be resumed when you are tolerating  a solid diet.  -Your final pathology results from surgery should be available around one week after surgery and the results will be relayed to you when available.  -Dr. Lahoma Crocker is the surgeon that assists your GYN Oncologist with surgery.  If you end up staying the night, the next day after your surgery you will either see Dr. Denman George, Dr. Berline Lopes, or Dr. Lahoma Crocker.  -FMLA forms can be faxed to [phone] and please allow 5-7 business days for completion.  Pain Management  After Surgery -You have been prescribed your pain medication and bowel regimen medications before surgery so that you can have these available when you are discharged from the hospital. The pain medication is for use ONLY AFTER surgery and a new prescription will not be given.   -Make sure that you have Tylenol and Ibuprofen at home to use on a regular basis after surgery for pain control. We recommend alternating the medications every hour to six hours since they work differently and are processed in the body differently for pain relief.  -Review the attached handout on narcotic use and their risks and side effects.   Bowel Regimen -You have been prescribed Sennakot-S to take nightly to prevent constipation especially if you are taking the narcotic pain medication intermittently.  It is important to prevent constipation and drink adequate amounts of liquids. You can stop taking this medication when you are not taking pain medication and you are back on your normal bowel routine.  Risks of Surgery Risks of surgery are low but include bleeding, infection, damage to surrounding structures, re-operation, blood clots, and very rarely death.   Blood Transfusion Information (For the consent to be signed before surgery)  We will be checking your blood type before surgery so in case of emergencies, we will know what type of blood you would need.                                            WHAT IS A BLOOD TRANSFUSION?  A transfusion is the replacement of blood or some of its parts. Blood is made up of multiple cells which provide different functions.  Red blood cells carry oxygen and are used for blood loss replacement.  White blood cells fight against infection.  Platelets control bleeding.  Plasma helps clot blood.  Other blood products are available for specialized needs, such as hemophilia or other clotting disorders. BEFORE THE TRANSFUSION  Who gives blood for transfusions?   You may be  able to donate blood to be used at a later date on yourself (autologous donation).  Relatives can be asked to donate blood. This is generally not any safer than if you have received blood from a stranger. The same precautions are taken to ensure safety when a relative's blood is donated.  Healthy volunteers who are fully evaluated to make sure their blood is safe. This is blood bank blood. Transfusion therapy is the safest it has ever been in the practice of medicine. Before blood is taken from a donor, a complete history is taken to make sure that person has no history of diseases nor engages in risky social behavior (examples are intravenous drug use or sexual activity with multiple partners). The donor's travel history is screened to minimize risk of transmitting infections, such as malaria. The donated blood is tested for signs of infectious diseases, such as  HIV and hepatitis. The blood is then tested to be sure it is compatible with you in order to minimize the chance of a transfusion reaction. If you or a relative donates blood, this is often done in anticipation of surgery and is not appropriate for emergency situations. It takes many days to process the donated blood. RISKS AND COMPLICATIONS Although transfusion therapy is very safe and saves many lives, the main dangers of transfusion include:   Getting an infectious disease.  Developing a transfusion reaction. This is an allergic reaction to something in the blood you were given. Every precaution is taken to prevent this. The decision to have a blood transfusion has been considered carefully by your caregiver before blood is given. Blood is not given unless the benefits outweigh the risks.  AFTER SURGERY INSTRUCTIONS  Return to work: 4 weeks if applicable  Activity: 1. Be up and out of the bed during the day.  Take a nap if needed.  You may walk up steps but be careful and use the hand rail.  Stair climbing will tire you more than you  think, you may need to stop part way and rest.   2. No lifting or straining for 6 weeks over 10 pounds. No pushing, pulling, straining for 6 weeks.  3. No driving for around 1 week(s) if you were cleared to drive before surgery.  Do not drive if you are taking narcotic pain medicine and make sure that your reaction time has returned.   4. You can shower as soon as the next day after surgery. Shower daily.  Use your regular soap and water (not directly on the incision) and pat your incision(s) dry afterwards; don't rub.  No tub baths or submerging your body in water until cleared by your surgeon. If you have the soap that was given to you by pre-surgical testing that was used before surgery, you do not need to use it afterwards because this can irritate your incisions.   5. No sexual activity and nothing in the vagina for 8 weeks.  6. You may experience a small amount of clear drainage from your incisions, which is normal.  If the drainage persists, increases, or changes color please call the office.  7. Do not use creams, lotions, or ointments such as neosporin on your incisions after surgery until advised by your surgeon because they can cause removal of the dermabond glue on your incisions.    8. You may experience vaginal spotting after surgery or around the 6-8 week mark from surgery when the stitches at the top of the vagina begin to dissolve.  The spotting is normal but if you experience heavy bleeding, call our office.  9. Take Tylenol or ibuprofen first for pain and only use narcotic pain medication for severe pain not relieved by the Tylenol or Ibuprofen.  Monitor your Tylenol intake to a max of 4,000 mg in a 24 hour period. You can alternate these medications after surgery.  Diet: 1. Low sodium Heart Healthy Diet is recommended but you are cleared to resume your normal (before surgery) diet after your procedure.  2. It is safe to use a laxative, such as Miralax or Colace, if you have  difficulty moving your bowels. You have been prescribed Sennakot at bedtime every evening to keep bowel movements regular and to prevent constipation.    Wound Care: 1. Keep clean and dry.  Shower daily.  Reasons to call the Doctor:  Fever - Oral temperature greater than  100.4 degrees Fahrenheit  Foul-smelling vaginal discharge  Difficulty urinating  Nausea and vomiting  Increased pain at the site of the incision that is unrelieved with pain medicine.  Difficulty breathing with or without chest pain  New calf pain especially if only on one side  Sudden, continuing increased vaginal bleeding with or without clots.   Contacts: For questions or concerns you should contact:  Dr. Everitt Amber at [phone]  Joylene John, NP at [phone]  After Hours: call [phone] and have the GYN Oncologist paged/contacted (after 5 pm or on the weekends).  Messages sent via mychart are for non-urgent matters and are not responded to after hours so for urgent needs, please call the after hours number.

## 2020-08-30 NOTE — Progress Notes (Signed)
Consult Note: Gyn-Onc  Consult was requested by Dr. Quincy Simmonds for the evaluation of Barbara Golden 85 y.o. female  CC:  Chief Complaint  Patient presents with  . Endometrial cancer [facility])    Assessment/Plan:  [name]  is a 85 y.o.  year old 74 with grade 1 endometrial cancer.  A detailed discussion was held with the patient with regard to to her endometrial cancer diagnosis. We discussed the standard management options for uterine cancer which includes surgery followed possibly by adjuvant therapy depending on the results of surgery. The surgical management include a robotic assisted total hysterectomy and removal of the tubes and ovaries with sampling of lymph nodes. If a minimally invasive approach is not feasible, a laparotomy may be necessary (including for specimen delivery). The patient has been counseled about these surgical options and the risks of surgery in general including infection, bleeding, damage to surrounding structures (including bowel, bladder, ureters, nerves or vessels), and the postoperative risks of PE/ DVT, and lymphedema. After counseling and consideration of her options, she is in agreement to proceed with robotic assisted total hysterectomy with bilateral sapingo-oophorectomy and SLN biopsy.   She will be seen by anesthesia for preoperative clearance and discussion of postoperative pain management.  She was given the opportunity to ask questions, which were answered to her satisfaction, and she is agreement with the above mentioned plan of care.   We explained that robotic hysterectomy is typically an outpatient procedure with same day discharge provided that she is meeting appropriate discharge criteria from the PACU. We provided extensive counseling regarding post-operative expectations for recovery and restrictions/limitations. We provided information regarding multi-modal pain therapy and the importance of avoidance of opioids.   We explained that after  surgery we will review her definitive pathology and determine if adjuvant therapy is recommended.   HPI: [name] is a 85 year old P1 who was seen in consultation at the request of Dr Quincy Simmonds for evaluation and treatment of grade 1 endometrial cancer.   Her symptoms began in the first week of February, 2022 with postmenopausal bleeding.  She saw Dr Quincy Simmonds for a symptom visit on 08/23/20.  Work-up of symptoms included a transvaginal US and endometrial biopsy. Transvaginal US on 08/23/20 showed a uterus measurng 7.5x3x3.9cm with a 17.51mm endometrial stripe. The ovaries were normal bilaterally. Endometrial sampling with endometrial pipelle was performed on 08/23/20 and showed grade 1 endometrial cancer.   She has a recent diagnosis of type II diabetes mellitus for which she is treated with metformin and trulicity (and diet). Her most recent HbA1c was 8% on 08/15/20. Her metformin dose was increased after this. She also has hypercholesterolemia and diabetic retinopathy.  Her only prior abdominal surgery was a tubal ligation. She had one prior vaginal birth. Her mother died from colon cancer at age 47. The patient lives with her daughter.   Current Meds:  Outpatient Encounter Medications as of 08/30/2020  Medication Sig  . Continuous Blood Gluc Receiver (FREESTYLE LIBRE 14 DAY READER) DEVI 1 Units by Does not apply route 2 (two) times daily. H35.30, E11.65  . Continuous Blood Gluc Sensor (FREESTYLE LIBRE 14 DAY SENSOR) MISC Apply 1 sensor every 14 days E11.319  . Elastic Bandages & Supports (MEDICAL COMPRESSION THIGH HIGH) MISC 1 each by Does not apply route daily.  . famotidine (PEPCID) 10 MG tablet Take 10 mg by mouth 2 (two) times daily as needed for heartburn or indigestion.  . fexofenadine (ALLEGRA) 60 MG tablet Take  60 mg by mouth daily.  Marland Kitchen gabapentin (NEURONTIN) 100 MG capsule Take 1 capsule (100 mg total) by mouth at bedtime.  . metFORMIN (GLUCOPHAGE) 1000 MG tablet Take 1 tablet (1,000  mg total) by mouth 2 (two) times daily with a meal.  . Multiple Vitamins-Minerals (ICAPS AREDS 2 PO) Take by mouth in the morning and at bedtime.  . rosuvastatin (CRESTOR) 5 MG tablet Take 1 tablet (5 mg total) by mouth daily.  . TRULICITY 3.47 QQ/5.9DG SOPN INJECT 0.75 MG INTO THE SKIN ONCE A WEEK.  . [DISCONTINUED] cetirizine (ZYRTEC) 10 MG tablet Take 10 mg by mouth daily as needed for allergies.  . [DISCONTINUED] ranitidine (ZANTAC) 150 MG tablet Take 150 mg by mouth daily as needed for heartburn.   No facility-administered encounter medications on file as of 08/30/2020.    Allergy:  Allergies  Allergen Reactions  . Codeine Other (See Comments)    Causes lip burning and hyperactivity    Social Hx:   Social History   Socioeconomic History  . Marital status: Widowed    Spouse name: Not on file  . Number of children: 1  . Years of education: Not on file  . Highest education level: Not on file  Occupational History  . Not on file  Tobacco Use  . Smoking status: Former Smoker    Packs/day: 0.50    Types: Cigarettes    Quit date: 03/26/2009    Years since quitting: 11.4  . Smokeless tobacco: Never Used  Vaping Use  . Vaping Use: Never used  Substance and Sexual Activity  . Alcohol use: No  . Drug use: No  . Sexual activity: Not Currently  Other Topics Concern  . Not on file  Social History Narrative   3 Step Daughters/lives with Daughter(Pam Bouwer) and son-in-law.   Social Determinants of Health   Financial Resource Strain: Not on file  Food Insecurity: Not on file  Transportation Needs: Not on file  Physical Activity: Not on file  Stress: Not on file  Social Connections: Not on file  Intimate Partner Violence: Not on file    Past Surgical Hx:  Past Surgical History:  Procedure Laterality Date  . TUBAL LIGATION Bilateral     Past Medical Hx:  Past Medical History:  Diagnosis Date  . Allergy   . Cataract   . Diabetes mellitus without complication ([place])    . GERD (gastroesophageal reflux disease)   . Jaundice    as a child  . Macular degeneration   . Varicose veins of lower extremity    right leg    Past Gynecological History:  See HPI, she has had 1 prior vaginal delivery Patient's last menstrual period was 07/21/1998 (approximate).  Family Hx:  Family History  Problem Relation Age of Onset  . Cancer Mother        colon/stomach  . Heart disease Father   . Cancer Nephew        bile duct    Review of Systems:  Constitutional  Feels well,    ENT Normal appearing ears and nares bilaterally Skin/Breast  No rash, sores, jaundice, itching, dryness Cardiovascular  No chest pain, shortness of breath, or edema  Pulmonary  No cough or wheeze.  Gastro Intestinal  No nausea, vomitting, or diarrhoea. No bright red blood per rectum, no abdominal pain, change in bowel movement, or constipation.  Genito Urinary  No frequency, urgency, dysuria, + postmenopausal bleeding Musculo Skeletal  No myalgia, arthralgia, joint swelling or pain  Neurologic  No weakness, numbness, change in gait,  Psychology  No depression, anxiety, insomnia.   Vitals:  Last menstrual period 07/21/1998.  Physical Exam: WD in NAD Neck  Supple NROM, without any enlargements.  Lymph Node Survey No cervical supraclavicular or inguinal adenopathy Cardiovascular  Pulse normal rate, regularity and rhythm. S1 and S2 normal.  Lungs  Clear to auscultation bilateraly, without wheezes/crackles/rhonchi. Good air movement.  Skin  No rash/lesions/breakdown  Psychiatry  Alert and oriented to person, place, and time  Abdomen  Normoactive bowel sounds, abdomen soft, non-tender and overweight without evidence of hernia.  Back No CVA tenderness Genito Urinary  Vulva/vagina: Normal external female genitalia.   No lesions. No discharge or bleeding.  Bladder/urethra:  No lesions or masses, well supported bladder  Vagina: normal  Cervix: Normal appearing, no  lesions.  Uterus:  Small, mobile, no parametrial involvement or nodularity.  Adnexa: no palpable masses. Rectal  deferred Extremities  No bilateral cyanosis, clubbing or edema.  60 minutes of total time was spent for this patient encounter, including preparation, face-to-face counseling with the patient and coordination of care, and documentation of the encounter.   Thereasa Solo, MD  08/30/2020, 11:25 AM

## 2020-08-30 NOTE — Addendum Note (Signed)
Addended by: Joylene John D on: 08/30/2020 01:29 PM   Modules accepted: Orders

## 2020-08-30 NOTE — H&P (View-Only) (Signed)
Consult Note: Gyn-Onc  Consult was requested by Dr. Quincy Simmonds for the evaluation of Barbara Golden 85 y.o. female  CC:  Chief Complaint  Patient presents with  . Endometrial cancer [facility])    Assessment/Plan:  Barbara. ELIAS BORDNER  is a 85 y.o.  year old 69 with grade 1 endometrial cancer.  A detailed discussion was held with the patient with regard to to her endometrial cancer diagnosis. We discussed the standard management options for uterine cancer which includes surgery followed possibly by adjuvant therapy depending on the results of surgery. The surgical management include a robotic assisted total hysterectomy and removal of the tubes and ovaries with sampling of lymph nodes. If a minimally invasive approach is not feasible, a laparotomy may be necessary (including for specimen delivery). The patient has been counseled about these surgical options and the risks of surgery in general including infection, bleeding, damage to surrounding structures (including bowel, bladder, ureters, nerves or vessels), and the postoperative risks of PE/ DVT, and lymphedema. After counseling and consideration of her options, she is in agreement to proceed with robotic assisted total hysterectomy with bilateral sapingo-oophorectomy and SLN biopsy.   She will be seen by anesthesia for preoperative clearance and discussion of postoperative pain management.  She was given the opportunity to ask questions, which were answered to her satisfaction, and she is agreement with the above mentioned plan of care.   We explained that robotic hysterectomy is typically an outpatient procedure with same day discharge provided that she is meeting appropriate discharge criteria from the PACU. We provided extensive counseling regarding post-operative expectations for recovery and restrictions/limitations. We provided information regarding multi-modal pain therapy and the importance of avoidance of opioids.   We explained that after  surgery we will review her definitive pathology and determine if adjuvant therapy is recommended.   HPI: Barbara Golden is a 85 year old P1 who was seen in consultation at the request of Dr Quincy Simmonds for evaluation and treatment of grade 1 endometrial cancer.   Her symptoms began in the first week of February, 2022 with postmenopausal bleeding.  She saw Dr Quincy Simmonds for a symptom visit on 08/23/20.  Work-up of symptoms included a transvaginal US and endometrial biopsy. Transvaginal US on 08/23/20 showed a uterus measurng 7.5x3x3.9cm with a 17.65mm endometrial stripe. The ovaries were normal bilaterally. Endometrial sampling with endometrial pipelle was performed on 08/23/20 and showed grade 1 endometrial cancer.   She has a recent diagnosis of type II diabetes mellitus for which she is treated with metformin and trulicity (and diet). Her most recent HbA1c was 8% on 08/15/20. Her metformin dose was increased after this. She also has hypercholesterolemia and diabetic retinopathy.  Her only prior abdominal surgery was a tubal ligation. She had one prior vaginal birth. Her mother died from colon cancer at age 59. The patient lives with her daughter.   Current Meds:  Outpatient Encounter Medications as of 08/30/2020  Medication Sig  . Continuous Blood Gluc Receiver (FREESTYLE LIBRE 14 DAY READER) DEVI 1 Units by Does not apply route 2 (two) times daily. H35.30, E11.65  . Continuous Blood Gluc Sensor (FREESTYLE LIBRE 14 DAY SENSOR) MISC Apply 1 sensor every 14 days E11.319  . Elastic Bandages & Supports (MEDICAL COMPRESSION THIGH HIGH) MISC 1 each by Does not apply route daily.  . famotidine (PEPCID) 10 MG tablet Take 10 mg by mouth 2 (two) times daily as needed for heartburn or indigestion.  . fexofenadine (ALLEGRA) 60 MG tablet Take  60 mg by mouth daily.  . gabapentin (NEURONTIN) 100 MG capsule Take 1 capsule (100 mg total) by mouth at bedtime.  . metFORMIN (GLUCOPHAGE) 1000 MG tablet Take 1 tablet (1,000  mg total) by mouth 2 (two) times daily with a meal.  . Multiple Vitamins-Minerals (ICAPS AREDS 2 PO) Take by mouth in the morning and at bedtime.  . rosuvastatin (CRESTOR) 5 MG tablet Take 1 tablet (5 mg total) by mouth daily.  . TRULICITY 0.75 MG/0.5ML SOPN INJECT 0.75 MG INTO THE SKIN ONCE A WEEK.  . [DISCONTINUED] cetirizine (ZYRTEC) 10 MG tablet Take 10 mg by mouth daily as needed for allergies.  . [DISCONTINUED] ranitidine (ZANTAC) 150 MG tablet Take 150 mg by mouth daily as needed for heartburn.   No facility-administered encounter medications on file as of 08/30/2020.    Allergy:  Allergies  Allergen Reactions  . Codeine Other (See Comments)    Causes lip burning and hyperactivity    Social Hx:   Social History   Socioeconomic History  . Marital status: Widowed    Spouse name: Not on file  . Number of children: 1  . Years of education: Not on file  . Highest education level: Not on file  Occupational History  . Not on file  Tobacco Use  . Smoking status: Former Smoker    Packs/day: 0.50    Types: Cigarettes    Quit date: 03/26/2009    Years since quitting: 11.4  . Smokeless tobacco: Never Used  Vaping Use  . Vaping Use: Never used  Substance and Sexual Activity  . Alcohol use: No  . Drug use: No  . Sexual activity: Not Currently  Other Topics Concern  . Not on file  Social History Narrative   3 Step Daughters/lives with Daughter(Pam Bouwer) and son-in-law.   Social Determinants of Health   Financial Resource Strain: Not on file  Food Insecurity: Not on file  Transportation Needs: Not on file  Physical Activity: Not on file  Stress: Not on file  Social Connections: Not on file  Intimate Partner Violence: Not on file    Past Surgical Hx:  Past Surgical History:  Procedure Laterality Date  . TUBAL LIGATION Bilateral     Past Medical Hx:  Past Medical History:  Diagnosis Date  . Allergy   . Cataract   . Diabetes mellitus without complication ([facility])    . GERD (gastroesophageal reflux disease)   . Jaundice    as a child  . Macular degeneration   . Varicose veins of lower extremity    right leg    Past Gynecological History:  See HPI, she has had 1 prior vaginal delivery Patient's last menstrual period was 07/21/1998 (approximate).  Family Hx:  Family History  Problem Relation Age of Onset  . Cancer Mother        colon/stomach  . Heart disease Father   . Cancer Nephew        bile duct    Review of Systems:  Constitutional  Feels well,    ENT Normal appearing ears and nares bilaterally Skin/Breast  No rash, sores, jaundice, itching, dryness Cardiovascular  No chest pain, shortness of breath, or edema  Pulmonary  No cough or wheeze.  Gastro Intestinal  No nausea, vomitting, or diarrhoea. No bright red blood per rectum, no abdominal pain, change in bowel movement, or constipation.  Genito Urinary  No frequency, urgency, dysuria, + postmenopausal bleeding Musculo Skeletal  No myalgia, arthralgia, joint swelling or pain    Neurologic  No weakness, numbness, change in gait,  Psychology  No depression, anxiety, insomnia.   Vitals:  Last menstrual period 07/21/1998.  Physical Exam: WD in NAD Neck  Supple NROM, without any enlargements.  Lymph Node Survey No cervical supraclavicular or inguinal adenopathy Cardiovascular  Pulse normal rate, regularity and rhythm. S1 and S2 normal.  Lungs  Clear to auscultation bilateraly, without wheezes/crackles/rhonchi. Good air movement.  Skin  No rash/lesions/breakdown  Psychiatry  Alert and oriented to person, place, and time  Abdomen  Normoactive bowel sounds, abdomen soft, non-tender and overweight without evidence of hernia.  Back No CVA tenderness Genito Urinary  Vulva/vagina: Normal external female genitalia.   No lesions. No discharge or bleeding.  Bladder/urethra:  No lesions or masses, well supported bladder  Vagina: normal  Cervix: Normal appearing, no  lesions.  Uterus:  Small, mobile, no parametrial involvement or nodularity.  Adnexa: no palpable masses. Rectal  deferred Extremities  No bilateral cyanosis, clubbing or edema.  60 minutes of total time was spent for this patient encounter, including preparation, face-to-face counseling with the patient and coordination of care, and documentation of the encounter.   Thereasa Solo, MD  08/30/2020, 11:25 AM

## 2020-09-05 ENCOUNTER — Other Ambulatory Visit: Payer: Self-pay

## 2020-09-05 ENCOUNTER — Ambulatory Visit (INDEPENDENT_AMBULATORY_CARE_PROVIDER_SITE_OTHER): Payer: Medicare HMO | Admitting: Ophthalmology

## 2020-09-05 ENCOUNTER — Encounter (INDEPENDENT_AMBULATORY_CARE_PROVIDER_SITE_OTHER): Payer: Self-pay | Admitting: Ophthalmology

## 2020-09-05 DIAGNOSIS — H2513 Age-related nuclear cataract, bilateral: Secondary | ICD-10-CM | POA: Diagnosis not present

## 2020-09-05 DIAGNOSIS — H353114 Nonexudative age-related macular degeneration, right eye, advanced atrophic with subfoveal involvement: Secondary | ICD-10-CM | POA: Diagnosis not present

## 2020-09-05 DIAGNOSIS — E119 Type 2 diabetes mellitus without complications: Secondary | ICD-10-CM | POA: Diagnosis not present

## 2020-09-05 DIAGNOSIS — H353123 Nonexudative age-related macular degeneration, left eye, advanced atrophic without subfoveal involvement: Secondary | ICD-10-CM | POA: Diagnosis not present

## 2020-09-05 DIAGNOSIS — H43813 Vitreous degeneration, bilateral: Secondary | ICD-10-CM | POA: Diagnosis not present

## 2020-09-05 HISTORY — DX: Age-related nuclear cataract, bilateral: H25.13

## 2020-09-05 NOTE — Progress Notes (Signed)
09/05/2020     CHIEF COMPLAINT Patient presents for Retina Evaluation (NP AMD OU - Ref'd by Dr. Janina Mayo c/o blur OU, OD>OS x 2 months. Pt denies ocular pain, flashes, or floaters OU. Pt sts cataracts are worse OU.)   HISTORY OF PRESENT ILLNESS: Barbara Golden is a 85 y.o. female who presents to the clinic today for:   HPI    Retina Evaluation    In both eyes.  This started 2 months ago.  Duration of 2 months.  Context:  distance vision, mid-range vision and near vision. Additional comments: NP AMD OU - Ref'd by Dr. Gershon Crane  Pt c/o blur OU, OD>OS x 2 months. Pt denies ocular pain, flashes, or floaters OU. Pt sts cataracts are worse OU.       Last edited by Rockie Neighbours, [place] on 09/05/2020 10:43 AM. (History)      Referring physician: Gayland Curry, DO [place],  [place] [address]  HISTORICAL INFORMATION:   Selected notes from the MEDICAL RECORD NUMBER    Lab Results  Component Value Date   HGBA1C 8.0 (H) 08/15/2020     CURRENT MEDICATIONS: No current outpatient medications on file. (Ophthalmic Drugs)   No current facility-administered medications for this visit. (Ophthalmic Drugs)   Current Outpatient Medications (Other)  Medication Sig  . Continuous Blood Gluc Receiver (FREESTYLE LIBRE 14 DAY READER) DEVI 1 Units by Does not apply route 2 (two) times daily. H35.30, E11.65  . Continuous Blood Gluc Sensor (FREESTYLE LIBRE 14 DAY SENSOR) MISC Apply 1 sensor every 14 days E11.319  . Elastic Bandages & Supports (MEDICAL COMPRESSION THIGH HIGH) MISC 1 each by Does not apply route daily.  . famotidine (PEPCID) 10 MG tablet Take 10 mg by mouth 2 (two) times daily as needed for heartburn or indigestion.  . fexofenadine (ALLEGRA) 60 MG tablet Take 60 mg by mouth daily.  Marland Kitchen gabapentin (NEURONTIN) 100 MG capsule Take 1 capsule (100 mg total) by mouth at bedtime.  . metFORMIN (GLUCOPHAGE) 1000 MG tablet Take 1 tablet (1,000 mg total) by mouth 2 (two) times daily  with a meal.  . Multiple Vitamins-Minerals (ICAPS AREDS 2 PO) Take 1 capsule by mouth in the morning and at bedtime.  . rosuvastatin (CRESTOR) 5 MG tablet Take 1 tablet (5 mg total) by mouth daily.  Marland Kitchen senna-docusate (SENOKOT-S) 8.6-50 MG tablet Take 2 tablets by mouth at bedtime. For AFTER surgery, do not take if having diarrhea  . traMADol (ULTRAM) 50 MG tablet Take 1 tablet (50 mg total) by mouth every 6 (six) hours as needed for severe pain. For AFTER surgery, do not take and drive  . TRULICITY 2.35 TD/3.2KG SOPN INJECT 0.75 MG INTO THE SKIN ONCE A WEEK.   No current facility-administered medications for this visit. (Other)      REVIEW OF SYSTEMS:    ALLERGIES Allergies  Allergen Reactions  . Codeine Other (See Comments)    Causes lip burning and hyperactivity    PAST MEDICAL HISTORY Past Medical History:  Diagnosis Date  . Allergy   . Cataract   . Diabetes mellitus without complication ([place])   . GERD (gastroesophageal reflux disease)   . Jaundice    as a child  . Macular degeneration   . Varicose veins of lower extremity    right leg   Past Surgical History:  Procedure Laterality Date  . TUBAL LIGATION Bilateral     FAMILY HISTORY Family History  Problem Relation Age of  Onset  . Cancer Mother        colon/stomach  . Heart disease Father   . Cancer Nephew        bile duct    SOCIAL HISTORY Social History   Tobacco Use  . Smoking status: Former Smoker    Packs/day: 0.50    Types: Cigarettes    Quit date: 03/26/2009    Years since quitting: 11.4  . Smokeless tobacco: Never Used  Vaping Use  . Vaping Use: Never used  Substance Use Topics  . Alcohol use: No  . Drug use: No         OPHTHALMIC EXAM:  Base Eye Exam    Visual Acuity (ETDRS)      Right Left   Dist cc 20/200 20/60 +1   Dist ph cc NI NI   Correction: Glasses       Tonometry (Tonopen, 10:44 AM)      Right Left   Pressure 13 15       Pupils      Pupils Dark Light Shape React  APD   Right PERRL 4 3 Round Brisk None   Left PERRL 4 3 Round Brisk None       Visual Fields (Counting fingers)      Left Right    Full Full       Extraocular Movement      Right Left    Full Full       Neuro/Psych    Oriented x3: Yes   Mood/Affect: Normal       Dilation    Both eyes: 1.0% Mydriacyl, 2.5% Phenylephrine @ 10:49 AM        Slit Lamp and Fundus Exam    External Exam      Right Left   External Normal Normal       Slit Lamp Exam      Right Left   Lids/Lashes Normal Normal   Conjunctiva/Sclera White and quiet White and quiet   Cornea Clear Clear   Anterior Chamber Deep and quiet Deep and quiet   Iris Round and reactive Round and reactive   Lens 2+ Nuclear sclerosis 2+ Nuclear sclerosis   Anterior Vitreous Normal Normal       Fundus Exam      Right Left   Posterior Vitreous Posterior vitreous detachment Posterior vitreous detachment   Disc thin rim INF thin rim INF, Peripapillary atrophy   C/D Ratio 0.5 0.6   Macula Geographic atrophy, pigment in the FAZ, no macular thickening, no exudates, no hemorrhage Geographic atrophy, pigment in the FAZ, no macular thickening, no exudates, no hemorrhage   Vessels Normal, no DR Normal, no DR   Periphery Reticular degeneration Reticular degeneration          IMAGING AND PROCEDURES  Imaging and Procedures for 09/05/20  OCT, Retina - OU - Both Eyes       Right Eye Quality was good. Scan locations included subfoveal. Central Foveal Thickness: 223. Progression has no prior data. Findings include abnormal foveal contour.   Left Eye Quality was good. Scan locations included subfoveal. Central Foveal Thickness: 233. Progression has no prior data. Findings include abnormal foveal contour, outer retinal atrophy.   Notes Outer retinal atrophy OU, no CNVM OU.  With significant loss of the photoreceptor layer and ellipsoid zone in the macular region right eye subfoveal and left eye perifoveal  Incidental  punctual posterior vitreous detachment low-lying left eye, and PVD right eye  ASSESSMENT/PLAN:  Advanced nonexudative age-related macular degeneration of right eye with subfoveal involvement The nature of dry age related macular degeneration was discussed with the patient as well as its possible conversion to wet. The results of the AREDS 2 study was discussed with the patient. A diet rich in dark leafy green vegetables was advised and specific recommendations were made regarding supplements with AREDS 2 formulation . Control of hypertension and serum cholesterol may slow the disease. Smoking cessation is mandatory to slow the disease and diminish the risk of progressing to wet age related macular degeneration. The patient was instructed in the use of an [place] and was told to return immediately for any changes in the Grid. Stressed to the patient do not rub eyes  The nature of age realated macular degeneration (ARMD)is explained as follows: The dry form refers to the progressive loss of normal blood supply to the central vision as a result of a combination of factors which include aging blood supply (arteriosclerosis, hardening of the arteries), genetics, smoking habits, and history of hypertension. Currently, no eye medications or vitamins slow this type of aging effect upon vision, however cessation of smoking and controlling hypertension help slow the disorder. The following analogy helps explain this: I describe the dry form of ARMD like a house of the same age as your eyes, which shows typical wear and tear of age upon the house structure and appearance. Like the aging house which can fall down structurally, the dry form of ARMD can deteriorate the structure of the macula (center) of the retina, most often gradually and affect central vision tasks such as reading and driving. The wet form of ARMD refers to the development of abnormally growing blood vessels usually near or  under the central vision, with potential risk of permanent visual changes or vision losses. This complication of the Dry form of ARMD may be moderately reduced by use of AREDS 2 formula multivitamins daily. I describe the Wet form of ARMD as like the development of a fire in an aging house (DRY ARMD). It may develop suddenly, progress rapidly and be destructive based on where it starts and how big the fire is when found. In the eye, the house fire analogy refers to the abnormal blood vessels growing destructively near the central vison in the retina, or film of the eye. Halting the growth of blood vessels with laser (hot or cold) or injectable medications is best way "to put the fire out". Many patients will experience a stabilization or even improvement in vision with a treatment course, while others may still face a loss of vision. The use of injectable medications has revolutionized therapy and is currently the only proven therapy to provide the chance of stable or improved acuity from new and recent destructive wet ARMD.  Advanced nonexudative age-related macular degeneration of left eye without subfoveal involvement The nature of dry age related macular degeneration was discussed with the patient as well as its possible conversion to wet. The results of the AREDS 2 study was discussed with the patient. A diet rich in dark leafy green vegetables was advised and specific recommendations were made regarding supplements with AREDS 2 formulation . Control of hypertension and serum cholesterol may slow the disease. Smoking cessation is mandatory to slow the disease and diminish the risk of progressing to wet age related macular degeneration. The patient was instructed in the use of an [place] and was told to return immediately for any changes in the Grid.  Stressed to the patient do not rub eyes  Nuclear sclerotic cataract of both eyes Cataract(s) account for the a portion of the patient's complaint. I  discussed the risks and benefits of cataract surgery. Options were explained to the patient. The patient understands that new glasses may not improve their vision and desires to have cataract surgery. I have recommended follow up with their general eye care doctor for evaluation and consideration of cataract extraction with new intraocular lens insertion.  I have explained to the patient that I do recommend consideration for cataract traction with intraocular lens placement initially in the right eye.  Pseudophakia of each eye will maximize the visual potential.  This will also allow for potential use of magnifiers in the future.  Performance of surgery in the right eye will allow her in the surgeon to determine the natural c course of healing process of cataract surgery and its recovery.  To allow preparation for her best acuity possibilities in the left eye  Diabetes mellitus without complication ([place]) The patient has diabetes without any evidence of retinopathy. The patient advised to maintain good blood glucose control, excellent blood pressure control, and favorable levels of cholesterol, low density lipoprotein, and high density lipoproteins. Follow up in 1 year was recommended. Explained that fluctuations in visual acuity , or "out of focus", may result from large variations of blood sugar control.      ICD-10-CM   1. Advanced nonexudative age-related macular degeneration of right eye with subfoveal involvement  H35.3114 OCT, Retina - OU - Both Eyes  2. Advanced nonexudative age-related macular degeneration of left eye without subfoveal involvement  H35.3123 OCT, Retina - OU - Both Eyes  3. Posterior vitreous detachment of both eyes  H43.813 OCT, Retina - OU - Both Eyes  4. Nuclear sclerotic cataract of both eyes  H25.13   5. Diabetes mellitus without complication ([place])  N56.2     1.  Dry age-related macular degeneration of each eye.  I explained to the patient the old house crumbling  analogy.  See the discussions above and dry macular degeneration for the explanation.  2.  I do recommend proceeding with cataract surgery and each eye, I suggest the right eye first  3.  No complications of diabetic eye disease seen or detected in examination OU  Ophthalmic Meds Ordered this visit:  No orders of the defined types were placed in this encounter.      Return in about 6 months (around 03/05/2021) for DILATE OU, OCT.  There are no Patient Instructions on file for this visit.   Explained the diagnoses, plan, and follow up with the patient and they expressed understanding.  Patient expressed understanding of the importance of proper follow up care.   Clent Demark Kazi Montoro M.D. Diseases & Surgery of the Retina and Vitreous Retina & Diabetic [place] 09/05/20     Abbreviations: M myopia (nearsighted); A astigmatism; H hyperopia (farsighted); P presbyopia; Mrx spectacle prescription;  CTL contact lenses; OD right eye; OS left eye; OU both eyes  XT exotropia; ET esotropia; PEK punctate epithelial keratitis; PEE punctate epithelial erosions; DES dry eye syndrome; MGD meibomian gland dysfunction; ATs artificial tears; PFAT's preservative free artificial tears; [place] nuclear sclerotic cataract; PSC posterior subcapsular cataract; ERM epi-retinal membrane; PVD posterior vitreous detachment; RD retinal detachment; DM diabetes mellitus; DR diabetic retinopathy; NPDR non-proliferative diabetic retinopathy; PDR proliferative diabetic retinopathy; CSME clinically significant macular edema; DME diabetic macular edema; dbh dot blot hemorrhages; CWS cotton wool spot; POAG primary  open angle glaucoma; C/D cup-to-disc ratio; HVF humphrey visual field; GVF goldmann visual field; OCT optical coherence tomography; IOP intraocular pressure; BRVO Branch retinal vein occlusion; CRVO central retinal vein occlusion; CRAO central retinal artery occlusion; BRAO branch retinal artery occlusion; RT retinal tear; SB  scleral buckle; PPV pars plana vitrectomy; VH Vitreous hemorrhage; PRP panretinal laser photocoagulation; IVK intravitreal kenalog; VMT vitreomacular traction; MH Macular hole;  NVD neovascularization of the disc; NVE neovascularization elsewhere; AREDS age related eye disease study; ARMD age related macular degeneration; POAG primary open angle glaucoma; EBMD epithelial/anterior basement membrane dystrophy; ACIOL anterior chamber intraocular lens; IOL intraocular lens; PCIOL posterior chamber intraocular lens; Phaco/IOL phacoemulsification with intraocular lens placement; [place] photorefractive keratectomy; LASIK laser assisted in situ keratomileusis; HTN hypertension; DM diabetes mellitus; COPD chronic obstructive pulmonary disease

## 2020-09-05 NOTE — Assessment & Plan Note (Signed)

## 2020-09-05 NOTE — Assessment & Plan Note (Signed)

## 2020-09-05 NOTE — Patient Instructions (Addendum)
DUE TO COVID-19 ONLY ONE VISITOR IS ALLOWED TO COME WITH YOU AND STAY IN THE WAITING ROOM ONLY DURING PRE OP AND PROCEDURE DAY OF SURGERY. THE 1 VISITOR  MAY VISIT WITH YOU AFTER SURGERY IN YOUR PRIVATE ROOM DURING VISITING HOURS ONLY!  YOU NEED TO HAVE A COVID 19 TEST ON__2-18-22_____ @_______ , THIS TEST MUST BE DONE BEFORE SURGERY,  COVID TESTING SITE [address] [place] [address], IT IS ON THE RIGHT GOING OUT [address] APPROXIMATELY  2 MINUTES PAST [employer] ON THE RIGHT. ONCE YOUR COVID TEST IS COMPLETED,  PLEASE BEGIN THE QUARANTINE INSTRUCTIONS AS OUTLINED IN YOUR HANDOUT.                Barbara Golden  09/05/2020   Your procedure is scheduled on: 2-22   [facility] Main  Entrance   Report to admitting at        0530 AM     Call this number if you have problems the morning of surgery [phone]    Remember: Eat a light diet the day before surgery.  Examples including soups, broths, toast, yogurt, mashed potatoes.  Things to avoid include carbonated beverages (fizzy beverages), raw fruits and raw vegetables, or beans.   If your bowels are filled with gas, your surgeon will have difficulty visualizing your pelvic organs which increases your surgical risks.  Do not eat food :After Midnight. You may have clear liquids until 0430 am then nothing by mouth    CLEAR LIQUID DIET   Foods Allowed                                                                  Coffee and tea, regular and decaf                          Plain Jell-O any favor except red or purple                                            Fruit ices (not with fruit pulp)                                    Iced Popsicles                                                                    Cranberry, grape and apple juices Sports drinks like Gatorade Lightly seasoned clear broth or consume(fat free) sweetener , honey syrup  BRUSH YOUR TEETH MORNING OF SURGERY AND RINSE  YOUR MOUTH OUT, NO CHEWING GUM CANDY OR MINTS.     Take these medicines the morning of surgery with A SIP OF WATER: crestor, allegra  DO NOT TAKE ANY DIABETIC MEDICATIONS DAY OF YOUR SURGERY  You may not have any metal on your body including hair pins and              piercings  Do not wear jewelry, make-up, lotions, powders or perfumes, deodorant             Do not wear nail polish on your fingernails.  Do not shave  48 hours prior to surgery.     Do not bring valuables to the hospital. [place].  Contacts, dentures or bridgework may not be worn into surgery.      Patients discharged the day of surgery will not be allowed to drive home. IF YOU ARE HAVING SURGERY AND GOING HOME THE SAME DAY, YOU MUST HAVE AN ADULT TO DRIVE YOU HOME AND BE WITH YOU FOR 24 HOURS. YOU MAY GO HOME BY TAXI OR UBER OR ORTHERWISE, BUT AN ADULT MUST ACCOMPANY YOU HOME AND STAY WITH YOU FOR 24 HOURS.  Name and phone number of your driver:  Special Instructions: N/A              Please read over the following fact sheets you were given: _____________________________________________________________________             [facility] - Preparing for Surgery Before surgery, you can play an important role.  Because skin is not sterile, your skin needs to be as free of germs as possible.  You can reduce the number of germs on your skin by washing with CHG (chlorahexidine gluconate) soap before surgery.  CHG is an antiseptic cleaner which kills germs and bonds with the skin to continue killing germs even after washing. Please DO NOT use if you have an allergy to CHG or antibacterial soaps.  If your skin becomes reddened/irritated stop using the CHG and inform your nurse when you arrive at Short Stay. Do not shave (including legs and underarms) for at least 48 hours prior to the first CHG shower.  You may shave your face/neck. Please follow  these instructions carefully:  1.  Shower with CHG Soap the night before surgery and the  morning of Surgery.  2.  If you choose to wash your hair, wash your hair first as usual with your  normal  shampoo.  3.  After you shampoo, rinse your hair and body thoroughly to remove the  shampoo.                           4.  Use CHG as you would any other liquid soap.  You can apply chg directly  to the skin and wash                       Gently with a scrungie or clean washcloth.  5.  Apply the CHG Soap to your body ONLY FROM THE NECK DOWN.   Do not use on face/ open                           Wound or open sores. Avoid contact with eyes, ears mouth and genitals (private parts).                       Wash face,  Genitals (private parts) with your normal soap.  6.  Wash thoroughly, paying special attention to the area where your surgery  will be performed.  7.  Thoroughly rinse your body with warm water from the neck down.  8.  DO NOT shower/wash with your normal soap after using and rinsing off  the CHG Soap.                9.  Pat yourself dry with a clean towel.            10.  Wear clean pajamas.            11.  Place clean sheets on your bed the night of your first shower and do not  sleep with pets. Day of Surgery : Do not apply any lotions/deodorants the morning of surgery.  Please wear clean clothes to the hospital/surgery center.  FAILURE TO FOLLOW THESE INSTRUCTIONS MAY RESULT IN THE CANCELLATION OF YOUR SURGERY PATIENT SIGNATURE_________________________________  NURSE SIGNATURE__________________________________  ________________________________________________________________________

## 2020-09-05 NOTE — Assessment & Plan Note (Addendum)
The nature of dry age related macular degeneration was discussed with the patient as well as its possible conversion to wet. The results of the AREDS 2 study was discussed with the patient. A diet rich in dark leafy green vegetables was advised and specific recommendations were made regarding supplements with AREDS 2 formulation . Control of hypertension and serum cholesterol may slow the disease. Smoking cessation is mandatory to slow the disease and diminish the risk of progressing to wet age related macular degeneration. The patient was instructed in the use of an [place] Grid and was told to return immediately for any changes in the Grid. Stressed to the patient do not rub eyes  The nature of age realated macular degeneration (ARMD)is explained as follows: The dry form refers to the progressive loss of normal blood supply to the central vision as a result of a combination of factors which include aging blood supply (arteriosclerosis, hardening of the arteries), genetics, smoking habits, and history of hypertension. Currently, no eye medications or vitamins slow this type of aging effect upon vision, however cessation of smoking and controlling hypertension help slow the disorder. The following analogy helps explain this: I describe the dry form of ARMD like a house of the same age as your eyes, which shows typical wear and tear of age upon the house structure and appearance. Like the aging house which can fall down structurally, the dry form of ARMD can deteriorate the structure of the macula (center) of the retina, most often gradually and affect central vision tasks such as reading and driving. The wet form of ARMD refers to the development of abnormally growing blood vessels usually near or under the central vision, with potential risk of permanent visual changes or vision losses. This complication of the Dry form of ARMD may be moderately reduced by use of AREDS 2 formula multivitamins daily. I describe the  Wet form of ARMD as like the development of a fire in an aging house (DRY ARMD). It may develop suddenly, progress rapidly and be destructive based on where it starts and how big the fire is when found. In the eye, the house fire analogy refers to the abnormal blood vessels growing destructively near the central vison in the retina, or film of the eye. Halting the growth of blood vessels with laser (hot or cold) or injectable medications is best way "to put the fire out". Many patients will experience a stabilization or even improvement in vision with a treatment course, while others may still face a loss of vision. The use of injectable medications has revolutionized therapy and is currently the only proven therapy to provide the chance of stable or improved acuity from new and recent destructive wet ARMD. 

## 2020-09-05 NOTE — Assessment & Plan Note (Addendum)
Cataract(s) account for the a portion of the patient's complaint. I discussed the risks and benefits of cataract surgery. Options were explained to the patient. The patient understands that new glasses may not improve their vision and desires to have cataract surgery. I have recommended follow up with their general eye care doctor for evaluation and consideration of cataract extraction with new intraocular lens insertion.  I have explained to the patient that I do recommend consideration for cataract traction with intraocular lens placement initially in the right eye.  Pseudophakia of each eye will maximize the visual potential.  This will also allow for potential use of magnifiers in the future.  Performance of surgery in the right eye will allow her in the surgeon to determine the natural c course of healing process of cataract surgery and its recovery.  To allow preparation for her best acuity possibilities in the left eye

## 2020-09-05 NOTE — Progress Notes (Addendum)
PCP - Hollace Kinnier , DO Cardiologist - no  PPM/ICD -  Device Orders -  Rep Notified -   Chest x-ray -  EKG - 09-12-19 Stress Test -  ECHO -  Cardiac Cath -   Sleep Study -  CPAP -   Fasting Blood Sugar -  Checks Blood Sugar _____ times a day  Blood Thinner Instructions: Aspirin Instructions:  ERAS Protcol - PRE-SURGERY   COVID TEST- 09-07-20  Activity-Able to walk a flight of stairs without sob limited to vein surgery  Anesthesia review: DM 2, Hgba1c 08-15-20 8.0  Patient denies shortness of breath, fever, cough and chest pain at PAT appointment  none   All instructions explained to the patient, with a verbal understanding of the material. Patient agrees to go over the instructions while at home for a better understanding. Patient also instructed to self quarantine after being tested for COVID-19. The opportunity to ask questions was provided.

## 2020-09-06 ENCOUNTER — Other Ambulatory Visit: Payer: Self-pay

## 2020-09-06 ENCOUNTER — Encounter (HOSPITAL_COMMUNITY): Payer: Self-pay

## 2020-09-06 ENCOUNTER — Encounter (HOSPITAL_COMMUNITY)
Admission: RE | Admit: 2020-09-06 | Discharge: 2020-09-06 | Disposition: A | Discharge status: Home / Self Care | Payer: Medicare HMO | Source: Ambulatory Visit | Attending: Gynecologic Oncology | Admitting: Gynecologic Oncology

## 2020-09-06 DIAGNOSIS — Z01812 Encounter for preprocedural laboratory examination: Secondary | ICD-10-CM | POA: Insufficient documentation

## 2020-09-06 HISTORY — DX: Malignant (primary) neoplasm, unspecified: C80.1

## 2020-09-06 HISTORY — DX: Pure hypercholesterolemia, unspecified: E78.00

## 2020-09-06 LAB — URINALYSIS, ROUTINE W REFLEX MICROSCOPIC
Bilirubin Urine: NEGATIVE
Glucose, UA: 50 mg/dL — AB
Hgb urine dipstick: NEGATIVE
Ketones, ur: 5 mg/dL — AB
Nitrite: NEGATIVE
Protein, ur: NEGATIVE mg/dL
Specific Gravity, Urine: 1.023 (ref 1.005–1.030)
pH: 5 (ref 5.0–8.0)

## 2020-09-06 LAB — COMPREHENSIVE METABOLIC PANEL
ALT: 11 U/L (ref 0–44)
AST: 13 U/L — ABNORMAL LOW (ref 15–41)
Albumin: 4.2 g/dL (ref 3.5–5.0)
Alkaline Phosphatase: 38 U/L (ref 38–126)
Anion gap: 10 (ref 5–15)
BUN: 16 mg/dL (ref 8–23)
CO2: 29 mmol/L (ref 22–32)
Calcium: 9.8 mg/dL (ref 8.9–10.3)
Chloride: 100 mmol/L (ref 98–111)
Creatinine, Ser: 0.63 mg/dL (ref 0.44–1.00)
GFR, Estimated: >60 mL/min (ref >60)
Glucose, Bld: 117 mg/dL — ABNORMAL HIGH (ref 70–99)
Potassium: 4.8 mmol/L (ref 3.5–5.1)
Sodium: 139 mmol/L (ref 135–145)
Total Bilirubin: 0.8 mg/dL (ref 0.3–1.2)
Total Protein: 7.7 g/dL (ref 6.5–8.1)

## 2020-09-06 LAB — GLUCOSE, CAPILLARY: Glucose-Capillary: 122 mg/dL — ABNORMAL HIGH (ref 70–99)

## 2020-09-06 LAB — CBC
HCT: 44.3 % (ref 36.0–46.0)
Hemoglobin: 14.6 g/dL (ref 12.0–15.0)
MCH: 30.5 pg (ref 26.0–34.0)
MCHC: 33 g/dL (ref 30.0–36.0)
MCV: 92.5 fL (ref 80.0–100.0)
Platelets: 342 10*3/uL (ref 150–400)
RBC: 4.79 MIL/uL (ref 3.87–5.11)
RDW: 12.6 % (ref 11.5–15.5)
WBC: 11.9 10*3/uL — ABNORMAL HIGH (ref 4.0–10.5)
nRBC: 0 % (ref 0.0–0.2)

## 2020-09-07 ENCOUNTER — Other Ambulatory Visit (HOSPITAL_COMMUNITY): Payer: Medicare HMO

## 2020-09-07 ENCOUNTER — Other Ambulatory Visit (HOSPITAL_COMMUNITY)
Admission: RE | Admit: 2020-09-07 | Discharge: 2020-09-07 | Disposition: A | Discharge status: Home / Self Care | Payer: Medicare HMO | Source: Ambulatory Visit | Attending: Gynecologic Oncology | Admitting: Gynecologic Oncology

## 2020-09-07 DIAGNOSIS — Z20822 Contact with and (suspected) exposure to covid-19: Secondary | ICD-10-CM | POA: Diagnosis not present

## 2020-09-07 DIAGNOSIS — Z01812 Encounter for preprocedural laboratory examination: Secondary | ICD-10-CM | POA: Insufficient documentation

## 2020-09-07 LAB — SARS CORONAVIRUS 2 (TAT 6-24 HRS): SARS Coronavirus 2: NEGATIVE

## 2020-09-08 LAB — URINE CULTURE: Culture: <10000 — AB

## 2020-09-10 ENCOUNTER — Telehealth: Payer: Self-pay

## 2020-09-10 ENCOUNTER — Encounter: Payer: Self-pay | Admitting: Internal Medicine

## 2020-09-10 NOTE — Telephone Encounter (Signed)
Barbara Golden states that she understands her written pre-op instructions from 09-06-20.  She has no questions or concerns at this time.

## 2020-09-11 ENCOUNTER — Ambulatory Visit (HOSPITAL_COMMUNITY): Payer: Medicare HMO | Admitting: Certified Registered Nurse Anesthetist

## 2020-09-11 ENCOUNTER — Encounter: Payer: Self-pay | Admitting: Gynecologic Oncology

## 2020-09-11 ENCOUNTER — Ambulatory Visit (HOSPITAL_COMMUNITY)
Admission: RE | Admit: 2020-09-11 | Discharge: 2020-09-11 | Disposition: A | Discharge status: Home / Self Care | Payer: Medicare HMO | Attending: Gynecologic Oncology | Admitting: Gynecologic Oncology

## 2020-09-11 ENCOUNTER — Encounter (HOSPITAL_COMMUNITY): Payer: Self-pay | Admitting: Gynecologic Oncology

## 2020-09-11 ENCOUNTER — Encounter (HOSPITAL_COMMUNITY)
Admission: RE | Disposition: A | Discharge status: Home / Self Care | Payer: Self-pay | Source: Home / Self Care | Attending: Gynecologic Oncology

## 2020-09-11 DIAGNOSIS — Z9851 Tubal ligation status: Secondary | ICD-10-CM | POA: Insufficient documentation

## 2020-09-11 DIAGNOSIS — Z79899 Other long term (current) drug therapy: Secondary | ICD-10-CM | POA: Insufficient documentation

## 2020-09-11 DIAGNOSIS — K219 Gastro-esophageal reflux disease without esophagitis: Secondary | ICD-10-CM | POA: Diagnosis not present

## 2020-09-11 DIAGNOSIS — Z87891 Personal history of nicotine dependence: Secondary | ICD-10-CM | POA: Insufficient documentation

## 2020-09-11 DIAGNOSIS — E11319 Type 2 diabetes mellitus with unspecified diabetic retinopathy without macular edema: Secondary | ICD-10-CM | POA: Diagnosis not present

## 2020-09-11 DIAGNOSIS — E7800 Pure hypercholesterolemia, unspecified: Secondary | ICD-10-CM | POA: Diagnosis not present

## 2020-09-11 DIAGNOSIS — N800 Endometriosis of uterus: Secondary | ICD-10-CM | POA: Insufficient documentation

## 2020-09-11 DIAGNOSIS — C541 Malignant neoplasm of endometrium: Secondary | ICD-10-CM | POA: Diagnosis not present

## 2020-09-11 DIAGNOSIS — I10 Essential (primary) hypertension: Secondary | ICD-10-CM | POA: Diagnosis not present

## 2020-09-11 DIAGNOSIS — Z885 Allergy status to narcotic agent status: Secondary | ICD-10-CM | POA: Insufficient documentation

## 2020-09-11 DIAGNOSIS — Z7984 Long term (current) use of oral hypoglycemic drugs: Secondary | ICD-10-CM | POA: Diagnosis not present

## 2020-09-11 HISTORY — PX: ROBOTIC ASSISTED TOTAL HYSTERECTOMY WITH BILATERAL SALPINGO OOPHERECTOMY: SHX6086

## 2020-09-11 HISTORY — PX: SENTINEL NODE BIOPSY: SHX6608

## 2020-09-11 LAB — GLUCOSE, CAPILLARY
Glucose-Capillary: 143 mg/dL — ABNORMAL HIGH (ref 70–99)
Glucose-Capillary: 146 mg/dL — ABNORMAL HIGH (ref 70–99)
Glucose-Capillary: 203 mg/dL — ABNORMAL HIGH (ref 70–99)

## 2020-09-11 LAB — ABO/RH: ABO/RH(D): A POS

## 2020-09-11 LAB — TYPE AND SCREEN
ABO/RH(D): A POS
Antibody Screen: NEGATIVE

## 2020-09-11 SURGERY — HYSTERECTOMY, TOTAL, ROBOT-ASSISTED, LAPAROSCOPIC, WITH BILATERAL SALPINGO-OOPHORECTOMY
Anesthesia: General

## 2020-09-11 MED ORDER — LIDOCAINE 2% (20 MG/ML) 5 ML SYRINGE
INTRAMUSCULAR | Status: DC | PRN
  Administered 2020-09-11: 60 mg via INTRAVENOUS

## 2020-09-11 MED ORDER — LIDOCAINE HCL (PF) 2 % IJ SOLN
INTRAMUSCULAR | Status: AC
  Filled 2020-09-11: qty 5

## 2020-09-11 MED ORDER — DEXAMETHASONE SODIUM PHOSPHATE 10 MG/ML IJ SOLN
INTRAMUSCULAR | Status: DC | PRN
  Administered 2020-09-11: 8 mg via INTRAVENOUS

## 2020-09-11 MED ORDER — ORAL CARE MOUTH RINSE: 15.0000 mL | Freq: Once | OROMUCOSAL | Status: AC

## 2020-09-11 MED ORDER — PROPOFOL 10 MG/ML IV BOLUS
INTRAVENOUS | Status: AC
  Filled 2020-09-11: qty 20

## 2020-09-11 MED ORDER — FENTANYL CITRATE (PF) 100 MCG/2ML IJ SOLN: 25.0000 ug | INTRAMUSCULAR | Status: DC | PRN

## 2020-09-11 MED ORDER — BUPIVACAINE HCL 0.25 % IJ SOLN
INTRAMUSCULAR | Status: DC | PRN
  Administered 2020-09-11: 17 mL

## 2020-09-11 MED ORDER — SODIUM CHLORIDE 0.9% FLUSH: 3.0000 mL | Freq: Two times a day (BID) | INTRAVENOUS | Status: DC

## 2020-09-11 MED ORDER — FENTANYL CITRATE (PF) 250 MCG/5ML IJ SOLN
INTRAMUSCULAR | Status: DC | PRN
  Administered 2020-09-11 (×4): 50 ug via INTRAVENOUS

## 2020-09-11 MED ORDER — STERILE WATER FOR IRRIGATION IR SOLN
Status: DC | PRN
  Administered 2020-09-11: 1000 mL

## 2020-09-11 MED ORDER — PHENYLEPHRINE HCL (PRESSORS) 10 MG/ML IV SOLN
INTRAVENOUS | Status: AC
  Filled 2020-09-11: qty 1

## 2020-09-11 MED ORDER — SUGAMMADEX SODIUM 200 MG/2ML IV SOLN
INTRAVENOUS | Status: DC | PRN
  Administered 2020-09-11: 300 mg via INTRAVENOUS

## 2020-09-11 MED ORDER — FENTANYL CITRATE (PF) 100 MCG/2ML IJ SOLN
INTRAMUSCULAR | Status: AC
  Filled 2020-09-11: qty 2

## 2020-09-11 MED ORDER — ONDANSETRON HCL 4 MG/2ML IJ SOLN
INTRAMUSCULAR | Status: AC
  Filled 2020-09-11: qty 2

## 2020-09-11 MED ORDER — PROPOFOL 10 MG/ML IV BOLUS
INTRAVENOUS | Status: DC | PRN
  Administered 2020-09-11: 80 mg via INTRAVENOUS

## 2020-09-11 MED ORDER — SODIUM CHLORIDE 0.9 % IV SOLN: 250.0000 mL | INTRAVENOUS | Status: DC | PRN

## 2020-09-11 MED ORDER — ROCURONIUM BROMIDE 10 MG/ML (PF) SYRINGE
PREFILLED_SYRINGE | INTRAVENOUS | Status: DC | PRN
  Administered 2020-09-11: 50 mg via INTRAVENOUS
  Administered 2020-09-11: 20 mg via INTRAVENOUS
  Administered 2020-09-11: 50 mg via INTRAVENOUS

## 2020-09-11 MED ORDER — BUPIVACAINE HCL 0.25 % IJ SOLN
INTRAMUSCULAR | Status: AC
  Filled 2020-09-11: qty 1

## 2020-09-11 MED ORDER — PHENYLEPHRINE 40 MCG/ML (10ML) SYRINGE FOR IV PUSH (FOR BLOOD PRESSURE SUPPORT)
PREFILLED_SYRINGE | INTRAVENOUS | Status: AC
  Filled 2020-09-11: qty 10

## 2020-09-11 MED ORDER — CEFAZOLIN SODIUM-DEXTROSE 2-4 GM/100ML-% IV SOLN
2.0000 g | INTRAVENOUS | Status: AC
  Administered 2020-09-11: 2 g via INTRAVENOUS
  Filled 2020-09-11: qty 100

## 2020-09-11 MED ORDER — KETAMINE HCL 10 MG/ML IJ SOLN
INTRAMUSCULAR | Status: DC | PRN
  Administered 2020-09-11: 30 mg via INTRAVENOUS

## 2020-09-11 MED ORDER — ENOXAPARIN SODIUM 40 MG/0.4ML ~~LOC~~ SOLN
40.0000 mg | SUBCUTANEOUS | Status: AC
  Administered 2020-09-11: 40 mg via SUBCUTANEOUS
  Filled 2020-09-11: qty 0.4

## 2020-09-11 MED ORDER — ACETAMINOPHEN 325 MG PO TABS: 650.0000 mg | ORAL_TABLET | ORAL | Status: DC | PRN

## 2020-09-11 MED ORDER — EPHEDRINE 5 MG/ML INJ
INTRAVENOUS | Status: AC
  Filled 2020-09-11: qty 10

## 2020-09-11 MED ORDER — SODIUM CHLORIDE 0.9% FLUSH: 3.0000 mL | INTRAVENOUS | Status: DC | PRN

## 2020-09-11 MED ORDER — GABAPENTIN 100 MG PO CAPS
100.0000 mg | ORAL_CAPSULE | ORAL | Status: AC
  Administered 2020-09-11: 100 mg via ORAL
  Filled 2020-09-11: qty 1

## 2020-09-11 MED ORDER — CHLORHEXIDINE GLUCONATE 0.12 % MT SOLN
15.0000 mL | Freq: Once | OROMUCOSAL | Status: AC
  Administered 2020-09-11: 15 mL via OROMUCOSAL

## 2020-09-11 MED ORDER — ONDANSETRON HCL 4 MG/2ML IJ SOLN: 4.0000 mg | Freq: Once | INTRAMUSCULAR | Status: DC | PRN

## 2020-09-11 MED ORDER — SUCCINYLCHOLINE CHLORIDE 200 MG/10ML IV SOSY
PREFILLED_SYRINGE | INTRAVENOUS | Status: AC
  Filled 2020-09-11: qty 10

## 2020-09-11 MED ORDER — LACTATED RINGERS IR SOLN
Status: DC | PRN
  Administered 2020-09-11: 1000 mL

## 2020-09-11 MED ORDER — LABETALOL HCL 5 MG/ML IV SOLN
INTRAVENOUS | Status: DC | PRN
  Administered 2020-09-11: 5 mg via INTRAVENOUS

## 2020-09-11 MED ORDER — STERILE WATER FOR INJECTION IJ SOLN
INTRAMUSCULAR | Status: DC | PRN
  Administered 2020-09-11: 4 mL

## 2020-09-11 MED ORDER — STERILE WATER FOR INJECTION IJ SOLN
INTRAMUSCULAR | Status: AC
  Filled 2020-09-11: qty 10

## 2020-09-11 MED ORDER — STERILE WATER FOR INJECTION IJ SOLN
INTRAMUSCULAR | Status: DC | PRN
  Administered 2020-09-11: 1 mL via INTRAMUSCULAR

## 2020-09-11 MED ORDER — ACETAMINOPHEN 500 MG PO TABS
1000.0000 mg | ORAL_TABLET | ORAL | Status: AC
  Administered 2020-09-11: 1000 mg via ORAL
  Filled 2020-09-11: qty 2

## 2020-09-11 MED ORDER — DEXAMETHASONE SODIUM PHOSPHATE 10 MG/ML IJ SOLN
INTRAMUSCULAR | Status: AC
  Filled 2020-09-11: qty 1

## 2020-09-11 MED ORDER — LACTATED RINGERS IV SOLN: INTRAVENOUS | Status: DC

## 2020-09-11 MED ORDER — ACETAMINOPHEN 650 MG RE SUPP
650.0000 mg | RECTAL | Status: DC | PRN
  Filled 2020-09-11: qty 1

## 2020-09-11 MED ORDER — TRAMADOL HCL 50 MG PO TABS: 100.0000 mg | ORAL_TABLET | Freq: Two times a day (BID) | ORAL | Status: DC | PRN

## 2020-09-11 MED ORDER — ONDANSETRON HCL 4 MG/2ML IJ SOLN
INTRAMUSCULAR | Status: DC | PRN
  Administered 2020-09-11: 4 mg via INTRAVENOUS

## 2020-09-11 MED ORDER — KETAMINE HCL 10 MG/ML IJ SOLN
INTRAMUSCULAR | Status: AC
  Filled 2020-09-11: qty 1

## 2020-09-11 MED ORDER — ROCURONIUM BROMIDE 10 MG/ML (PF) SYRINGE
PREFILLED_SYRINGE | INTRAVENOUS | Status: AC
  Filled 2020-09-11: qty 10

## 2020-09-11 SURGICAL SUPPLY — 72 items
APPLICATOR SURGIFLO ENDO (HEMOSTASIS) IMPLANT
BACTOSHIELD CHG 4% 4OZ (MISCELLANEOUS) ×2
BAG LAPAROSCOPIC 12 15 PORT 16 (BASKET) IMPLANT
BAG RETRIEVAL 12/15 (BASKET)
BAG RETRIEVAL 12/15MM (BASKET)
BLADE SURG SZ10 CARB STEEL (BLADE) IMPLANT
CELLS DAT CNTRL 66122 CELL SVR (MISCELLANEOUS) IMPLANT
COVER BACK TABLE 60X90IN (DRAPES) ×4 IMPLANT
COVER TIP SHEARS 8 DVNC (MISCELLANEOUS) ×2 IMPLANT
COVER TIP SHEARS 8MM DA VINCI (MISCELLANEOUS) ×4
COVER WAND RF STERILE (DRAPES) IMPLANT
DECANTER SPIKE VIAL GLASS SM (MISCELLANEOUS) IMPLANT
DERMABOND ADVANCED (GAUZE/BANDAGES/DRESSINGS) ×2
DERMABOND ADVANCED .7 DNX12 (GAUZE/BANDAGES/DRESSINGS) ×2 IMPLANT
DRAPE ARM DVNC X/XI (DISPOSABLE) ×8 IMPLANT
DRAPE COLUMN DVNC XI (DISPOSABLE) ×2 IMPLANT
DRAPE DA VINCI XI ARM (DISPOSABLE) ×16
DRAPE DA VINCI XI COLUMN (DISPOSABLE) ×4
DRAPE SHEET LG 3/4 BI-LAMINATE (DRAPES) ×4 IMPLANT
DRAPE SURG IRRIG POUCH 19X23 (DRAPES) ×4 IMPLANT
DRSG OPSITE POSTOP 4X6 (GAUZE/BANDAGES/DRESSINGS) IMPLANT
DRSG OPSITE POSTOP 4X8 (GAUZE/BANDAGES/DRESSINGS) IMPLANT
ELECT PENCIL ROCKER SW 15FT (MISCELLANEOUS) IMPLANT
ELECT REM PT RETURN 15FT ADLT (MISCELLANEOUS) ×4 IMPLANT
GLOVE SURG ENC MOIS LTX SZ6 (GLOVE) ×16 IMPLANT
GLOVE SURG ENC MOIS LTX SZ6.5 (GLOVE) ×8 IMPLANT
GOWN STRL REUS W/ TWL LRG LVL3 (GOWN DISPOSABLE) ×8 IMPLANT
GOWN STRL REUS W/TWL LRG LVL3 (GOWN DISPOSABLE) ×16
HOLDER FOLEY CATH W/STRAP (MISCELLANEOUS) IMPLANT
IRRIG SUCT STRYKERFLOW 2 WTIP (MISCELLANEOUS) ×4
IRRIGATION SUCT STRKRFLW 2 WTP (MISCELLANEOUS) ×2 IMPLANT
KIT PROCEDURE DA VINCI SI (MISCELLANEOUS) ×4
KIT PROCEDURE DVNC SI (MISCELLANEOUS) ×2 IMPLANT
KIT TURNOVER KIT A (KITS) ×4 IMPLANT
MANIPULATOR UTERINE 4.5 ZUMI (MISCELLANEOUS) ×4 IMPLANT
NEEDLE HYPO 22GX1.5 SAFETY (NEEDLE) ×4 IMPLANT
NEEDLE SPNL 18GX3.5 QUINCKE PK (NEEDLE) IMPLANT
OBTURATOR OPTICAL STANDARD 8MM (TROCAR) ×4
OBTURATOR OPTICAL STND 8 DVNC (TROCAR) ×2
OBTURATOR OPTICALSTD 8 DVNC (TROCAR) ×2 IMPLANT
PACK ROBOT GYN CUSTOM WL (TRAY / TRAY PROCEDURE) ×4 IMPLANT
PAD POSITIONING PINK XL (MISCELLANEOUS) ×4 IMPLANT
PORT ACCESS TROCAR AIRSEAL 12 (TROCAR) ×2 IMPLANT
PORT ACCESS TROCAR AIRSEAL 5M (TROCAR) ×2
POUCH SPECIMEN RETRIEVAL 10MM (ENDOMECHANICALS) IMPLANT
RETRACTOR WND ALEXIS 25 LRG (MISCELLANEOUS) IMPLANT
RTRCTR WOUND ALEXIS 18CM MED (MISCELLANEOUS)
RTRCTR WOUND ALEXIS 25CM LRG (MISCELLANEOUS)
SCRUB CHG 4% DYNA-HEX 4OZ (MISCELLANEOUS) ×2 IMPLANT
SEAL CANN UNIV 5-8 DVNC XI (MISCELLANEOUS) ×8 IMPLANT
SEAL XI 5MM-8MM UNIVERSAL (MISCELLANEOUS) ×16
SET TRI-LUMEN FLTR TB AIRSEAL (TUBING) ×4 IMPLANT
SPONGE LAP 18X18 RF (DISPOSABLE) IMPLANT
SURGIFLO W/THROMBIN 8M KIT (HEMOSTASIS) IMPLANT
SUT MNCRL AB 4-0 PS2 18 (SUTURE) IMPLANT
SUT PDS AB 1 TP1 96 (SUTURE) IMPLANT
SUT VIC AB 0 CT1 27 (SUTURE) ×4
SUT VIC AB 0 CT1 27XBRD ANTBC (SUTURE) ×2 IMPLANT
SUT VIC AB 2-0 CT1 27 (SUTURE)
SUT VIC AB 2-0 CT1 TAPERPNT 27 (SUTURE) IMPLANT
SUT VIC AB 4-0 PS2 18 (SUTURE) ×8 IMPLANT
SUT VLOC 180 0 6IN GS21 (SUTURE) ×4 IMPLANT
SYR 10ML LL (SYRINGE) IMPLANT
SYR 20ML LL LF (SYRINGE) IMPLANT
SYR 50ML LL SCALE MARK (SYRINGE) IMPLANT
TOWEL OR NON WOVEN STRL DISP B (DISPOSABLE) ×4 IMPLANT
TRAP SPECIMEN MUCUS 40CC (MISCELLANEOUS) IMPLANT
TRAY FOLEY MTR SLVR 16FR STAT (SET/KITS/TRAYS/PACK) ×4 IMPLANT
TROCAR XCEL NON-BLD 5MMX100MML (ENDOMECHANICALS) IMPLANT
UNDERPAD 30X36 HEAVY ABSORB (UNDERPADS AND DIAPERS) ×4 IMPLANT
WATER STERILE IRR 1000ML POUR (IV SOLUTION) ×4 IMPLANT
YANKAUER SUCT BULB TIP 10FT TU (MISCELLANEOUS) IMPLANT

## 2020-09-11 NOTE — Anesthesia Postprocedure Evaluation (Signed)
Anesthesia Post Note  Patient: Barbara Golden  Procedure(s) Performed: XI ROBOTIC ASSISTED TOTAL HYSTERECTOMY WITH BILATERAL SALPINGO OOPHORECTOMY (Bilateral ) SENTINEL NODE BIOPSY (N/A )     Patient location during evaluation: PACU Anesthesia Type: General Level of consciousness: awake and alert Pain management: pain level controlled Vital Signs Assessment: post-procedure vital signs reviewed and stable Respiratory status: spontaneous breathing, nonlabored ventilation and respiratory function stable Cardiovascular status: blood pressure returned to baseline and stable Postop Assessment: no apparent nausea or vomiting Anesthetic complications: no   No complications documented.  Last Vitals:  Vitals:   09/11/20 1114 09/11/20 1115  BP:  (!) 143/67  Pulse: 74 77  Resp: 18   Temp: (!) 36.3 C   SpO2:  93%    Last Pain:  Vitals:   09/11/20 1115  TempSrc:   PainSc: 0-No pain                 Catalina Gravel

## 2020-09-11 NOTE — Interval H&P Note (Signed)
History and Physical Interval Note:  09/11/2020 7:07 AM  Barbara Golden  has presented today for surgery, with the diagnosis of ENDOMETRIAL CANCER.  The various methods of treatment have been discussed with the patient and family. After consideration of risks, benefits and other options for treatment, the patient has consented to  Procedure(s): XI ROBOTIC ASSISTED TOTAL HYSTERECTOMY WITH BILATERAL SALPINGO OOPHORECTOMY (Bilateral) SENTINEL NODE BIOPSY, POSSIBLE LYMPH NODE DISSECTION (N/A) as a surgical intervention.  The patient's history has been reviewed, patient examined, no change in status, stable for surgery.  I have reviewed the patient's chart and labs.  Questions were answered to the patient's satisfaction.     Thereasa Solo

## 2020-09-11 NOTE — Transfer of Care (Signed)
Immediate Anesthesia Transfer of Care Note  Patient: Barbara Golden  Procedure(s) Performed: XI ROBOTIC ASSISTED TOTAL HYSTERECTOMY WITH BILATERAL SALPINGO OOPHORECTOMY (Bilateral ) SENTINEL NODE BIOPSY (N/A )  Patient Location: PACU  Anesthesia Type:General  Level of Consciousness: awake, alert  and oriented  Airway & Oxygen Therapy: Patient Spontanous Breathing and Patient connected to face mask  Post-op Assessment: Report given to RN and Post -op Vital signs reviewed and stable  Post vital signs: Reviewed and stable  Last Vitals:  Vitals Value Taken Time  BP 156/75 09/11/20 0921  Temp    Pulse 106 09/11/20 0924  Resp 15 09/11/20 0924  SpO2 100 % 09/11/20 0924  Vitals shown include unvalidated device data.  Last Pain:  Vitals:   09/11/20 0624  TempSrc: Oral         Complications: No complications documented.

## 2020-09-11 NOTE — Discharge Instructions (Signed)
Return to work: 4 weeks (2 weeks with physical restrictions).  Activity: 1. Be up and out of the bed during the day.  Take a nap if needed.  You may walk up steps but be careful and use the hand rail.  Stair climbing will tire you more than you think, you may need to stop part way and rest.   2. No lifting or straining for 4 weeks.  3. No driving for 1 weeks.  Do Not drive if you are taking narcotic pain medicine.  4. Shower daily.  Use soap and water on your incision and pat dry; don't rub.   5. No sexual activity and nothing in the vagina for 8 weeks.  Medications:  - Take ibuprofen and tylenol first line for pain control. Take these regularly (every 6 hours) to decrease the build up of pain.  - If necessary, for severe pain not relieved by ibuprofen, contact Dr Rossi's office and you will be prescribed percocet.  - While taking percocet you should take sennakot every night to reduce the likelihood of constipation. If this causes diarrhea, stop its use.  Diet: 1. Low sodium Heart Healthy Diet is recommended.  2. It is safe to use a laxative if you have difficulty moving your bowels.   Wound Care: 1. Keep clean and dry.  Shower daily.  Reasons to call the Doctor:  Fever - Oral temperature greater than 100.4 degrees Fahrenheit Foul-smelling vaginal discharge Difficulty urinating Nausea and vomiting Increased pain at the site of the incision that is unrelieved with pain medicine. Difficulty breathing with or without chest pain New calf pain especially if only on one side Sudden, continuing increased vaginal bleeding with or without clots.   Follow-up: 1. See Emma Rossi in 4 weeks.  Contacts: For questions or concerns you should contact:  Dr. Emma Rossi at [phone] After hours and on week-ends call [phone] and ask to speak to the physician on call for Gynecologic Oncology  

## 2020-09-11 NOTE — Anesthesia Preprocedure Evaluation (Addendum)
Anesthesia Evaluation  Patient identified by MRN, date of birth, ID band Patient awake    Reviewed: Allergy & Precautions, NPO status , Patient's Chart, lab work & pertinent test results  Airway Mallampati: II  TM Distance: >3 FB Neck ROM: Full    Dental  (+) Dental Advisory Given, Upper Dentures, Partial Lower   Pulmonary former smoker,    Pulmonary exam normal breath sounds clear to auscultation       Cardiovascular hypertension, Normal cardiovascular exam Rhythm:Regular Rate:Normal     Neuro/Psych negative neurological ROS  negative psych ROS   GI/Hepatic Neg liver ROS, GERD  Medicated,  Endo/Other  diabetes, Type 2, Oral Hypoglycemic Agents  Renal/GU negative Renal ROS     Musculoskeletal negative musculoskeletal ROS (+)   Abdominal   Peds  Hematology negative hematology ROS (+)   Anesthesia Other Findings Day of surgery medications reviewed with the patient.  Reproductive/Obstetrics ENDOMETRIAL CANCER                            Anesthesia Physical Anesthesia Plan  ASA: III  Anesthesia Plan: General   Post-op Pain Management:    Induction: Intravenous  PONV Risk Score and Plan: 4 or greater and Propofol infusion, Dexamethasone and Ondansetron  Airway Management Planned: Oral ETT  Additional Equipment:   Intra-op Plan:   Post-operative Plan: Extubation in OR  Informed Consent: I have reviewed the patients History and Physical, chart, labs and discussed the procedure including the risks, benefits and alternatives for the proposed anesthesia with the patient or authorized representative who has indicated his/her understanding and acceptance.     Dental advisory given  Plan Discussed with: CRNA  Anesthesia Plan Comments: (2nd PIV)        Anesthesia Quick Evaluation

## 2020-09-11 NOTE — Anesthesia Procedure Notes (Signed)
Procedure Name: Intubation Performed by: Kiree Dejarnette A, CRNA Pre-anesthesia Checklist: Patient identified, Emergency Drugs available, Suction available and Patient being monitored Patient Re-evaluated:Patient Re-evaluated prior to induction Oxygen Delivery Method: Circle system utilized Preoxygenation: Pre-oxygenation with 100% oxygen Induction Type: IV induction Ventilation: Mask ventilation without difficulty Laryngoscope Size: Miller and 2 Grade View: Grade I Tube type: Oral Number of attempts: 1 Airway Equipment and Method: Stylet Placement Confirmation: ETT inserted through vocal cords under direct vision,  positive ETCO2 and breath sounds checked- equal and bilateral Secured at: 22 cm Tube secured with: Tape Dental Injury: Teeth and Oropharynx as per pre-operative assessment        

## 2020-09-11 NOTE — Op Note (Signed)
OPERATIVE NOTE 09/11/20  Surgeon: Donaciano Eva   Assistants: Dr Lahoma Crocker (an MD assistant was necessary for tissue manipulation, management of robotic instrumentation, retraction and positioning due to the complexity of the case and hospital policies).   Anesthesia: General endotracheal anesthesia  ASA Class: 3   Pre-operative Diagnosis: endometrial cancer grade 1  Post-operative Diagnosis: same,   Operation: Robotic-assisted laparoscopic total hysterectomy with bilateral salpingoophorectomy, SLN biopsy   Surgeon: Donaciano Eva  Assistant Surgeon: Lahoma Crocker MD  Anesthesia: GET  Urine Output: 200cc  Operative Findings:  : 6cm uterus normal appearing, normal tubes and ovaries, no suspicious nodes.   Estimated Blood Loss:  20cc      Total IV Fluids: 800 ml         Specimens: uterus, cervix, bilateral tubes and ovaries, right and left sentinel lymph node.          Complications:  None; patient tolerated the procedure well.         Disposition: PACU - hemodynamically stable.  Procedure Details  The patient was seen in the Holding Room. The risks, benefits, complications, treatment options, and expected outcomes were discussed with the patient.  The patient concurred with the proposed plan, giving informed consent.  The site of surgery properly noted/marked. The patient was identified as Barbara Golden and the procedure verified as a Robotic-assisted hysterectomy with bilateral salpingo oophorectomy with SLN biopsy. A Time Out was held and the above information confirmed.  After induction of anesthesia, the patient was draped and prepped in the usual sterile manner. Pt was placed in supine position after anesthesia and draped and prepped in the usual sterile manner. The abdominal drape was placed after the CholoraPrep had been allowed to dry for 3 minutes.  Her arms were tucked to her side with all appropriate precautions.  The shoulders were  stabilized with padded shoulder blocks applied to the acromium processes.  The patient was placed in the semi-lithotomy position in [place].  The perineum was prepped with Betadine. The patient was then prepped. Foley catheter was placed.  A sterile speculum was placed in the vagina.  The cervix was grasped with a single-tooth tenaculum. 2mg  total of ICG was injected into the cervical stroma at 2 and 9 o'clock with 1cc injected at a 1cm and 8mm depth (concentration 0.5mg /ml) in all locations. The cervix was dilated with Kennon Rounds dilators.  The ZUMI uterine manipulator with a medium colpotomizer ring was placed without difficulty.  A pneum occluder balloon was placed over the manipulator.  OG tube placement was confirmed and to suction.   Next, a 5 mm skin incision was made 1 cm below the subcostal margin in the midclavicular line.  The 5 mm Optiview port and scope was used for direct entry.  Opening pressure was under 10 mm CO2.  The abdomen was insufflated and the findings were noted as above.   At this point and all points during the procedure, the patient's intra-abdominal pressure did not exceed 15 mmHg. Next, a 10 mm skin incision was made immediately below the umbilicus and a right and left port was placed about 10 cm lateral to the robot port on the right and left side. All ports were placed under direct visualization.  The patient was placed in steep Trendelenburg.  Bowel was folded away into the upper abdomen.  The robot was docked in the normal manner.  The right and left peritoneum were opened parallel to the IP ligament to open the  retroperitoneal spaces bilaterally. The SLN mapping was performed in bilateral pelvic basins. The para rectal and paravesical spaces were opened up entirely with careful dissection below the level of the ureters bilaterally and to the depth of the uterine artery origin in order to skeletonize the uterine "web" and ensure visualization of all parametrial channels. The  para-aortic basins were carefully exposed and evaluated for isolated para-aortic SLN's. Lymphatic channels were identified travelling to the following visualized sentinel lymph node's: right and left external iliac. These SLN's were separated from their surrounding lymphatic tissue, removed and sent for permanent pathology.  The hysterectomy was started after the round ligament on the right side was incised and the retroperitoneum was entered and the pararectal space was developed.  The ureter was noted to be on the medial leaf of the broad ligament.  The peritoneum above the ureter was incised and stretched and the infundibulopelvic ligament was skeletonized, cauterized and cut.  The posterior peritoneum was taken down to the level of the KOH ring.  The anterior peritoneum was also taken down.  The bladder flap was created to the level of the KOH ring.  The uterine artery on the right side was skeletonized, cauterized and cut in the normal manner.  A similar procedure was performed on the left.  The colpotomy was made and the uterus, cervix, bilateral ovaries and tubes were amputated and delivered through the vagina.  Pedicles were inspected and excellent hemostasis was achieved.    The colpotomy at the vaginal cuff was closed with Vicryl on a CT1 needle in a running manner.  Irrigation was used and excellent hemostasis was achieved.  At this point in the procedure was completed.  Robotic instruments were removed under direct visulaization.  The robot was undocked. The 10 mm ports were closed with Vicryl on a UR-5 needle and the fascia was closed with 0 Vicryl on a UR-5 needle.  The skin was closed with 4-0 Vicryl in a subcuticular manner.  Dermabond was applied.  Sponge, lap and needle counts correct x 2.  The patient was taken to the recovery room in stable condition.  The vagina was swabbed with  minimal bleeding noted.   All instrument and needle counts were correct x  3.   The patient was transferred  to the recovery room in a stable condition.  Donaciano Eva, MD

## 2020-09-12 ENCOUNTER — Telehealth: Payer: Self-pay

## 2020-09-12 ENCOUNTER — Encounter (HOSPITAL_COMMUNITY): Payer: Self-pay | Admitting: Gynecologic Oncology

## 2020-09-12 NOTE — Telephone Encounter (Signed)
Barbara Golden states that she is eating,drinking, and urinating well. Passing gas. Will increase senokot-s to 2 tabs bid with 8 oz of water.  Afebrile. Incisions D&I Pain controlled currently with intermittent tylenol. Advised Barbara Busic that she should wait for visitors until the weekend and use masks and social distancing to decrease chance of contracting covid. Pt verbalized understanding.  Pt aware of post op appointments as well as the office number [phone] and after hours number [phone] to call if she has any questions or concerns

## 2020-09-18 ENCOUNTER — Inpatient Hospital Stay: Payer: Medicare HMO | Attending: Gynecologic Oncology | Admitting: Gynecologic Oncology

## 2020-09-18 ENCOUNTER — Encounter: Payer: Self-pay | Admitting: Gynecologic Oncology

## 2020-09-18 DIAGNOSIS — Z79899 Other long term (current) drug therapy: Secondary | ICD-10-CM | POA: Insufficient documentation

## 2020-09-18 DIAGNOSIS — E1136 Type 2 diabetes mellitus with diabetic cataract: Secondary | ICD-10-CM | POA: Insufficient documentation

## 2020-09-18 DIAGNOSIS — Z87891 Personal history of nicotine dependence: Secondary | ICD-10-CM | POA: Insufficient documentation

## 2020-09-18 DIAGNOSIS — C541 Malignant neoplasm of endometrium: Secondary | ICD-10-CM

## 2020-09-18 DIAGNOSIS — E7800 Pure hypercholesterolemia, unspecified: Secondary | ICD-10-CM | POA: Insufficient documentation

## 2020-09-18 DIAGNOSIS — Z7984 Long term (current) use of oral hypoglycemic drugs: Secondary | ICD-10-CM | POA: Insufficient documentation

## 2020-09-18 DIAGNOSIS — Z90710 Acquired absence of both cervix and uterus: Secondary | ICD-10-CM | POA: Insufficient documentation

## 2020-09-18 DIAGNOSIS — Z7189 Other specified counseling: Secondary | ICD-10-CM

## 2020-09-18 DIAGNOSIS — K219 Gastro-esophageal reflux disease without esophagitis: Secondary | ICD-10-CM | POA: Insufficient documentation

## 2020-09-18 DIAGNOSIS — Z90722 Acquired absence of ovaries, bilateral: Secondary | ICD-10-CM

## 2020-09-18 LAB — SURGICAL PATHOLOGY

## 2020-09-18 NOTE — Progress Notes (Signed)
Follow-up Note: Gyn-Onc  Consult was requested by Dr. Quincy Golden for the evaluation of Barbara Golden 85 y.o. female  CC:  Chief Complaint  Patient presents with  . endometrial cancer  . counseling and coordination of care    Assessment/Plan:  [name]  is a 85 y.o.  year old 77 with a stage IA grade 1 endometrial cancer (MMRd). S/p surgical staging on 09/11/00.  Pathology revealed low risk factors for recurrence, therefore no adjuvant therapy is recommended according to NCCN guidelines.  I discussed risk for recurrence and typical symptoms encouraged her to notify us of these should they develop between visits.  I recommend she have follow-up every 6 months for 5 years in accordance with NCCN guidelines. Those visits should include symptom assessment, physical exam and pelvic examination. Pap smears are not indicated or recommended in the routine surveillance of endometrial cancer.  HPI: [name] is a 85 year old P1 who was seen in consultation at the request of Dr Barbara Golden for evaluation and treatment of grade 1 endometrial cancer.   Her symptoms began in the first week of February, 2022 with postmenopausal bleeding.  She saw Dr Barbara Golden for a symptom visit on 08/23/20.  Work-up of symptoms included a transvaginal US and endometrial biopsy. Transvaginal US on 08/23/20 showed a uterus measurng 7.5x3x3.9cm with a 17.45m endometrial stripe. The ovaries were normal bilaterally. Endometrial sampling with endometrial pipelle was performed on 08/23/20 and showed grade 1 endometrial cancer.   Interval Hx:  On 09/11/00 she underwent robotic assisted total hysterectomy, BSO, bilateral SLN biopsy. Intraoperative findings were significant for no gross evidence of extrauterine disease. Surgery was uncomplicated.  Final pathology revealed a FIGO grade 1 endometrioid endometrial adenocarcinoma with inner half invasion, no LVSI, negative nodes, cervix and adnexa. MMR deficiency was identified on  [facility].  Since surgery she has done well with no complaints.  Current Meds:  Outpatient Encounter Medications as of 09/18/2020  Medication Sig  . Continuous Blood Gluc Receiver (FREESTYLE LIBRE 14 DAY READER) DEVI 1 Units by Does not apply route 2 (two) times daily. H35.30, E11.65  . Continuous Blood Gluc Sensor (FREESTYLE LIBRE 14 DAY SENSOR) MISC Apply 1 sensor every 14 days E11.319  . Elastic Bandages & Supports (MEDICAL COMPRESSION THIGH HIGH) MISC 1 each by Does not apply route daily.  . famotidine (PEPCID) 10 MG tablet Take 10 mg by mouth 2 (two) times daily as needed for heartburn or indigestion.  . fexofenadine (ALLEGRA) 60 MG tablet Take 60 mg by mouth daily.  .Marland Kitchengabapentin (NEURONTIN) 100 MG capsule Take 1 capsule (100 mg total) by mouth at bedtime. (Patient taking differently: Take 100 mg by mouth 2 (two) times daily.)  . metFORMIN (GLUCOPHAGE) 1000 MG tablet Take 1 tablet (1,000 mg total) by mouth 2 (two) times daily with a meal.  . Multiple Vitamins-Minerals (ICAPS AREDS 2 PO) Take 1 capsule by mouth in the morning and at bedtime.  . rosuvastatin (CRESTOR) 5 MG tablet Take 1 tablet (5 mg total) by mouth daily.  .Marland Kitchensenna-docusate (SENOKOT-S) 8.6-50 MG tablet Take 2 tablets by mouth at bedtime. For AFTER surgery, do not take if having diarrhea  . traMADol (ULTRAM) 50 MG tablet Take 1 tablet (50 mg total) by mouth every 6 (six) hours as needed for severe pain. For AFTER surgery, do not take and drive  . TRULICITY 04.43MXV/4.0GQSOPN INJECT 0.75 MG INTO THE SKIN ONCE A WEEK.  . [DISCONTINUED] cetirizine (ZYRTEC) 10 MG tablet  Take 10 mg by mouth daily as needed for allergies.  . [DISCONTINUED] ranitidine (ZANTAC) 150 MG tablet Take 150 mg by mouth daily as needed for heartburn.   No facility-administered encounter medications on file as of 09/18/2020.    Allergy:  Allergies  Allergen Reactions  . Codeine Other (See Comments)    Causes lip burning and hyperactivity    Social Hx:    Social History   Socioeconomic History  . Marital status: Widowed    Spouse name: Not on file  . Number of children: 1  . Years of education: Not on file  . Highest education level: Not on file  Occupational History  . Not on file  Tobacco Use  . Smoking status: Former Smoker    Packs/day: 0.50    Types: Cigarettes    Quit date: 03/26/2009    Years since quitting: 11.4  . Smokeless tobacco: Never Used  Vaping Use  . Vaping Use: Never used  Substance and Sexual Activity  . Alcohol use: No  . Drug use: No  . Sexual activity: Not Currently  Other Topics Concern  . Not on file  Social History Narrative   3 Step Daughters/lives with Daughter(Pam Bouwer) and son-in-law.   Social Determinants of Health   Financial Resource Strain: Not on file  Food Insecurity: Not on file  Transportation Needs: Not on file  Physical Activity: Not on file  Stress: Not on file  Social Connections: Not on file  Intimate Partner Violence: Not on file    Past Surgical Hx:  Past Surgical History:  Procedure Laterality Date  . ROBOTIC ASSISTED TOTAL HYSTERECTOMY WITH BILATERAL SALPINGO OOPHERECTOMY Bilateral 09/11/2020   Procedure: XI ROBOTIC ASSISTED TOTAL HYSTERECTOMY WITH BILATERAL SALPINGO OOPHORECTOMY;  Surgeon: Barbara Amber, MD;  Location: [facility];  Service: Gynecology;  Laterality: Bilateral;  . SENTINEL NODE BIOPSY N/A 09/11/2020   Procedure: SENTINEL NODE BIOPSY;  Surgeon: Barbara Amber, MD;  Location: [facility];  Service: Gynecology;  Laterality: N/A;  . TUBAL LIGATION Bilateral     Past Medical Hx:  Past Medical History:  Diagnosis Date  . Allergy   . Cancer [facility])    endometrial  . Cataract   . Diabetes mellitus without complication ([place])    type 2  . GERD (gastroesophageal reflux disease)   . High cholesterol   . Jaundice    as a child  . Macular degeneration   . Varicose veins of lower extremity    right leg    Past Gynecological History:  See HPI, she has had 1 prior vaginal  delivery Patient's last menstrual period was 07/21/1998 (approximate).  Family Hx:  Family History  Problem Relation Age of Onset  . Cancer Mother        colon/stomach  . Heart disease Father   . Cancer Nephew        bile duct    Review of Systems:  Constitutional  Feels well,    ENT Normal appearing ears and nares bilaterally Skin/Breast  No rash, sores, jaundice, itching, dryness Cardiovascular  No chest pain, shortness of breath, or edema  Pulmonary  No cough or wheeze.  Gastro Intestinal  No nausea, vomitting, or diarrhoea. No bright red blood per rectum, no abdominal pain, change in bowel movement, or constipation.  Genito Urinary  No frequency, urgency, dysuria, + postmenopausal bleeding Musculo Skeletal  No myalgia, arthralgia, joint swelling or pain  Neurologic  No weakness, numbness, change in gait,  Psychology  No depression, anxiety, insomnia.  Vitals:  Last menstrual period 07/21/1998.  Physical Exam: Deferred.  I discussed the assessment and treatment plan with the patient. The patient was provided with an opportunity to ask questions and all were answered. The patient agreed with the plan and demonstrated an understanding of the instructions.   The patient was advised to call back or see an in-person evaluation if the symptoms worsen or if the condition fails to improve as anticipated.   I provided 10 minutes of non face-to-face telephone visit time during this encounter, and > 50% was spent counseling as documented under my assessment & plan.    Thereasa Solo, MD  09/18/2020, 3:38 PM

## 2020-09-19 ENCOUNTER — Inpatient Hospital Stay: Payer: Medicare HMO | Admitting: Gynecologic Oncology

## 2020-09-20 ENCOUNTER — Encounter (INDEPENDENT_AMBULATORY_CARE_PROVIDER_SITE_OTHER): Payer: Self-pay

## 2020-09-24 ENCOUNTER — Telehealth: Payer: Self-pay | Admitting: *Deleted

## 2020-09-24 ENCOUNTER — Other Ambulatory Visit: Payer: Self-pay

## 2020-09-24 NOTE — Telephone Encounter (Signed)
Per Melissa called pathology and added MSI to surgery path

## 2020-09-28 ENCOUNTER — Telehealth: Payer: Self-pay

## 2020-09-28 ENCOUNTER — Other Ambulatory Visit: Payer: Self-pay

## 2020-09-28 ENCOUNTER — Ambulatory Visit: Payer: Medicare HMO | Admitting: Family

## 2020-09-28 NOTE — Progress Notes (Signed)
Follow-up Note: Gyn-Onc  Consult was requested by Dr. Quincy Simmonds for the evaluation of Barbara Golden 85 y.o. female  CC:  Chief Complaint  Patient presents with  . Endometrial cancer ([place])  . Counseling and coordination of care    Assessment/Plan:  Barbara Golden  is a 85 y.o.  year old 43 with a stage IA grade 1 endometrial cancer (MMRd). S/p surgical staging on 09/11/20.  Pathology revealed low risk factors for recurrence, therefore no adjuvant therapy is recommended according to NCCN guidelines.  I discussed risk for recurrence and typical symptoms encouraged her to notify us of these should they develop between visits.  I recommend she have follow-up every 6 months for 5 years in accordance with NCCN guidelines. Those visits should include symptom assessment, physical exam and pelvic examination. Pap smears are not indicated or recommended in the routine surveillance of endometrial cancer.  HPI: Barbara Golden is a 85 year old P1 who was seen in consultation at the request of Dr Quincy Simmonds for evaluation and treatment of grade 1 endometrial cancer.   Her symptoms began in the first week of February, 2022 with postmenopausal bleeding.  She saw Dr Quincy Simmonds for a symptom visit on 08/23/20.  Work-up of symptoms included a transvaginal US and endometrial biopsy. Transvaginal US on 08/23/20 showed a uterus measurng 7.5x3x3.9cm with a 17.93m endometrial stripe. The ovaries were normal bilaterally. Endometrial sampling with endometrial pipelle was performed on 08/23/20 and showed grade 1 endometrial cancer.   Interval Hx:  On 09/11/00 she underwent robotic assisted total hysterectomy, BSO, bilateral SLN biopsy. Intraoperative findings were significant for no gross evidence of extrauterine disease. Surgery was uncomplicated.  Final pathology revealed a FIGO grade 1 endometrioid endometrial adenocarcinoma with inner half invasion, no LVSI, negative nodes, cervix and adnexa. MMR deficiency was identified  on IHC.  Since surgery she has done well with no complaints.  Current Meds:  Outpatient Encounter Medications as of 10/01/2020  Medication Sig  . Continuous Blood Gluc Receiver (FREESTYLE LIBRE 14 DAY READER) DEVI 1 Units by Does not apply route 2 (two) times daily. H35.30, E11.65  . Continuous Blood Gluc Sensor (FREESTYLE LIBRE 14 DAY SENSOR) MISC Apply 1 sensor every 14 days E11.319  . Elastic Bandages & Supports (MEDICAL COMPRESSION THIGH HIGH) MISC 1 each by Does not apply route daily.  . famotidine (PEPCID) 10 MG tablet Take 10 mg by mouth 2 (two) times daily as needed for heartburn or indigestion.  . fexofenadine (ALLEGRA) 60 MG tablet Take 60 mg by mouth daily.  .Marland Kitchengabapentin (NEURONTIN) 100 MG capsule Take 1 capsule (100 mg total) by mouth at bedtime. (Patient taking differently: Take 100 mg by mouth 2 (two) times daily.)  . metFORMIN (GLUCOPHAGE) 1000 MG tablet Take 1 tablet (1,000 mg total) by mouth 2 (two) times daily with a meal.  . Multiple Vitamins-Minerals (ICAPS AREDS 2 PO) Take 1 capsule by mouth in the morning and at bedtime.  . rosuvastatin (CRESTOR) 5 MG tablet Take 1 tablet (5 mg total) by mouth daily.  . TRULICITY 07.62MUQ/3.3HLSOPN INJECT 0.75 MG INTO THE SKIN ONCE A WEEK.  . [DISCONTINUED] senna-docusate (SENOKOT-S) 8.6-50 MG tablet Take 2 tablets by mouth at bedtime. For AFTER surgery, do not take if having diarrhea  . [DISCONTINUED] traMADol (ULTRAM) 50 MG tablet Take 1 tablet (50 mg total) by mouth every 6 (six) hours as needed for severe pain. For AFTER surgery, do not take and drive  . [DISCONTINUED] cetirizine (ZYRTEC)  10 MG tablet Take 10 mg by mouth daily as needed for allergies.  . [DISCONTINUED] ranitidine (ZANTAC) 150 MG tablet Take 150 mg by mouth daily as needed for heartburn.   No facility-administered encounter medications on file as of 10/01/2020.    Allergy:  Allergies  Allergen Reactions  . Codeine Other (See Comments)    Causes lip burning and  hyperactivity    Social Hx:   Social History   Socioeconomic History  . Marital status: Widowed    Spouse name: Not on file  . Number of children: 1  . Years of education: Not on file  . Highest education level: Not on file  Occupational History  . Occupation: retired  Tobacco Use  . Smoking status: Former Smoker    Packs/day: 0.50    Types: Cigarettes    Quit date: 03/26/2009    Years since quitting: 11.5  . Smokeless tobacco: Never Used  Vaping Use  . Vaping Use: Never used  Substance and Sexual Activity  . Alcohol use: No  . Drug use: No  . Sexual activity: Not Currently  Other Topics Concern  . Not on file  Social History Narrative   3 Step Daughters/lives with Daughter(Pam Bouwer) and son-in-law.   Social Determinants of Health   Financial Resource Strain: Not on file  Food Insecurity: Not on file  Transportation Needs: Not on file  Physical Activity: Not on file  Stress: Not on file  Social Connections: Not on file  Intimate Partner Violence: Not on file    Past Surgical Hx:  Past Surgical History:  Procedure Laterality Date  . ROBOTIC ASSISTED TOTAL HYSTERECTOMY WITH BILATERAL SALPINGO OOPHERECTOMY Bilateral 09/11/2020   Procedure: XI ROBOTIC ASSISTED TOTAL HYSTERECTOMY WITH BILATERAL SALPINGO OOPHORECTOMY;  Surgeon: Everitt Amber, MD;  Location: [facility];  Service: Gynecology;  Laterality: Bilateral;  . SENTINEL NODE BIOPSY N/A 09/11/2020   Procedure: SENTINEL NODE BIOPSY;  Surgeon: Everitt Amber, MD;  Location: [facility];  Service: Gynecology;  Laterality: N/A;  . TUBAL LIGATION Bilateral     Past Medical Hx:  Past Medical History:  Diagnosis Date  . Allergy   . Cancer [facility])    endometrial  . Cataract   . Diabetes mellitus without complication ([place])    type 2  . GERD (gastroesophageal reflux disease)   . High cholesterol   . Jaundice    as a child  . Macular degeneration   . Varicose veins of lower extremity    right leg    Past Gynecological  History:  See HPI, she has had 1 prior vaginal delivery Patient's last menstrual period was 07/21/1998 (approximate).  Family Hx:  Family History  Problem Relation Age of Onset  . Cancer Mother        colon/stomach  . Heart disease Father   . Cancer Nephew        bile duct    Review of Systems:  Constitutional  Feels well,    ENT Normal appearing ears and nares bilaterally Skin/Breast  No rash, sores, jaundice, itching, dryness Cardiovascular  No chest pain, shortness of breath, or edema  Pulmonary  No cough or wheeze.  Gastro Intestinal  No nausea, vomitting, or diarrhoea. No bright red blood per rectum, no abdominal pain, change in bowel movement, or constipation.  Genito Urinary  No frequency, urgency, dysuria, no bleeding Musculo Skeletal  No myalgia, arthralgia, joint swelling or pain  Neurologic  No weakness, numbness, change in gait,  Psychology  No depression, anxiety,  insomnia.   Vitals:  Blood pressure (!) 146/65, pulse 90, temperature (!) 97.4 F (36.3 C), temperature source Tympanic, resp. rate 20, height 5' 4"  (1.626 m), weight 146 lb (66.2 kg), last menstrual period 07/21/1998, SpO2 99 %.  Physical Exam: WD in NAD Neck  Supple NROM, without any enlargements.  Lymph Node Survey No cervical supraclavicular or inguinal adenopathy Cardiovascular  Pulse normal rate, regularity and rhythm. S1 and S2 normal.  Lungs  Clear to auscultation bilateraly, without wheezes/crackles/rhonchi. Good air movement.  Skin  No rash/lesions/breakdown  Psychiatry  Alert and oriented to person, place, and time  Abdomen  Normoactive bowel sounds, abdomen soft, non-tender and obese without evidence of hernia. Well healed incisions. Back No CVA tenderness Genito Urinary  Vaginal cuff well healed, in tact.  Rectal  Good tone, no masses no cul de sac nodularity.  Extremities  No bilateral cyanosis, clubbing or edema.   30 minutes of direct face to face counseling time  was spent with the patient. This included discussion about prognosis, therapy recommendations and postoperative side effects and are beyond the scope of routine postoperative care.   Thereasa Solo, MD  10/01/2020, 4:27 PM

## 2020-09-28 NOTE — Telephone Encounter (Signed)
3 unsuccessful attempts to reach patient. Times are as listed 3:47pm, 4:04pm, and 4:25pm. Patient unarrived and administration staff with follow the no call no show protocol.

## 2020-09-28 NOTE — Telephone Encounter (Signed)
Noted  

## 2020-10-01 ENCOUNTER — Encounter: Payer: Self-pay | Admitting: Gynecologic Oncology

## 2020-10-01 ENCOUNTER — Inpatient Hospital Stay (HOSPITAL_BASED_OUTPATIENT_CLINIC_OR_DEPARTMENT_OTHER): Payer: Medicare HMO | Admitting: Gynecologic Oncology

## 2020-10-01 ENCOUNTER — Other Ambulatory Visit: Payer: Self-pay

## 2020-10-01 VITALS — BP 146/65 | HR 90 | Temp 97.4°F | Resp 20 | Ht 64.0 in | Wt 146.0 lb

## 2020-10-01 DIAGNOSIS — Z7984 Long term (current) use of oral hypoglycemic drugs: Secondary | ICD-10-CM | POA: Diagnosis not present

## 2020-10-01 DIAGNOSIS — C541 Malignant neoplasm of endometrium: Secondary | ICD-10-CM | POA: Diagnosis not present

## 2020-10-01 DIAGNOSIS — E1136 Type 2 diabetes mellitus with diabetic cataract: Secondary | ICD-10-CM | POA: Diagnosis not present

## 2020-10-01 DIAGNOSIS — Z90710 Acquired absence of both cervix and uterus: Secondary | ICD-10-CM | POA: Diagnosis not present

## 2020-10-01 DIAGNOSIS — E7800 Pure hypercholesterolemia, unspecified: Secondary | ICD-10-CM | POA: Diagnosis not present

## 2020-10-01 DIAGNOSIS — K219 Gastro-esophageal reflux disease without esophagitis: Secondary | ICD-10-CM | POA: Diagnosis not present

## 2020-10-01 DIAGNOSIS — Z87891 Personal history of nicotine dependence: Secondary | ICD-10-CM | POA: Diagnosis not present

## 2020-10-01 DIAGNOSIS — Z7189 Other specified counseling: Secondary | ICD-10-CM

## 2020-10-01 DIAGNOSIS — Z90722 Acquired absence of ovaries, bilateral: Secondary | ICD-10-CM | POA: Diagnosis not present

## 2020-10-01 DIAGNOSIS — Z79899 Other long term (current) drug therapy: Secondary | ICD-10-CM | POA: Diagnosis not present

## 2020-10-01 NOTE — Patient Instructions (Signed)
Please notify Dr Denman George at phone number [phone] if you notice vaginal bleeding, new pelvic or abdominal pains, bloating, feeling full easy, or a change in bladder or bowel function.   Please contact Dr Serita Grit office (at [phone]) in or after June, 2022 to request an appointment with her for September, 2022.

## 2020-10-16 DIAGNOSIS — E663 Overweight: Secondary | ICD-10-CM | POA: Diagnosis not present

## 2020-10-16 DIAGNOSIS — E785 Hyperlipidemia, unspecified: Secondary | ICD-10-CM | POA: Diagnosis not present

## 2020-10-16 DIAGNOSIS — N393 Stress incontinence (female) (male): Secondary | ICD-10-CM | POA: Diagnosis not present

## 2020-10-16 DIAGNOSIS — H5360 Unspecified night blindness: Secondary | ICD-10-CM | POA: Diagnosis not present

## 2020-10-16 DIAGNOSIS — J302 Other seasonal allergic rhinitis: Secondary | ICD-10-CM | POA: Diagnosis not present

## 2020-10-16 DIAGNOSIS — E1065 Type 1 diabetes mellitus with hyperglycemia: Secondary | ICD-10-CM | POA: Diagnosis not present

## 2020-10-16 DIAGNOSIS — R030 Elevated blood-pressure reading, without diagnosis of hypertension: Secondary | ICD-10-CM | POA: Diagnosis not present

## 2020-10-16 DIAGNOSIS — E1042 Type 1 diabetes mellitus with diabetic polyneuropathy: Secondary | ICD-10-CM | POA: Diagnosis not present

## 2020-10-16 DIAGNOSIS — E1051 Type 1 diabetes mellitus with diabetic peripheral angiopathy without gangrene: Secondary | ICD-10-CM | POA: Diagnosis not present

## 2020-10-16 DIAGNOSIS — K219 Gastro-esophageal reflux disease without esophagitis: Secondary | ICD-10-CM | POA: Diagnosis not present

## 2020-10-19 DIAGNOSIS — H2513 Age-related nuclear cataract, bilateral: Secondary | ICD-10-CM | POA: Diagnosis not present

## 2020-10-19 DIAGNOSIS — H25013 Cortical age-related cataract, bilateral: Secondary | ICD-10-CM | POA: Diagnosis not present

## 2020-10-22 ENCOUNTER — Other Ambulatory Visit: Payer: Self-pay | Admitting: Family

## 2020-10-22 DIAGNOSIS — G629 Polyneuropathy, unspecified: Secondary | ICD-10-CM

## 2020-10-31 DIAGNOSIS — H25012 Cortical age-related cataract, left eye: Secondary | ICD-10-CM | POA: Diagnosis not present

## 2020-10-31 DIAGNOSIS — H2512 Age-related nuclear cataract, left eye: Secondary | ICD-10-CM | POA: Diagnosis not present

## 2020-10-31 DIAGNOSIS — H2511 Age-related nuclear cataract, right eye: Secondary | ICD-10-CM | POA: Diagnosis not present

## 2020-11-07 DIAGNOSIS — H25012 Cortical age-related cataract, left eye: Secondary | ICD-10-CM | POA: Diagnosis not present

## 2020-11-07 DIAGNOSIS — H2512 Age-related nuclear cataract, left eye: Secondary | ICD-10-CM | POA: Diagnosis not present

## 2020-11-27 ENCOUNTER — Ambulatory Visit: Payer: Self-pay | Admitting: Family

## 2020-12-06 ENCOUNTER — Ambulatory Visit: Payer: Self-pay | Admitting: Internal Medicine

## 2020-12-06 ENCOUNTER — Ambulatory Visit: Payer: Self-pay | Admitting: Family

## 2020-12-12 DIAGNOSIS — Z0100 Encounter for examination of eyes and vision without abnormal findings: Secondary | ICD-10-CM | POA: Diagnosis not present

## 2020-12-12 DIAGNOSIS — H524 Presbyopia: Secondary | ICD-10-CM | POA: Diagnosis not present

## 2021-01-08 ENCOUNTER — Other Ambulatory Visit: Payer: Self-pay

## 2021-01-08 ENCOUNTER — Ambulatory Visit (INDEPENDENT_AMBULATORY_CARE_PROVIDER_SITE_OTHER): Payer: Medicare HMO | Admitting: Family

## 2021-01-08 ENCOUNTER — Encounter: Payer: Self-pay | Admitting: Family

## 2021-01-08 VITALS — BP 120/60 | HR 87 | Temp 97.1°F | Resp 16 | Ht 64.0 in | Wt 155.0 lb

## 2021-01-08 DIAGNOSIS — E1169 Type 2 diabetes mellitus with other specified complication: Secondary | ICD-10-CM | POA: Diagnosis not present

## 2021-01-08 DIAGNOSIS — Z780 Asymptomatic menopausal state: Secondary | ICD-10-CM

## 2021-01-08 DIAGNOSIS — E663 Overweight: Secondary | ICD-10-CM

## 2021-01-08 DIAGNOSIS — I10 Essential (primary) hypertension: Secondary | ICD-10-CM

## 2021-01-08 DIAGNOSIS — E785 Hyperlipidemia, unspecified: Secondary | ICD-10-CM

## 2021-01-08 DIAGNOSIS — Z6826 Body mass index (BMI) 26.0-26.9, adult: Secondary | ICD-10-CM

## 2021-01-08 DIAGNOSIS — G629 Polyneuropathy, unspecified: Secondary | ICD-10-CM | POA: Diagnosis not present

## 2021-01-08 NOTE — Progress Notes (Signed)
Provider: Marlowe Sax FNP-C   Deborahann Poteat, Nelda Bucks, NP  Patient Care Team: Jeremyah Jelley, Nelda Bucks, NP as PCP - General (Family Medicine)  Extended Emergency Contact Information Primary Emergency Contact: Theador Hawthorne [place] Phone: [phone] Mobile Phone: [phone] Relation: Daughter  Code Status:  Full Code  Goals of care: Advanced Directive information Advanced Directives 01/08/2021  Does Patient Have a Medical Advance Directive? Yes  Type of Advance Directive Living will  Does patient want to make changes to medical advance directive? No - Patient declined  Copy of [place] in Chart? -  Would patient like information on creating a medical advance directive? -     Chief Complaint  Patient presents with   Medical Management of Chronic Issues    4 month follow up.   Health Maintenance    Discuss the need for Dexa Scan, and Foot exam.   Immunizations    Discuss the need for Shingrix vaccine, PNA vaccine, and Covid Booster.    HPI:  Pt is a 85 y.o. female seen today for 4 month follow up for medical management of chronic diseases. Status post TAH with bilateral salpingo oophorectomy done 09/11/2020 vaginal bleeding resolved. Denies any abdominal pain.  HTN - no home b/p readings cuff is broken.she loan someone her stethoscope but didn't return.denies any headache,dizziness,vision changes,fatigue,chest tightness,palpitation,chest pain or shortness of breath.      Type 2 DM - CBG running high slightly high in the 130 - 180's has not been eating as she has been.Not eating her veggies.on metformin,trulicity   Status post cataract surgery 12/06/2020.vision has improved.just trying to get used to eye glasses.Has follow up with Dr.Shapiro Ophthalmologist at   Neuropathy - on feet getting better. On Gabapentin.   Due for Dexa scan- will order bone density.No previous bone density for review.  Shingrix vaccine - not covered by her  insurance.  COVID-19 booster - due for 3 rd booster.  Will plan to get PNA vaccine on next visit   Past Medical History:  Diagnosis Date   Allergy    Cancer ([place])    endometrial   Cataract    Diabetes mellitus without complication ([place])    type 2   GERD (gastroesophageal reflux disease)    High cholesterol    Jaundice    as a child   Macular degeneration    Varicose veins of lower extremity    right leg   Past Surgical History:  Procedure Laterality Date   ROBOTIC ASSISTED TOTAL HYSTERECTOMY WITH BILATERAL SALPINGO OOPHERECTOMY Bilateral 09/11/2020   Procedure: XI ROBOTIC ASSISTED TOTAL HYSTERECTOMY WITH BILATERAL SALPINGO OOPHORECTOMY;  Surgeon: Everitt Amber, MD;  Location: [facility];  Service: Gynecology;  Laterality: Bilateral;   SENTINEL NODE BIOPSY N/A 09/11/2020   Procedure: SENTINEL NODE BIOPSY;  Surgeon: Everitt Amber, MD;  Location: [facility];  Service: Gynecology;  Laterality: N/A;   TUBAL LIGATION Bilateral     Allergies  Allergen Reactions   Codeine Other (See Comments)    Causes lip burning and hyperactivity    Allergies as of 01/08/2021       Reactions   Codeine Other (See Comments)   Causes lip burning and hyperactivity        Medication List        Accurate as of January 08, 2021 11:49 AM. If you have any questions, ask your nurse or doctor.          famotidine 10 MG tablet Commonly known as:  PEPCID Take 10 mg by mouth 2 (two) times daily as needed for heartburn or indigestion.   fexofenadine 60 MG tablet Commonly known as: ALLEGRA Take 60 mg by mouth daily.   FreeStyle Libre 14 Day Reader Kerrin Mo 1 Units by Does not apply route 2 (two) times daily. H35.30, E11.65   FreeStyle Libre 14 Day Sensor Misc Apply 1 sensor every 14 days E11.319   gabapentin 100 MG capsule Commonly known as: NEURONTIN TAKE 1 CAPSULE BY MOUTH THREE TIMES A DAY   ICAPS AREDS 2 PO Take 1 capsule by mouth in the morning and at bedtime.   Medical Compression Thigh High  Misc 1 each by Does not apply route daily.   metFORMIN 1000 MG tablet Commonly known as: GLUCOPHAGE Take 1 tablet (1,000 mg total) by mouth 2 (two) times daily with a meal.   rosuvastatin 5 MG tablet Commonly known as: Crestor Take 1 tablet (5 mg total) by mouth daily.   Trulicity 5.63 OV/5.6EP Sopn Generic drug: Dulaglutide INJECT 0.75 MG INTO THE SKIN ONCE A WEEK.        Review of Systems  Constitutional:  Negative for appetite change, chills, fatigue, fever and unexpected weight change.  HENT:  Negative for congestion, dental problem, ear discharge, ear pain, facial swelling, hearing loss, nosebleeds, postnasal drip, rhinorrhea, sinus pressure, sinus pain, sneezing, sore throat, tinnitus and trouble swallowing.   Eyes:  Positive for visual disturbance. Negative for pain, discharge, redness and itching.       Status post Cataract surgery follows up with Dr.shapiro   Respiratory:  Negative for cough, chest tightness, shortness of breath and wheezing.   Cardiovascular:  Negative for chest pain, palpitations and leg swelling.  Gastrointestinal:  Negative for abdominal distention, abdominal pain, blood in stool, constipation, diarrhea, nausea and vomiting.  Endocrine: Negative for cold intolerance, heat intolerance, polydipsia, polyphagia and polyuria.  Genitourinary:  Negative for difficulty urinating, dysuria, flank pain, frequency, urgency, vaginal bleeding and vaginal discharge.  Musculoskeletal:  Negative for arthralgias, back pain, gait problem, joint swelling, myalgias, neck pain and neck stiffness.  Skin:  Negative for color change, pallor, rash and wound.  Neurological:  Negative for dizziness, syncope, speech difficulty, weakness, light-headedness, numbness and headaches.  Hematological:  Does not bruise/bleed easily.  Psychiatric/Behavioral:  Negative for agitation, behavioral problems, confusion, hallucinations, self-injury, sleep disturbance and suicidal ideas. The patient  is not nervous/anxious.    Immunization History  Administered Date(s) Administered   Fluad Quad(high Dose 65+) 04/16/2020   Influenza, High Dose Seasonal PF 06/10/2019   Moderna Sars-Covid-2 Vaccination 08/04/2019, 09/01/2019   Tdap 10/05/2018   Pertinent  Health Maintenance Due  Topic Date Due   DEXA SCAN  Never done   PNA vac Low Risk Adult (1 of 2 - PCV13) Never done   FOOT EXAM  09/12/2020   HEMOGLOBIN A1C  02/12/2021   INFLUENZA VACCINE  02/18/2021   URINE MICROALBUMIN  05/04/2021   OPHTHALMOLOGY EXAM  08/21/2021   Fall Risk  01/08/2021 08/06/2020 07/27/2020 04/16/2020 04/10/2020  Falls in the past year? 0 0 0 - 0  Number falls in past yr: 0 0 0 0 0  Injury with Fall? 0 0 0 0 0   Functional Status Survey:    Vitals:   01/08/21 1137  BP: 120/60  Pulse: 87  Resp: 16  Temp: (!) 97.1 F (36.2 C)  SpO2: 94%  Weight: 155 lb (70.3 kg)  Height: 5' 4"  (1.626 m)   Body mass index is 26.61 kg/m.  Physical Exam Vitals reviewed.  Constitutional:      General: She is not in acute distress.    Appearance: Normal appearance. She is normal weight. She is not ill-appearing or diaphoretic.  HENT:     Head: Normocephalic.     Right Ear: Tympanic membrane, ear canal and external ear normal. There is no impacted cerumen.     Left Ear: Tympanic membrane, ear canal and external ear normal. There is no impacted cerumen.     Nose: Nose normal. No congestion or rhinorrhea.     Mouth/Throat:     Mouth: Mucous membranes are moist.     Pharynx: Oropharynx is clear. No oropharyngeal exudate or posterior oropharyngeal erythema.  Eyes:     General: No scleral icterus.       Right eye: No discharge.        Left eye: No discharge.     Extraocular Movements: Extraocular movements intact.     Conjunctiva/sclera: Conjunctivae normal.     Pupils: Pupils are equal, round, and reactive to light.  Neck:     Vascular: No carotid bruit.  Cardiovascular:     Rate and Rhythm: Normal rate and regular  rhythm.     Pulses: Normal pulses.     Heart sounds: Normal heart sounds. No murmur heard.   No friction rub. No gallop.  Pulmonary:     Effort: Pulmonary effort is normal. No respiratory distress.     Breath sounds: Normal breath sounds. No wheezing, rhonchi or rales.  Chest:     Chest wall: No tenderness.  Abdominal:     General: Bowel sounds are normal. There is no distension.     Palpations: Abdomen is soft. There is no mass.     Tenderness: There is no abdominal tenderness. There is no right CVA tenderness, left CVA tenderness, guarding or rebound.  Musculoskeletal:        General: No swelling or tenderness. Normal range of motion.     Cervical back: Normal range of motion. No rigidity or tenderness.     Right lower leg: No edema.     Left lower leg: No edema.  Lymphadenopathy:     Cervical: No cervical adenopathy.  Skin:    General: Skin is warm and dry.     Coloration: Skin is not pale.     Findings: No bruising, erythema, lesion or rash.  Neurological:     Mental Status: She is alert and oriented to person, place, and time.     Cranial Nerves: No cranial nerve deficit.     Sensory: No sensory deficit.     Motor: No weakness.     Coordination: Coordination normal.     Gait: Gait normal.  Psychiatric:        Mood and Affect: Mood normal.        Speech: Speech normal.        Behavior: Behavior normal.        Thought Content: Thought content normal.        Judgment: Judgment normal.    Labs reviewed: Recent Labs    04/10/20 0947 08/15/20 0948 09/06/20 1143  NA 140 139 139  K 4.6 4.7 4.8  CL 101 100 100  CO2 29 31 29   GLUCOSE 194* 192* 117*  BUN 17 17 16   CREATININE 0.74 0.70 0.63  CALCIUM 9.7 9.5 9.8   Recent Labs    09/06/20 1143  AST 13*  ALT 11  ALKPHOS 38  BILITOT 0.8  PROT 7.7  ALBUMIN 4.2   Recent Labs    07/27/20 1604 08/15/20 0948 09/06/20 1143  WBC 9.7 10.1 11.9*  NEUTROABS 5,675 6,828  --   HGB 14.4 14.7 14.6  HCT 42.5 43.8 44.3   MCV 90.8 90.9 92.5  PLT 385 423* 342   No results found for: TSH Lab Results  Component Value Date   HGBA1C 8.0 (H) 08/15/2020   Lab Results  Component Value Date   CHOL 160 08/15/2020   HDL 40 (L) 08/15/2020   LDLCALC 93 08/15/2020   TRIG 175 (H) 08/15/2020   CHOLHDL 4.0 08/15/2020    Significant Diagnostic Results in last 30 days:  No results found.  Assessment/Plan  1. Essential hypertension B/p well controlled. Off meds  - CBC with Differential/Platelet; Future - CMP with eGFR([facility]); Future  2. Controlled type 2 diabetes mellitus with other specified complication, without long-term current use of insulin (HCC) Lab Results  Component Value Date   HGBA1C 8.0 (H) 08/15/2020  Continue on metformin and Trulicity  On Statin for Cardio protection.Not on ASA due to advance age high risk for GI bleed. - CBC with Differential/Platelet; Future - CMP with eGFR([facility]); Future - Hemoglobin A1c; Future - TSH; Future  3. Hyperlipidemia associated with type 2 diabetes mellitus ([place]) LDL at goal  - continue on rosuvastatin  - Lipid panel; Future  4. Peripheral polyneuropathy Continue on Gabapentin   5. Postmenopausal estrogen deficiency No previous bone density for review. Made aware that [facility] will call her for appointment - DG Bone Density; Future  6. Overweight with body mass index (BMI) 25.0-29.9 BMI 26.61  Dietary modification and exercise as tolerated.  7. Body mass index 26.0-26.9, adult Diet and exercise modification as above.   Family/ staff Communication: Reviewed plan of care with patient verbalized understanding  Labs/tests ordered:  - DG Bone Density; Future - CBC with Differential/Platelet; Future - CMP with eGFR([facility]); Future - Hemoglobin A1c; Future - TSH; Future  Next Appointment : 6 months for medical management of chronic issues.Fasting Labs in 1 week or sooner.    Sandrea Hughs, NP

## 2021-01-11 ENCOUNTER — Other Ambulatory Visit: Payer: Self-pay

## 2021-01-11 ENCOUNTER — Other Ambulatory Visit: Payer: Medicare HMO

## 2021-01-11 DIAGNOSIS — E1169 Type 2 diabetes mellitus with other specified complication: Secondary | ICD-10-CM

## 2021-01-11 DIAGNOSIS — E785 Hyperlipidemia, unspecified: Secondary | ICD-10-CM

## 2021-01-11 DIAGNOSIS — I10 Essential (primary) hypertension: Secondary | ICD-10-CM | POA: Diagnosis not present

## 2021-01-12 LAB — LIPID PANEL
Cholesterol: 118 mg/dL (ref <200)
HDL: 43 mg/dL — ABNORMAL LOW (ref >=50)
LDL Cholesterol (Calc): 51 mg/dL (calc)
Non-HDL Cholesterol (Calc): 75 mg/dL (calc) (ref <130)
Total CHOL/HDL Ratio: 2.7 (calc) (ref <5.0)
Triglycerides: 162 mg/dL — ABNORMAL HIGH (ref <150)

## 2021-01-12 LAB — CBC WITH DIFFERENTIAL/PLATELET
Absolute Monocytes: 708 cells/uL (ref 200–950)
Basophils Absolute: 39 cells/uL (ref 0–200)
Basophils Relative: 0.4 %
Eosinophils Absolute: 78 cells/uL (ref 15–500)
Eosinophils Relative: 0.8 %
HCT: 42.2 % (ref 35.0–45.0)
Hemoglobin: 13.8 g/dL (ref 11.7–15.5)
Lymphs Abs: 2522 cells/uL (ref 850–3900)
MCH: 30.3 pg (ref 27.0–33.0)
MCHC: 32.7 g/dL (ref 32.0–36.0)
MCV: 92.7 fL (ref 80.0–100.0)
MPV: 10.6 fL (ref 7.5–12.5)
Monocytes Relative: 7.3 %
Neutro Abs: 6354 cells/uL (ref 1500–7800)
Neutrophils Relative %: 65.5 %
Platelets: 331 10*3/uL (ref 140–400)
RBC: 4.55 10*6/uL (ref 3.80–5.10)
RDW: 12.4 % (ref 11.0–15.0)
Total Lymphocyte: 26 %
WBC: 9.7 10*3/uL (ref 3.8–10.8)

## 2021-01-12 LAB — COMPLETE METABOLIC PANEL WITH GFR
AG Ratio: 1.5 (calc) (ref 1.0–2.5)
ALT: 9 U/L (ref 6–29)
AST: 11 U/L (ref 10–35)
Albumin: 4 g/dL (ref 3.6–5.1)
Alkaline phosphatase (APISO): 41 U/L (ref 37–153)
BUN: 17 mg/dL (ref 7–25)
CO2: 29 mmol/L (ref 20–32)
Calcium: 9.6 mg/dL (ref 8.6–10.4)
Chloride: 103 mmol/L (ref 98–110)
Creat: 0.71 mg/dL (ref 0.60–0.88)
GFR, Est African American: 92 mL/min/{1.73_m2} (ref >=60)
GFR, Est Non African American: 79 mL/min/{1.73_m2} (ref >=60)
Globulin: 2.6 g/dL (calc) (ref 1.9–3.7)
Glucose, Bld: 171 mg/dL — ABNORMAL HIGH (ref 65–99)
Potassium: 4.6 mmol/L (ref 3.5–5.3)
Sodium: 140 mmol/L (ref 135–146)
Total Bilirubin: 0.4 mg/dL (ref 0.2–1.2)
Total Protein: 6.6 g/dL (ref 6.1–8.1)

## 2021-01-12 LAB — HEMOGLOBIN A1C
Hgb A1c MFr Bld: 8 % of total Hgb — ABNORMAL HIGH (ref <5.7)
Mean Plasma Glucose: 183 mg/dL
eAG (mmol/L): 10.1 mmol/L

## 2021-01-12 LAB — TSH: TSH: 2.32 mIU/L (ref 0.40–4.50)

## 2021-01-15 ENCOUNTER — Other Ambulatory Visit: Payer: Self-pay | Admitting: Family

## 2021-01-15 DIAGNOSIS — E1169 Type 2 diabetes mellitus with other specified complication: Secondary | ICD-10-CM

## 2021-01-15 DIAGNOSIS — E785 Hyperlipidemia, unspecified: Secondary | ICD-10-CM

## 2021-01-15 DIAGNOSIS — I10 Essential (primary) hypertension: Secondary | ICD-10-CM

## 2021-01-15 NOTE — Progress Notes (Signed)
Future Labs ordered

## 2021-01-26 ENCOUNTER — Other Ambulatory Visit: Payer: Self-pay | Admitting: Gynecologic Oncology

## 2021-01-26 DIAGNOSIS — C541 Malignant neoplasm of endometrium: Secondary | ICD-10-CM

## 2021-02-15 ENCOUNTER — Other Ambulatory Visit: Payer: Self-pay | Admitting: *Deleted

## 2021-02-15 MED ORDER — ROSUVASTATIN CALCIUM 5 MG PO TABS: 5.0000 mg | ORAL_TABLET | Freq: Every day | ORAL | 1 refills | Status: DC

## 2021-02-15 NOTE — Telephone Encounter (Signed)
Pharmacy requested refill

## 2021-02-20 ENCOUNTER — Other Ambulatory Visit: Payer: Self-pay | Admitting: *Deleted

## 2021-02-20 DIAGNOSIS — E11319 Type 2 diabetes mellitus with unspecified diabetic retinopathy without macular edema: Secondary | ICD-10-CM

## 2021-02-20 MED ORDER — TRULICITY 0.75 MG/0.5ML ~~LOC~~ SOAJ: 0.7500 mg | SUBCUTANEOUS | 2 refills | Status: DC

## 2021-02-20 NOTE — Telephone Encounter (Signed)
[facility] [address] 

## 2021-03-06 ENCOUNTER — Encounter (INDEPENDENT_AMBULATORY_CARE_PROVIDER_SITE_OTHER): Payer: Medicare HMO | Admitting: Ophthalmology

## 2021-03-21 ENCOUNTER — Encounter (INDEPENDENT_AMBULATORY_CARE_PROVIDER_SITE_OTHER): Payer: Self-pay | Admitting: Ophthalmology

## 2021-03-21 ENCOUNTER — Other Ambulatory Visit: Payer: Self-pay

## 2021-03-21 ENCOUNTER — Ambulatory Visit (INDEPENDENT_AMBULATORY_CARE_PROVIDER_SITE_OTHER): Payer: Medicare HMO | Admitting: Ophthalmology

## 2021-03-21 DIAGNOSIS — E119 Type 2 diabetes mellitus without complications: Secondary | ICD-10-CM

## 2021-03-21 DIAGNOSIS — H353123 Nonexudative age-related macular degeneration, left eye, advanced atrophic without subfoveal involvement: Secondary | ICD-10-CM | POA: Diagnosis not present

## 2021-03-21 DIAGNOSIS — Z961 Presence of intraocular lens: Secondary | ICD-10-CM | POA: Insufficient documentation

## 2021-03-21 DIAGNOSIS — H353114 Nonexudative age-related macular degeneration, right eye, advanced atrophic with subfoveal involvement: Secondary | ICD-10-CM | POA: Diagnosis not present

## 2021-03-21 NOTE — Progress Notes (Signed)
03/21/2021     CHIEF COMPLAINT Patient presents for  Chief Complaint  Patient presents with   Retina Follow Up      HISTORY OF PRESENT ILLNESS: Barbara Golden is a 85 y.o. female who presents to the clinic today for:   HPI     Retina Follow Up   Patient presents with  Dry AMD.  In both eyes.  This started 6 months ago.  Severity is mild.  Duration of 6 months.        Comments   6 mos fu ou oct "I had cataract sx in June, the left is good but the right is not as good. It is harder for me to read up close. I have to be in bright light in order to read." Pt glasses prescription is only 12 weeks old. Denies new FOL, floaters. LBS: 187 after breakfast A1C: 8.0 on 01/11/2021       Last edited by Laurin Coder on 03/21/2021  2:25 PM.      Referring physician: Rutherford Guys, [place],  [place] [id number]  HISTORICAL INFORMATION:   Selected notes from the MEDICAL RECORD NUMBER    Lab Results  Component Value Date   HGBA1C 8.0 (H) 01/11/2021     CURRENT MEDICATIONS: No current outpatient medications on file. (Ophthalmic Drugs)   No current facility-administered medications for this visit. (Ophthalmic Drugs)   Current Outpatient Medications (Other)  Medication Sig   Continuous Blood Gluc Receiver (FREESTYLE LIBRE 14 DAY READER) DEVI 1 Units by Does not apply route 2 (two) times daily. H35.30, E11.65   Continuous Blood Gluc Sensor (FREESTYLE LIBRE 14 DAY SENSOR) MISC Apply 1 sensor every 14 days E11.319   Dulaglutide (TRULICITY) [id number] [id number] SOPN Inject 0.75 mg into the skin once a week.   Elastic Bandages & Supports (MEDICAL COMPRESSION THIGH HIGH) MISC 1 each by Does not apply route daily.   famotidine (PEPCID) 10 MG tablet Take 10 mg by mouth 2 (two) times daily as needed for heartburn or indigestion.   fexofenadine (ALLEGRA) 60 MG tablet Take 60 mg by mouth daily.   gabapentin (NEURONTIN) 100 MG capsule TAKE 1 CAPSULE BY MOUTH THREE TIMES A  DAY   metFORMIN (GLUCOPHAGE) 1000 MG tablet Take 1 tablet (1,000 mg total) by mouth 2 (two) times daily with a meal.   Multiple Vitamins-Minerals (ICAPS AREDS 2 PO) Take 1 capsule by mouth in the morning and at bedtime.   rosuvastatin (CRESTOR) 5 MG tablet Take 1 tablet (5 mg total) by mouth daily.   No current facility-administered medications for this visit. (Other)      REVIEW OF SYSTEMS:    ALLERGIES Allergies  Allergen Reactions   Codeine Other (See Comments)    Causes lip burning and hyperactivity    PAST MEDICAL HISTORY Past Medical History:  Diagnosis Date   Allergy    Cancer ([facility])    endometrial   Cataract    Diabetes mellitus without complication ([facility])    type 2   GERD (gastroesophageal reflux disease)    High cholesterol    Jaundice    as a child   Macular degeneration    Nuclear sclerotic cataract of both eyes 09/05/2020   Varicose veins of lower extremity    right leg   Past Surgical History:  Procedure Laterality Date   ROBOTIC ASSISTED TOTAL HYSTERECTOMY WITH BILATERAL SALPINGO OOPHERECTOMY Bilateral 09/11/2020   Procedure: XI ROBOTIC ASSISTED TOTAL HYSTERECTOMY WITH BILATERAL SALPINGO  OOPHORECTOMY;  Surgeon: Everitt Amber, MD;  Location: [facility];  Service: Gynecology;  Laterality: Bilateral;   SENTINEL NODE BIOPSY N/A 09/11/2020   Procedure: SENTINEL NODE BIOPSY;  Surgeon: Everitt Amber, MD;  Location: [facility];  Service: Gynecology;  Laterality: N/A;   TUBAL LIGATION Bilateral     FAMILY HISTORY Family History  Problem Relation Age of Onset   Cancer Mother        colon/stomach   Heart disease Father    Cancer Nephew        bile duct    SOCIAL HISTORY Social History   Tobacco Use   Smoking status: Former    Packs/day: 0.50    Types: Cigarettes    Quit date: 03/26/2009    Years since quitting: 11.9   Smokeless tobacco: Never  Vaping Use   Vaping Use: Never used  Substance Use Topics   Alcohol use: No   Drug use: No         OPHTHALMIC  EXAM:  Base Eye Exam     Visual Acuity (ETDRS)       Right Left   Dist cc 20/60 -1 20/25   Dist ph cc NI     Correction: Glasses         Tonometry (Tonopen, 2:31 PM)       Right Left   Pressure 10 9         Pupils       Pupils Dark Light APD   Right PERRL 5 3 None   Left PERRL 5 3 None         Neuro/Psych     Oriented x3: Yes   Mood/Affect: Normal         Dilation     Both eyes: 1.0% Mydriacyl, 2.5% Phenylephrine @ 2:31 PM           Slit Lamp and Fundus Exam     External Exam       Right Left   External Normal Normal         Slit Lamp Exam       Right Left   Lids/Lashes Normal Normal   Conjunctiva/Sclera White and quiet White and quiet   Cornea Clear Clear   Anterior Chamber Deep and quiet Deep and quiet   Iris Round and reactive Round and reactive   Lens Centered posterior chamber intraocular lens Centered posterior chamber intraocular lens   Anterior Vitreous Normal Normal         Fundus Exam       Right Left   Posterior Vitreous Posterior vitreous detachment Posterior vitreous detachment   Disc thin rim INF thin rim INF, Peripapillary atrophy   C/D Ratio 0.5 0.6   Macula Geographic atrophy, pigment in the FAZ, no macular thickening, no exudates, no hemorrhage only a small island of foveal pigment remains Geographic atrophy, pigment in the FAZ, no macular thickening, no exudates, no hemorrhage only a small island of foveal pigment remains   Vessels Normal, no DR Normal, no DR   Periphery Reticular degeneration Reticular degeneration            IMAGING AND PROCEDURES  Imaging and Procedures for 03/21/21  OCT, Retina - OU - Both Eyes       Right Eye Quality was good. Scan locations included subfoveal. Central Foveal Thickness: 219. Progression has been stable. Findings include abnormal foveal contour, inner retinal atrophy, outer retinal atrophy.   Left Eye Quality was good. Scan locations included subfoveal. Central  Foveal Thickness: 237. Progression has been stable. Findings include abnormal foveal contour, outer retinal atrophy, inner retinal atrophy.   Notes Outer retinal atrophy OU, no CNVM OU.  With significant loss of the photoreceptor layer and ellipsoid zone in the macular region right eye subfoveal and left eye perifoveal  Incidental punctual posterior vitreous detachment low-lying left eye, and PVD right eye              ASSESSMENT/PLAN:  Diabetes mellitus without complication (HCC) No detectable diabetic retinopathy  Advanced nonexudative age-related macular degeneration of right eye with subfoveal involvement Progressive subtle vision loss yet still with fine acuity as small island of pigment remains in the fovea  Advanced nonexudative age-related macular degeneration of left eye without subfoveal involvement Progressive subtle vision loss yet still with fine acuity as small island of pigment remains in the fovea  OU with low risk of progression to wet AMD  Pseudophakia, both eyes Stable     ICD-10-CM   1. Advanced nonexudative age-related macular degeneration of right eye with subfoveal involvement  H35.3114 OCT, Retina - OU - Both Eyes    2. Advanced nonexudative age-related macular degeneration of left eye without subfoveal involvement  H35.3123 OCT, Retina - OU - Both Eyes    3. Diabetes mellitus without complication ([facility])  [id number]     4. Pseudophakia, both eyes  Z96.1       1.  Bilateral geographic atrophy surrounds the foveal island each eye.  No signs of CNVM.  We will continue to observe.  2.  Both eyes look great after cataract surgery Dr. Rutherford Guys since last visit here  3.  Ophthalmic Meds Ordered this visit:  No orders of the defined types were placed in this encounter.      Return in about 9 months (around 12/19/2021) for DILATE OU, COLOR FP, OCT.  There are no Patient Instructions on file for this visit.   Explained the diagnoses, plan, and  follow up with the patient and they expressed understanding.  Patient expressed understanding of the importance of proper follow up care.   Clent Demark Hayato Guaman M.D. Diseases & Surgery of the Retina and Vitreous [facility] [place] 03/21/21     Abbreviations: M myopia (nearsighted); A astigmatism; H hyperopia (farsighted); P presbyopia; Mrx spectacle prescription;  CTL contact lenses; OD right eye; OS left eye; OU both eyes  XT exotropia; ET esotropia; PEK punctate epithelial keratitis; PEE punctate epithelial erosions; DES dry eye syndrome; MGD meibomian gland dysfunction; ATs artificial tears; PFAT's preservative free artificial tears; [place] nuclear sclerotic cataract; PSC posterior subcapsular cataract; ERM epi-retinal membrane; PVD posterior vitreous detachment; RD retinal detachment; DM diabetes mellitus; DR diabetic retinopathy; NPDR non-proliferative diabetic retinopathy; PDR proliferative diabetic retinopathy; CSME clinically significant macular edema; DME diabetic macular edema; dbh dot blot hemorrhages; CWS cotton wool spot; POAG primary open angle glaucoma; C/D cup-to-disc ratio; HVF humphrey visual field; GVF goldmann visual field; OCT optical coherence tomography; IOP intraocular pressure; BRVO Branch retinal vein occlusion; CRVO central retinal vein occlusion; CRAO central retinal artery occlusion; BRAO branch retinal artery occlusion; RT retinal tear; SB scleral buckle; PPV pars plana vitrectomy; VH Vitreous hemorrhage; PRP panretinal laser photocoagulation; IVK intravitreal kenalog; VMT vitreomacular traction; MH Macular hole;  NVD neovascularization of the disc; NVE neovascularization elsewhere; AREDS age related eye disease study; ARMD age related macular degeneration; POAG primary open angle glaucoma; EBMD epithelial/anterior basement membrane dystrophy; ACIOL anterior chamber intraocular lens; IOL intraocular lens; PCIOL posterior chamber intraocular lens; Phaco/IOL  phacoemulsification with intraocular lens placement; [place] photorefractive keratectomy; LASIK laser assisted in situ keratomileusis; HTN hypertension; DM diabetes mellitus; COPD chronic obstructive pulmonary disease

## 2021-03-21 NOTE — Assessment & Plan Note (Signed)
No detectable diabetic retinopathy 

## 2021-03-21 NOTE — Assessment & Plan Note (Signed)
Stable

## 2021-03-21 NOTE — Assessment & Plan Note (Signed)
Progressive subtle vision loss yet still with fine acuity as small island of pigment remains in the fovea  OU with low risk of progression to wet AMD

## 2021-03-21 NOTE — Assessment & Plan Note (Signed)
Progressive subtle vision loss yet still with fine acuity as small island of pigment remains in the fovea

## 2021-04-15 ENCOUNTER — Other Ambulatory Visit: Payer: Self-pay | Admitting: Family

## 2021-04-15 DIAGNOSIS — G629 Polyneuropathy, unspecified: Secondary | ICD-10-CM

## 2021-05-20 ENCOUNTER — Other Ambulatory Visit: Payer: Self-pay | Admitting: *Deleted

## 2021-05-20 DIAGNOSIS — H3530 Unspecified macular degeneration: Secondary | ICD-10-CM

## 2021-05-20 DIAGNOSIS — E1165 Type 2 diabetes mellitus with hyperglycemia: Secondary | ICD-10-CM

## 2021-05-20 MED ORDER — FREESTYLE LIBRE 14 DAY SENSOR MISC: 11 refills | Status: DC

## 2021-05-20 NOTE — Telephone Encounter (Signed)
Pharmacy requested refill

## 2021-06-24 DIAGNOSIS — Z961 Presence of intraocular lens: Secondary | ICD-10-CM | POA: Diagnosis not present

## 2021-06-24 DIAGNOSIS — H353132 Nonexudative age-related macular degeneration, bilateral, intermediate dry stage: Secondary | ICD-10-CM | POA: Diagnosis not present

## 2021-07-08 ENCOUNTER — Encounter: Payer: Self-pay | Admitting: Orthopedic Surgery

## 2021-07-08 ENCOUNTER — Ambulatory Visit (INDEPENDENT_AMBULATORY_CARE_PROVIDER_SITE_OTHER): Payer: Medicare HMO | Admitting: Orthopedic Surgery

## 2021-07-08 ENCOUNTER — Other Ambulatory Visit: Payer: Self-pay

## 2021-07-08 VITALS — BP 132/80 | HR 96 | Temp 98.6°F | Resp 16 | Ht 64.0 in

## 2021-07-08 DIAGNOSIS — R0989 Other specified symptoms and signs involving the circulatory and respiratory systems: Secondary | ICD-10-CM | POA: Diagnosis not present

## 2021-07-08 DIAGNOSIS — R067 Sneezing: Secondary | ICD-10-CM | POA: Diagnosis not present

## 2021-07-08 DIAGNOSIS — R059 Cough, unspecified: Secondary | ICD-10-CM

## 2021-07-08 LAB — POCT INFLUENZA A/B
Influenza A, POC: NEGATIVE
Influenza B, POC: NEGATIVE

## 2021-07-08 MED ORDER — PREDNISONE 10 MG PO TABS: ORAL_TABLET | ORAL | 0 refills | Status: AC

## 2021-07-08 MED ORDER — ALBUTEROL SULFATE HFA 108 (90 BASE) MCG/ACT IN AERS: 2.0000 | INHALATION_SPRAY | Freq: Four times a day (QID) | RESPIRATORY_TRACT | 0 refills | Status: DC | PRN

## 2021-07-08 NOTE — Progress Notes (Signed)
Careteam: Patient Care Team: Ngetich, Nelda Bucks, NP as PCP - General (Family Medicine)  Seen by: Windell Moulding, AGNP-C  PLACE OF SERVICE:  [place] Directive information Does Patient Have a Medical Advance Directive?: Yes, Type of Advance Directive: Living will, Does patient want to make changes to medical advance directive?: No - Patient declined  Allergies  Allergen Reactions   Codeine Other (See Comments)    Causes lip burning and hyperactivity    Chief Complaint  Patient presents with   Acute Visit    Patient complains of bad cough, sneezing, and runny nose. Patient symptoms started 3 days ago.      HPI: Patient is a 85 y.o. female seen today for acute visit due to cough.   Symptoms of sneezing and runny nose began 12/17. 12/18 she woke up with a cough. Describes cough as wet, having trouble coughing up sputum. Sputum tan. She has not tried any medications or remedies for symptoms. Denies testing for covid. She is not up to date on flu vaccine. She does have mucinex at home, but has not taken any. Reports having bronchitis many times in the past, and believes this is what she has. Also reports past smoking history from age 89 to about 10 years ago. Denies fever, body aches, sob, and malaise. Denies coming into contact with sick persons.   Review of Systems:  Review of Systems  Constitutional:  Negative for chills, fever, malaise/fatigue and weight loss.  HENT:  Positive for congestion and sore throat.   Eyes:  Negative for redness.  Respiratory:  Positive for cough and sputum production. Negative for shortness of breath and wheezing.   Cardiovascular:  Negative for chest pain and leg swelling.  Gastrointestinal:  Negative for diarrhea, nausea and vomiting.  Musculoskeletal:  Negative for falls and myalgias.  Neurological:  Negative for dizziness, weakness and headaches.  Psychiatric/Behavioral:  Negative for depression. The patient is not nervous/anxious.     Past Medical History:  Diagnosis Date   Allergy    Cancer ([facility])    endometrial   Cataract    Diabetes mellitus without complication ([facility])    type 2   GERD (gastroesophageal reflux disease)    High cholesterol    Jaundice    as a child   Macular degeneration    Nuclear sclerotic cataract of both eyes 09/05/2020   Varicose veins of lower extremity    right leg   Past Surgical History:  Procedure Laterality Date   ROBOTIC ASSISTED TOTAL HYSTERECTOMY WITH BILATERAL SALPINGO OOPHERECTOMY Bilateral 09/11/2020   Procedure: XI ROBOTIC ASSISTED TOTAL HYSTERECTOMY WITH BILATERAL SALPINGO OOPHORECTOMY;  Surgeon: Everitt Amber, MD;  Location: [facility];  Service: Gynecology;  Laterality: Bilateral;   SENTINEL NODE BIOPSY N/A 09/11/2020   Procedure: SENTINEL NODE BIOPSY;  Surgeon: Everitt Amber, MD;  Location: [facility];  Service: Gynecology;  Laterality: N/A;   TUBAL LIGATION Bilateral    Social History:   reports that she quit smoking about 12 years ago. Her smoking use included cigarettes. She smoked an average of .5 packs per day. She has never used smokeless tobacco. She reports that she does not drink alcohol and does not use drugs.  Family History  Problem Relation Age of Onset   Cancer Mother        colon/stomach   Heart disease Father    Cancer Nephew        bile duct    Medications: Patient's Medications  New Prescriptions  No medications on file  Previous Medications   CONTINUOUS BLOOD GLUC RECEIVER (FREESTYLE LIBRE 14 DAY READER) DEVI    1 Units by Does not apply route 2 (two) times daily. H35.30, E11.65   CONTINUOUS BLOOD GLUC SENSOR (FREESTYLE LIBRE 14 DAY SENSOR) MISC    Apply 1 sensor every 14 days E11.319   DULAGLUTIDE (TRULICITY) 0.34 VQ/2.5ZD SOPN    Inject 0.75 mg into the skin once a week.   ELASTIC BANDAGES & SUPPORTS (MEDICAL COMPRESSION THIGH HIGH) MISC    1 each by Does not apply route daily.   FAMOTIDINE (PEPCID) 10 MG TABLET    Take 10 mg by mouth 2 (two) times  daily as needed for heartburn or indigestion.   FEXOFENADINE (ALLEGRA) 60 MG TABLET    Take 60 mg by mouth daily.   GABAPENTIN (NEURONTIN) 100 MG CAPSULE    TAKE 1 CAPSULE BY MOUTH THREE TIMES A DAY   METFORMIN (GLUCOPHAGE) 1000 MG TABLET    Take 1 tablet (1,000 mg total) by mouth 2 (two) times daily with a meal.   ROSUVASTATIN (CRESTOR) 5 MG TABLET    Take 1 tablet (5 mg total) by mouth daily.  Modified Medications   No medications on file  Discontinued Medications   MULTIPLE VITAMINS-MINERALS (ICAPS AREDS 2 PO)    Take 1 capsule by mouth in the morning and at bedtime.    Physical Exam:  Vitals:   07/08/21 1529  BP: 132/80  Pulse: 96  Resp: 16  Temp: 98.6 F (37 C)  SpO2: 96%  Height: 5\' 4"  (1.626 m)   Body mass index is 26.61 kg/m. Wt Readings from Last 3 Encounters:  01/08/21 155 lb (70.3 kg)  10/01/20 146 lb (66.2 kg)  09/06/20 147 lb (66.7 kg)    Physical Exam Vitals reviewed.  Constitutional:      General: She is not in acute distress. HENT:     Head: Normocephalic.     Right Ear: There is no impacted cerumen.     Left Ear: There is no impacted cerumen.     Nose: Nose normal. No congestion.     Mouth/Throat:     Mouth: Mucous membranes are moist.     Pharynx: No oropharyngeal exudate or posterior oropharyngeal erythema.  Eyes:     General:        Right eye: No discharge.        Left eye: No discharge.     Extraocular Movements: Extraocular movements intact.     Pupils: Pupils are equal, round, and reactive to light.  Neck:     Vascular: No carotid bruit.  Cardiovascular:     Rate and Rhythm: Normal rate and regular rhythm.     Pulses: Normal pulses.     Heart sounds: Normal heart sounds. No murmur heard. Pulmonary:     Effort: Pulmonary effort is normal. No respiratory distress.     Breath sounds: Examination of the right-upper field reveals wheezing. Examination of the left-upper field reveals wheezing. Wheezing present. No decreased breath sounds or  rhonchi.  Abdominal:     General: Bowel sounds are normal. There is no distension.     Palpations: Abdomen is soft.     Tenderness: There is no abdominal tenderness.  Musculoskeletal:     Cervical back: Normal range of motion.     Right lower leg: No edema.     Left lower leg: No edema.  Lymphadenopathy:     Cervical: No cervical adenopathy.  Skin:  General: Skin is warm and dry.     Capillary Refill: Capillary refill takes less than 2 seconds.  Neurological:     General: No focal deficit present.     Mental Status: She is alert and oriented to person, place, and time.  Psychiatric:        Mood and Affect: Mood normal.        Behavior: Behavior normal.    Labs reviewed: Basic Metabolic Panel: Recent Labs    08/15/20 0948 09/06/20 1143 01/11/21 0903  NA 139 139 140  K 4.7 4.8 4.6  CL 100 100 103  CO2 31 29 29   GLUCOSE 192* 117* 171*  BUN 17 16 17   CREATININE 0.70 0.63 0.71  CALCIUM 9.5 9.8 9.6  TSH  --   --  2.32   Liver Function Tests: Recent Labs    09/06/20 1143 01/11/21 0903  AST 13* 11  ALT 11 9  ALKPHOS 38  --   BILITOT 0.8 0.4  PROT 7.7 6.6  ALBUMIN 4.2  --    No results for input(s): LIPASE, AMYLASE in the last 8760 hours. No results for input(s): AMMONIA in the last 8760 hours. CBC: Recent Labs    07/27/20 1604 08/15/20 0948 09/06/20 1143 01/11/21 0903  WBC 9.7 10.1 11.9* 9.7  NEUTROABS 5,675 6,828  --  6,354  HGB 14.4 14.7 14.6 13.8  HCT 42.5 43.8 44.3 42.2  MCV 90.8 90.9 92.5 92.7  PLT 385 423* 342 331   Lipid Panel: Recent Labs    08/15/20 0945 01/11/21 0903  CHOL 160 118  HDL 40* 43*  LDLCALC 93 51  TRIG 175* 162*  CHOLHDL 4.0 2.7   TSH: Recent Labs    01/11/21 0903  TSH 2.32   A1C: Lab Results  Component Value Date   HGBA1C 8.0 (H) 01/11/2021     Assessment/Plan 1. Cough, unspecified type - productive cough at times, tan sputum - wheezing to upper lobes on exam - ? Chronic bronchitis d/t prolonged smoking   - POC Influenza A/B- negative - SARS-COV-2 RNA,(COVID-19) QUAL NAAT- pending - start mucinex 600 mg po bid for sputum - predniSONE (DELTASONE) 10 MG tablet; Take 4 tablets (40 mg total) by mouth daily with breakfast for 1 day, THEN 3 tablets (30 mg total) daily with breakfast for 1 day, THEN 2 tablets (20 mg total) daily with breakfast for 1 day, THEN 1 tablet (10 mg total) daily with breakfast for 1 day, THEN 0.5 tablets (5 mg total) daily with breakfast for 1 day.  Dispense: 10.5 tablet; Refill: 0 - albuterol (VENTOLIN HFA) 108 (90 Base) MCG/ACT inhaler; Inhale 2 puffs into the lungs every 6 (six) hours as needed for wheezing or shortness of breath.  Dispense: 8 g; Refill: 0  2. Runny nose - exam unremarkable - POC Influenza A/B- negative - SARS-COV-2 RNA,(COVID-19) QUAL NAAT- pending  Total time: 21 minutes. Greater than 50% of total time spent doing patient education regarding symptom/medication management regarding cough.   Next appt: none Sonal Dorwart [place], [facility] [phone]

## 2021-07-08 NOTE — Patient Instructions (Addendum)
Contact provider if your cough worsens of does not subside after 10 days  Continue mucinex 600 mg- twice daily  Hot steamy showers

## 2021-07-09 LAB — SARS-COV-2 RNA,(COVID-19) QUALITATIVE NAAT: SARS CoV2 RNA: NOT DETECTED

## 2021-07-10 ENCOUNTER — Other Ambulatory Visit: Payer: Self-pay

## 2021-07-12 ENCOUNTER — Ambulatory Visit: Payer: Medicare HMO | Admitting: Family

## 2021-07-16 ENCOUNTER — Ambulatory Visit
Admission: RE | Admit: 2021-07-16 | Discharge: 2021-07-16 | Disposition: A | Discharge status: Home / Self Care | Payer: Medicare HMO | Source: Ambulatory Visit | Attending: Nurse Practitioner | Admitting: Nurse Practitioner

## 2021-07-16 ENCOUNTER — Encounter: Payer: Self-pay | Admitting: Nurse Practitioner

## 2021-07-16 ENCOUNTER — Other Ambulatory Visit: Payer: Self-pay

## 2021-07-16 ENCOUNTER — Ambulatory Visit (INDEPENDENT_AMBULATORY_CARE_PROVIDER_SITE_OTHER): Payer: Medicare HMO | Admitting: Nurse Practitioner

## 2021-07-16 VITALS — BP 150/80 | HR 91 | Temp 97.5°F | Ht 64.0 in

## 2021-07-16 DIAGNOSIS — R053 Chronic cough: Secondary | ICD-10-CM

## 2021-07-16 DIAGNOSIS — R0981 Nasal congestion: Secondary | ICD-10-CM | POA: Diagnosis not present

## 2021-07-16 DIAGNOSIS — R059 Cough, unspecified: Secondary | ICD-10-CM | POA: Diagnosis not present

## 2021-07-16 NOTE — Progress Notes (Signed)
Careteam: Patient Care Team: Ngetich, Nelda Bucks, NP as PCP - General (Family Medicine)  PLACE OF SERVICE:  [place] Directive information    Allergies  Allergen Reactions   Codeine Other (See Comments)    Causes lip burning and hyperactivity    Chief Complaint  Patient presents with   Acute Visit    Patient has bad cough. Coughing,but nothing seems to come up. Patient blew nose and it was green. Patient has had cough for about a week. Patient finished Prednisone and is taking Mucinex 600mg  every 12 hours. She has been using inhaler. Patient does not feel short of breath. Feeling really fatigued     HPI: Patient is a 85 y.o. female for persistent cough.  She was prescribed prednisone and mucinex. Reports there was no improvement with prednisone.  Reports cough is getting worse.  No hx of COPD- previous smoker.  Cough is productive. Clear generally Yesterday had color today it is clear.  Reports she is feeling fatigue. She is sleeping even though she is coughing  No body aches.  Able to maintain proper hydration.  Does not feel sick.   She has a cat that stays in her room. Lots of cat dander and dust per daughter.  She lives with her daughter who smokes.   Review of Systems:  Review of Systems  Constitutional:  Negative for chills, fever, malaise/fatigue and weight loss.  HENT:  Positive for congestion. Negative for hearing loss and tinnitus.   Respiratory:  Positive for cough and sputum production. Negative for shortness of breath and wheezing.   Cardiovascular:  Negative for chest pain, palpitations and leg swelling.  Gastrointestinal:  Negative for abdominal pain, constipation, diarrhea and heartburn.  Genitourinary:  Negative for dysuria, frequency and urgency.  Musculoskeletal:  Negative for back pain, falls, joint pain and myalgias.  Skin: Negative.   Neurological:  Negative for dizziness and headaches.  Psychiatric/Behavioral:  Negative for  depression and memory loss. The patient does not have insomnia.    Past Medical History:  Diagnosis Date   Allergy    Cancer ([place])    endometrial   Cataract    Diabetes mellitus without complication ([place])    type 2   GERD (gastroesophageal reflux disease)    High cholesterol    Jaundice    as a child   Macular degeneration    Nuclear sclerotic cataract of both eyes 09/05/2020   Varicose veins of lower extremity    right leg   Past Surgical History:  Procedure Laterality Date   ROBOTIC ASSISTED TOTAL HYSTERECTOMY WITH BILATERAL SALPINGO OOPHERECTOMY Bilateral 09/11/2020   Procedure: XI ROBOTIC ASSISTED TOTAL HYSTERECTOMY WITH BILATERAL SALPINGO OOPHORECTOMY;  Surgeon: Everitt Amber, MD;  Location: [facility];  Service: Gynecology;  Laterality: Bilateral;   SENTINEL NODE BIOPSY N/A 09/11/2020   Procedure: SENTINEL NODE BIOPSY;  Surgeon: Everitt Amber, MD;  Location: [facility];  Service: Gynecology;  Laterality: N/A;   TUBAL LIGATION Bilateral    Social History:   reports that she quit smoking about 12 years ago. Her smoking use included cigarettes. She smoked an average of .5 packs per day. She has never used smokeless tobacco. She reports that she does not drink alcohol and does not use drugs.  Family History  Problem Relation Age of Onset   Cancer Mother        colon/stomach   Heart disease Father    Cancer Nephew        bile duct  Medications: Patient's Medications  New Prescriptions   No medications on file  Previous Medications   ALBUTEROL (VENTOLIN HFA) 108 (90 BASE) MCG/ACT INHALER    Inhale 2 puffs into the lungs every 6 (six) hours as needed for wheezing or shortness of breath.   CONTINUOUS BLOOD GLUC RECEIVER (FREESTYLE LIBRE 14 DAY READER) DEVI    1 Units by Does not apply route 2 (two) times daily. H35.30, E11.65   CONTINUOUS BLOOD GLUC SENSOR (FREESTYLE LIBRE 14 DAY SENSOR) MISC    Apply 1 sensor every 14 days E11.319   DULAGLUTIDE (TRULICITY) 4.23 NT/6.1WE SOPN     Inject 0.75 mg into the skin once a week.   ELASTIC BANDAGES & SUPPORTS (MEDICAL COMPRESSION THIGH HIGH) MISC    1 each by Does not apply route daily.   FAMOTIDINE (PEPCID) 10 MG TABLET    Take 10 mg by mouth 2 (two) times daily as needed for heartburn or indigestion.   FEXOFENADINE (ALLEGRA) 60 MG TABLET    Take 60 mg by mouth daily.   GABAPENTIN (NEURONTIN) 100 MG CAPSULE    TAKE 1 CAPSULE BY MOUTH THREE TIMES A DAY   METFORMIN (GLUCOPHAGE) 1000 MG TABLET    Take 1 tablet (1,000 mg total) by mouth 2 (two) times daily with a meal.   ROSUVASTATIN (CRESTOR) 5 MG TABLET    Take 1 tablet (5 mg total) by mouth daily.  Modified Medications   No medications on file  Discontinued Medications   No medications on file    Physical Exam:  Vitals:   07/16/21 1358  BP: (!) 150/80  Pulse: 91  Temp: (!) 97.5 F (36.4 C)  SpO2: 96%  Height: 5\' 4"  (1.626 m)   Body mass index is 26.61 kg/m. Wt Readings from Last 3 Encounters:  01/08/21 155 lb (70.3 kg)  10/01/20 146 lb (66.2 kg)  09/06/20 147 lb (66.7 kg)    Physical Exam Constitutional:      General: She is not in acute distress.    Appearance: She is well-developed. She is not diaphoretic.  HENT:     Head: Normocephalic and atraumatic.     Right Ear: Tympanic membrane, ear canal and external ear normal.     Left Ear: Tympanic membrane, ear canal and external ear normal.     Nose: Congestion and rhinorrhea present.     Mouth/Throat:     Pharynx: No oropharyngeal exudate.  Eyes:     Conjunctiva/sclera: Conjunctivae normal.     Pupils: Pupils are equal, round, and reactive to light.  Cardiovascular:     Rate and Rhythm: Normal rate and regular rhythm.     Heart sounds: Normal heart sounds.  Pulmonary:     Effort: Pulmonary effort is normal. No respiratory distress.     Breath sounds: Normal breath sounds. No wheezing.  Musculoskeletal:     Cervical back: Normal range of motion and neck supple.     Right lower leg: No edema.      Left lower leg: No edema.  Skin:    General: Skin is warm and dry.  Neurological:     Mental Status: She is alert.  Psychiatric:        Mood and Affect: Mood normal.    Labs reviewed: Basic Metabolic Panel: Recent Labs    08/15/20 0948 09/06/20 1143 01/11/21 0903  NA 139 139 140  K 4.7 4.8 4.6  CL 100 100 103  CO2 31 29 29   GLUCOSE 192* 117* 171*  BUN 17 16 17   CREATININE 0.70 0.63 0.71  CALCIUM 9.5 9.8 9.6  TSH  --   --  2.32   Liver Function Tests: Recent Labs    09/06/20 1143 01/11/21 0903  AST 13* 11  ALT 11 9  ALKPHOS 38  --   BILITOT 0.8 0.4  PROT 7.7 6.6  ALBUMIN 4.2  --    No results for input(s): LIPASE, AMYLASE in the last 8760 hours. No results for input(s): AMMONIA in the last 8760 hours. CBC: Recent Labs    07/27/20 1604 08/15/20 0948 09/06/20 1143 01/11/21 0903  WBC 9.7 10.1 11.9* 9.7  NEUTROABS 5,675 6,828  --  6,354  HGB 14.4 14.7 14.6 13.8  HCT 42.5 43.8 44.3 42.2  MCV 90.8 90.9 92.5 92.7  PLT 385 423* 342 331   Lipid Panel: Recent Labs    08/15/20 0945 01/11/21 0903  CHOL 160 118  HDL 40* 43*  LDLCALC 93 51  TRIG 175* 162*  CHOLHDL 4.0 2.7   TSH: Recent Labs    01/11/21 0903  TSH 2.32   A1C: Lab Results  Component Value Date   HGBA1C 8.0 (H) 01/11/2021     Assessment/Plan 1. Persistent cough -?COPD due to hx of smoking and lives with daughter who smokes. She also has a cat in her room which likely contributing to nasal congestion and post nasal drip.  -to increase hydration -mucinex DM by mouth twice daily -nasal rinse daily - DG Chest 2 View; Future  2. Nasal congestion Start zyrtec 10 mg by mouth daily Start mucinex DM by mouth twice daily with full glass of water.  -recommended air purifier and to clean room thoroughly. Due to congestion and cough recommended to avoid irritants such as smoke.   Follow up precautions discussed.  Carlos American. [place], [facility] [phone]

## 2021-07-16 NOTE — Patient Instructions (Addendum)
Start zyrtec 10 mg by mouth daily Start mucinex DM by mouth twice daily with full glass of water.  Chest xray- can go to [facility]- walk in  Air purifier into the room   If cough does not improve, shortness of breath, feeling bad/fever to follow up.

## 2021-07-17 ENCOUNTER — Other Ambulatory Visit: Payer: Self-pay | Admitting: *Deleted

## 2021-07-17 ENCOUNTER — Ambulatory Visit: Payer: Medicare HMO | Admitting: Family

## 2021-07-17 MED ORDER — METFORMIN HCL 1000 MG PO TABS
1000.0000 mg | ORAL_TABLET | Freq: Two times a day (BID) | ORAL | 3 refills | Status: DC
Start: 2021-07-17 — End: 2022-07-03

## 2021-07-19 ENCOUNTER — Other Ambulatory Visit: Payer: Self-pay | Admitting: Orthopedic Surgery

## 2021-07-19 DIAGNOSIS — R059 Cough, unspecified: Secondary | ICD-10-CM

## 2021-07-20 ENCOUNTER — Other Ambulatory Visit: Payer: Self-pay | Admitting: Family

## 2021-07-20 DIAGNOSIS — E11319 Type 2 diabetes mellitus with unspecified diabetic retinopathy without macular edema: Secondary | ICD-10-CM

## 2021-08-05 ENCOUNTER — Other Ambulatory Visit: Payer: Self-pay | Admitting: Family

## 2021-08-27 ENCOUNTER — Telehealth: Payer: Self-pay | Admitting: Family

## 2021-08-27 NOTE — Telephone Encounter (Signed)
Update medication list

## 2021-08-27 NOTE — Telephone Encounter (Signed)
Completed.

## 2021-08-27 NOTE — Telephone Encounter (Signed)
Called patient and she states that she's NOT taking "Crestor 5mg ". Patient states that nobody told her why she needed to take it? Patient also expressed that she didn't like the way Crestor made her feel while she took it. Message routed back to PCP Ngetich, Dinah C, NP.

## 2021-08-27 NOTE — Telephone Encounter (Signed)
Fax received from [facility] indicating non-adherence to medication Crestor 5 mg tablet.please verify with patient if still taking medication or needs refill last filled July,2022 # 90 with 1 refill.

## 2021-10-02 IMAGING — CT CT HEAD W/O CM
3 series · 15 of 47 positions shown, 18 images · non-contrast
Comparison: None.

CLINICAL DATA: Head trauma, fall 2 weeks ago

EXAM:
CT HEAD WITHOUT CONTRAST
TECHNIQUE: Contiguous axial images were obtained from the base of the skull
through the vertex without intravenous contrast.

[Series 2: head 5.0 h30s · axial · 0.42mm/px · z∈[-53,+77]mm · 9 of 32 slices shown, 12 images]
[im 3/32  brain]
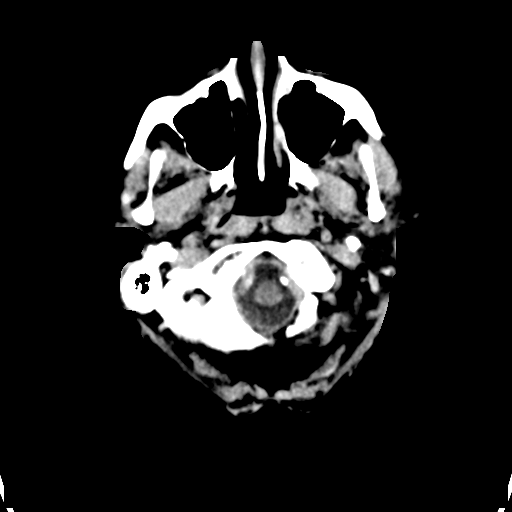
[im 3/32  bone]
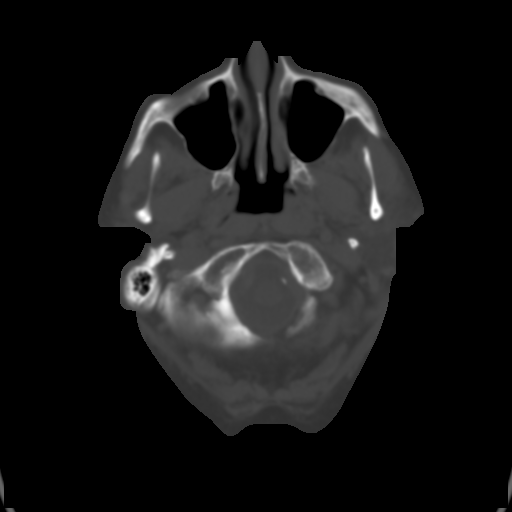
[im 6/32  brain]
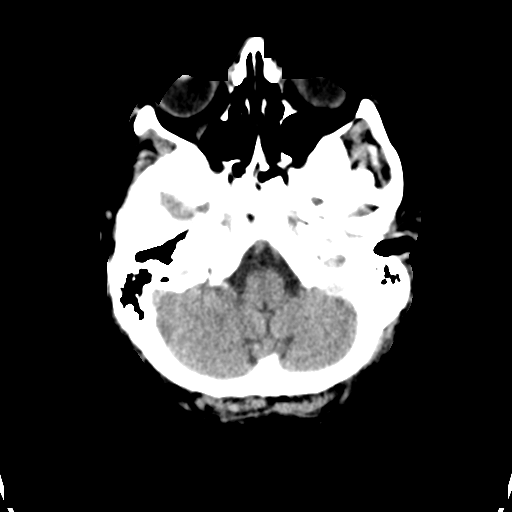
[im 9/32  brain]
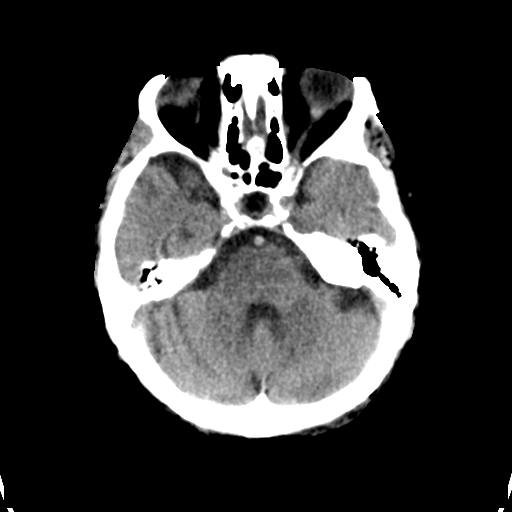
[im 12/32  brain]
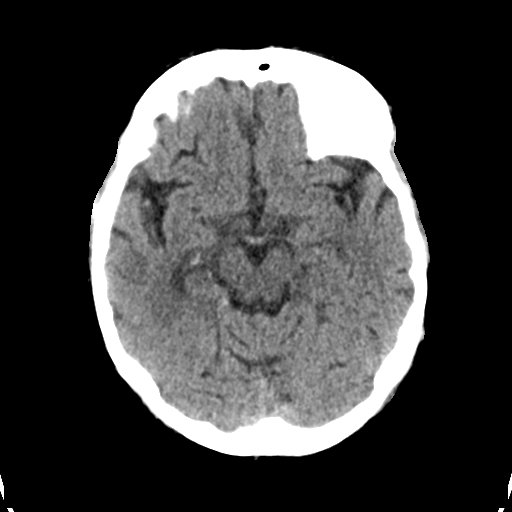
[im 17/32  brain]
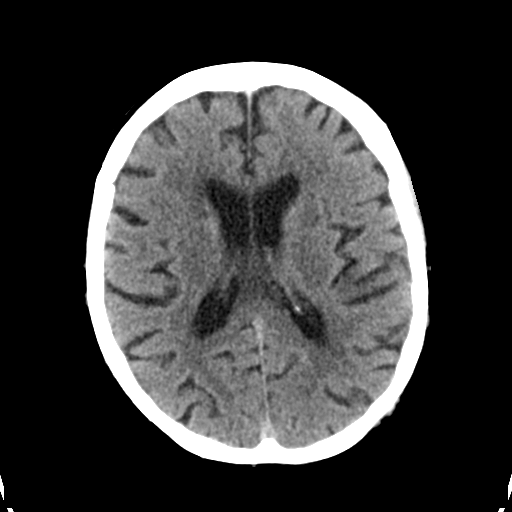
[im 17/32  bone]
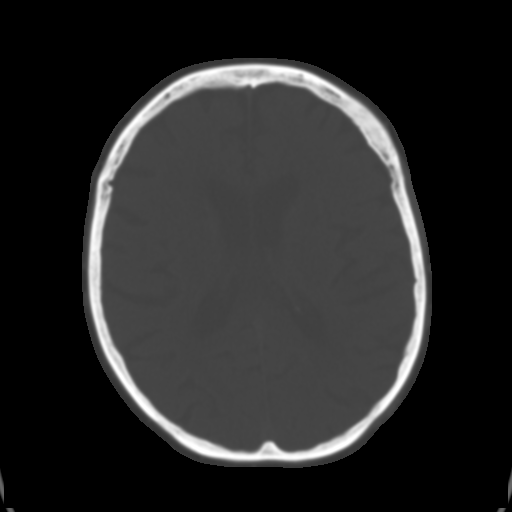
[im 20/32  brain]
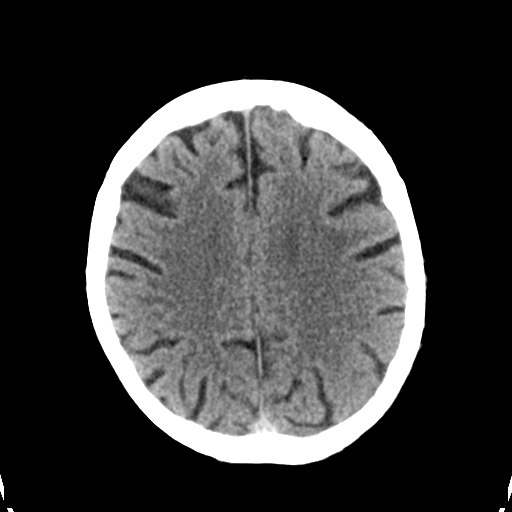
[im 23/32  brain]
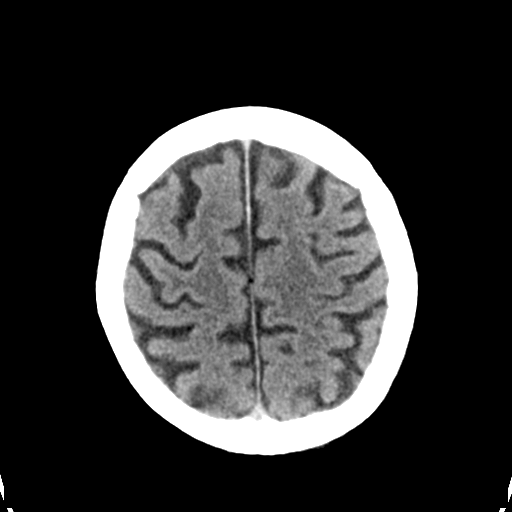
[im 26/32  brain]
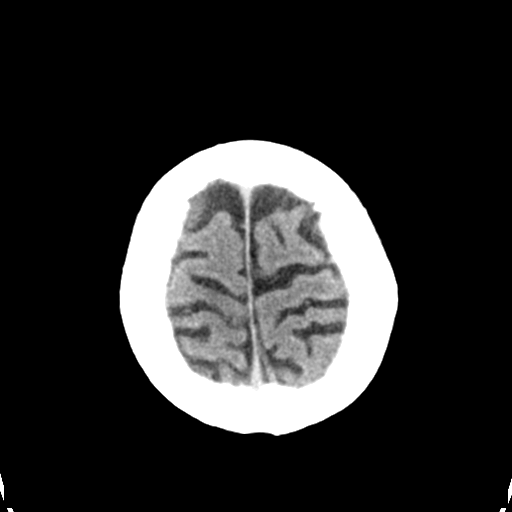
[im 29/32  brain]
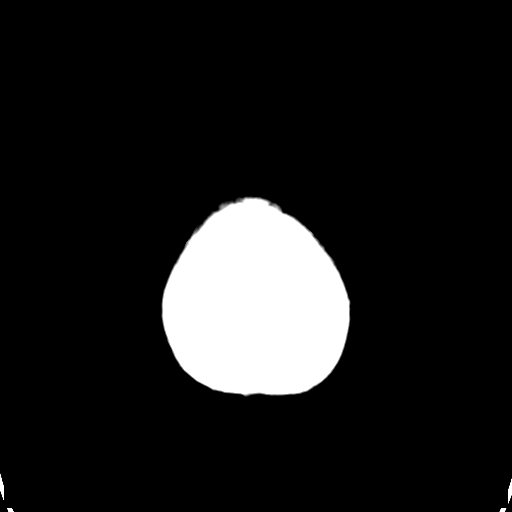
[im 29/32  bone]
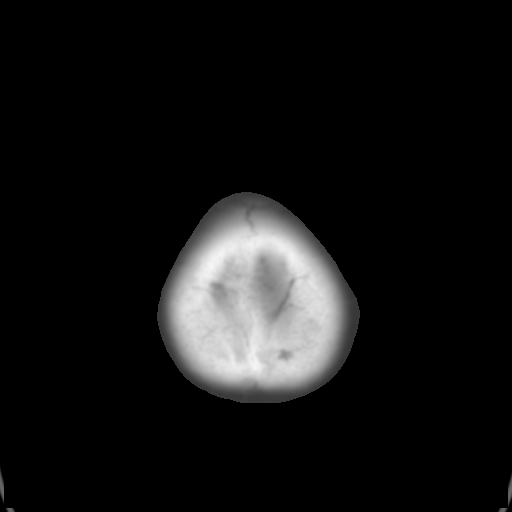

[Series 4: head 3.0 mpr cor · coronal · 0.33mm/px · 3 of 67 slices shown]
[im 23/67  brain]
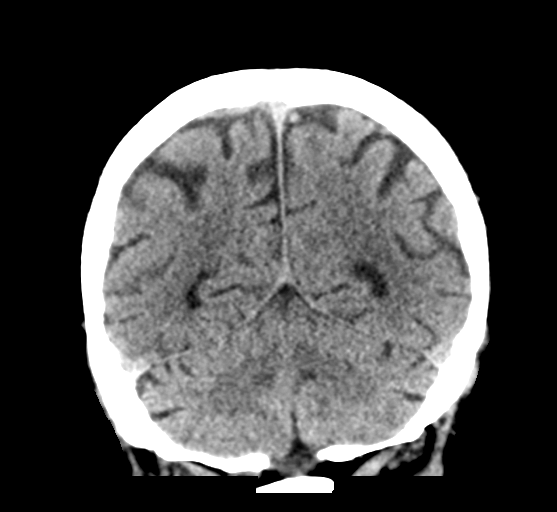
[im 30/67  brain]
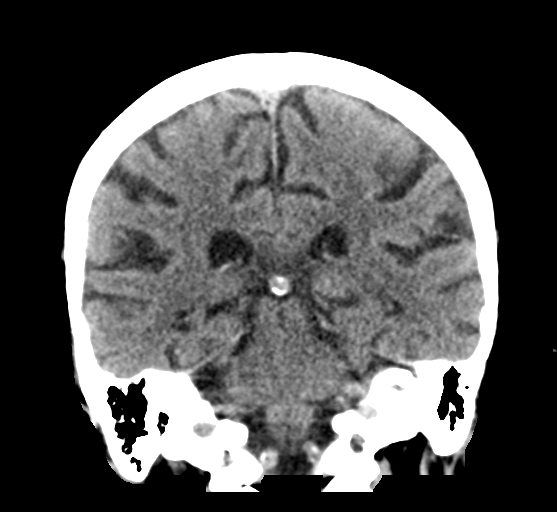
[im 37/67  brain]
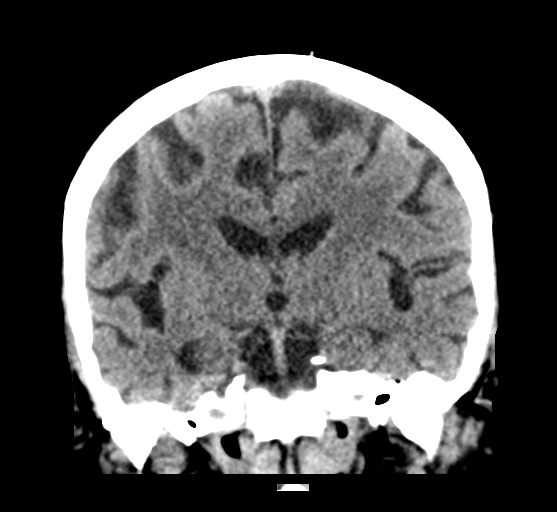

[Series 5: head 3.0 mpr sag · sagittal · 0.32mm/px · 3 of 67 slices shown]
[im 23/67  brain]
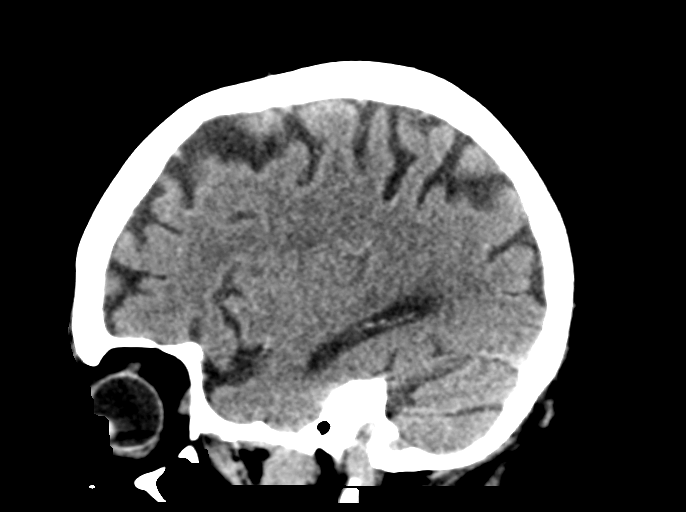
[im 34/67  brain]
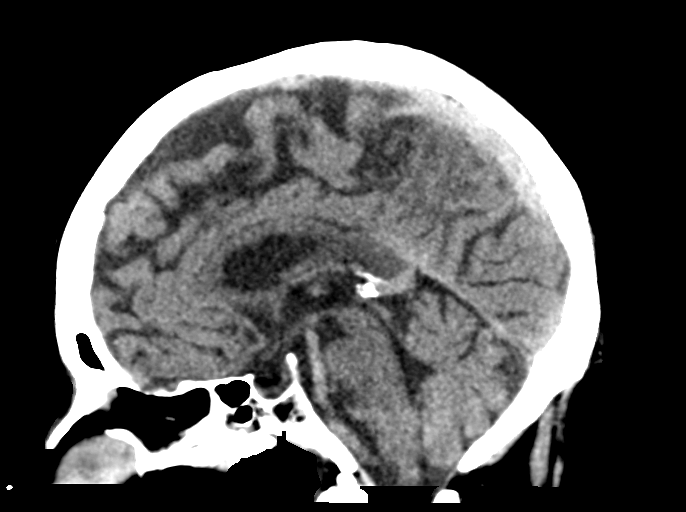
[im 45/67  brain]
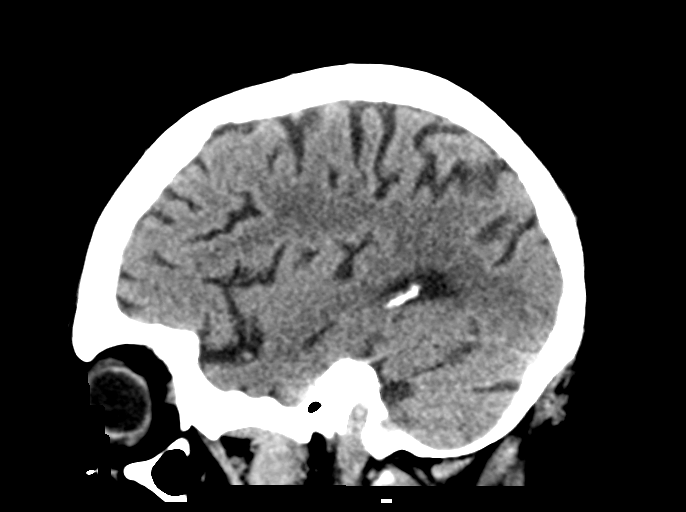

[15 of 47 positions shown; findings below may reference images not displayed]

FINDINGS: Brain: No evidence of acute territorial infarction, hemorrhage,
hydrocephalus,extra-axial collection or mass lesion/mass effect.
There is dilatation the ventricles and sulci consistent with
age-related atrophy. Low-attenuation changes in the deep white
matter consistent with small vessel ischemia.

Vascular: No hyperdense vessel or unexpected calcification.

Skull: The skull is intact. No fracture or focal lesion identified.

Sinuses/Orbits: The visualized paranasal sinuses and mastoid air
cells are clear. The orbits and globes intact.

Other: None
IMPRESSION: No acute intracranial abnormality.

Findings consistent with age related atrophy and chronic small
vessel ischemia

## 2021-10-09 ENCOUNTER — Other Ambulatory Visit: Payer: Self-pay | Admitting: Family

## 2021-10-09 DIAGNOSIS — G629 Polyneuropathy, unspecified: Secondary | ICD-10-CM

## 2021-10-31 ENCOUNTER — Encounter: Payer: Self-pay | Admitting: Nurse Practitioner

## 2021-10-31 ENCOUNTER — Ambulatory Visit: Payer: Medicare HMO | Admitting: Nurse Practitioner

## 2021-10-31 ENCOUNTER — Telehealth: Payer: Self-pay

## 2021-10-31 DIAGNOSIS — Z0000 Encounter for general adult medical examination without abnormal findings: Secondary | ICD-10-CM

## 2021-10-31 NOTE — Progress Notes (Signed)
This service is provided via telemedicine ? ?No vital signs collected/recorded due to the encounter was a telemedicine visit.  ? ?Location of patient (ex: home, work):  Home ? ?Patient consents to a telephone visit:  Yes, see encounter dated 10/31/2020 ? ?Location of the provider (ex: office, home):  [place] ? ?Name of any referring provider:  Marlowe Sax, NP ? ?Names of all persons participating in the telemedicine service and their role in the encounter:  Sherrie Mustache, Nurse Practitioner, Carroll Kinds, CMA, and patient.  ? ?Time spent on call:  10 minutes with medical assistant ? ?

## 2021-10-31 NOTE — Telephone Encounter (Signed)
Ms. galena, logie are scheduled for a virtual visit with your provider today.   ? ?Just as we do with appointments in the office, we must obtain your consent to participate.  Your consent will be active for this visit and any virtual visit you may have with one of our providers in the next 365 days.   ? ?If you have a MyChart account, I can also send a copy of this consent to you electronically.  All virtual visits are billed to your insurance company just like a traditional visit in the office.  As this is a virtual visit, video technology does not allow for your provider to perform a traditional examination.  This may limit your provider's ability to fully assess your condition.  If your provider identifies any concerns that need to be evaluated in person or the need to arrange testing such as labs, EKG, etc, we will make arrangements to do so.   ? ?Although advances in technology are sophisticated, we cannot ensure that it will always work on either your end or our end.  If the connection with a video visit is poor, we may have to switch to a telephone visit.  With either a video or telephone visit, we are not always able to ensure that we have a secure connection.   I need to obtain your verbal consent now.   Are you willing to proceed with your visit today?  ? ?EPSIE WALTHALL has provided verbal consent on 10/31/2021 for a virtual visit (video or telephone). ? ? ?Carroll Kinds, CMA ?10/31/2021  2:13 PM ?  ?

## 2021-10-31 NOTE — Progress Notes (Signed)
? ?Subjective:  ? Barbara Golden is a 86 y.o. female who presents for Medicare Annual (Subsequent) preventive examination. ? ?Review of Systems    ? ?Cardiac Risk Factors include: diabetes mellitus;advanced age (>60mn, >>31women);sedentary lifestyle ? ?   ?Objective:  ?  ?There were no vitals filed for this visit. ?There is no height or weight on file to calculate BMI. ? ? ?  10/30/2021  ?  4:46 PM 07/08/2021  ?  3:01 PM 01/08/2021  ? 11:39 AM 10/01/2020  ?  4:17 PM 09/11/2020  ?  6:12 AM 09/06/2020  ? 11:15 AM 08/29/2020  ? 12:19 PM  ?Advanced Directives  ?Does Patient Have a Medical Advance Directive? Yes Yes Yes Yes Yes Yes Yes  ?Type of Advance Directive Living will Living will Living will [place] Living will;Healthcare Power of Attorney  ?Does patient want to make changes to medical advance directive? No - Patient declined No - Patient declined No - Patient declined No - Patient declined No - Patient declined No - Patient declined Yes (MAU/Ambulatory/Procedural Areas - Information given)  ?Copy of [place] Chart?    No - copy requested No - copy requested No - copy requested   ? ? ?Current Medications (verified) ?Outpatient Encounter Medications as of 10/31/2021  ?Medication Sig  ? albuterol (VENTOLIN HFA) 108 (90 Base) MCG/ACT inhaler TAKE 2 PUFFS BY MOUTH EVERY 6 HOURS AS NEEDED FOR WHEEZE OR SHORTNESS OF BREATH  ? Continuous Blood Gluc Receiver (FREESTYLE LIBRE 14 DAY READER) DEVI 1 Units by Does not apply route 2 (two) times daily. H35.30, E11.65  ? Continuous Blood Gluc Sensor (FREESTYLE LIBRE 14 DAY SENSOR) MISC Apply 1 sensor every 14 days E11.319  ? Dulaglutide (TRULICITY) 02.58MNI/7.7OESOPN INJECT 0.75 MG INTO THE SKIN ONCE A WEEK.  ? Elastic Bandages & Supports (MEDICAL COMPRESSION THIGH HIGH) MISC 1 each by Does not apply route daily.  ? famotidine (PEPCID) 10 MG tablet  Take 10 mg by mouth as needed for heartburn or indigestion.  ? fexofenadine (ALLEGRA) 60 MG tablet Take 60 mg by mouth daily.  ? gabapentin (NEURONTIN) 100 MG capsule TAKE 1 CAPSULE BY MOUTH THREE TIMES A DAY  ? metFORMIN (GLUCOPHAGE) 1000 MG tablet Take 1 tablet (1,000 mg total) by mouth 2 (two) times daily with a meal.  ? [DISCONTINUED] cetirizine (ZYRTEC) 10 MG tablet Take 10 mg by mouth daily as needed for allergies.  ? [DISCONTINUED] ranitidine (ZANTAC) 150 MG tablet Take 150 mg by mouth daily as needed for heartburn.  ? ?No facility-administered encounter medications on file as of 10/31/2021.  ? ? ?Allergies (verified) ?Codeine  ? ?History: ?Past Medical History:  ?Diagnosis Date  ? Allergy   ? Cancer ([facility]   ? endometrial  ? Cataract   ? Diabetes [place] complication ([place]   ? type 2  ? GERD (gastroesophageal reflux disease)   ? High cholesterol   ? Jaundice   ? as a child  ? Macular degeneration   ? Nuclear sclerotic cataract of both eyes 09/05/2020  ? Varicose veins of lower extremity   ? right leg  ? ?Past Surgical History:  ?Procedure Laterality Date  ? ROBOTIC ASSISTED TOTAL HYSTERECTOMY WITH BILATERAL SALPINGO OOPHERECTOMY Bilateral 09/11/2020  ? Procedure: XI ROBOTIC ASSISTED TOTAL HYSTERECTOMY WITH BILATERAL SALPINGO OOPHORECTOMY;  Surgeon: REveritt Amber MD;  Location: [facility];  Service: Gynecology;  Laterality: Bilateral;  ?  SENTINEL NODE BIOPSY N/A 09/11/2020  ? Procedure: SENTINEL NODE BIOPSY;  Surgeon: Everitt Amber, MD;  Location: [facility];  Service: Gynecology;  Laterality: N/A;  ? TUBAL LIGATION Bilateral   ? ?Family History  ?Problem Relation Age of Onset  ? Cancer Mother   ?     colon/stomach  ? Heart disease Father   ? Heart attack Sister   ? Cancer Nephew   ?     bile duct  ? ?Social History  ? ?Socioeconomic History  ? Marital status: Widowed  ?  Spouse name: Not on file  ? Number of children: 1  ? Years of education: Not on file  ? Highest education level: Not on file  ?Occupational  History  ? Occupation: retired  ?Tobacco Use  ? Smoking status: Former  ?  Packs/day: 0.50  ?  Types: Cigarettes  ?  Quit date: 03/26/2009  ?  Years since quitting: 12.6  ? Smokeless tobacco: Never  ?Vaping Use  ? Vaping Use: Never used  ?Substance and Sexual Activity  ? Alcohol use: No  ? Drug use: No  ? Sexual activity: Not Currently  ?Other Topics Concern  ? Not on file  ?Social History Narrative  ? 3 Step Daughters/lives with Daughter(Pam Bouwer) and son-in-law.  ? ?Social Determinants of Health  ? ?Financial Resource Strain: Not on file  ?Food Insecurity: Not on file  ?Transportation Needs: Not on file  ?Physical Activity: Not on file  ?Stress: Not on file  ?Social Connections: Not on file  ? ? ?Tobacco Counseling ?Counseling given: Not Answered ? ? ?Clinical Intake: ? ?Pre-visit preparation completed: Yes ? ?Pain : No/denies pain ? ?  ? ?BMI - recorded: 26 ?Nutritional Status: BMI 25 -29 Overweight ?Diabetes: Yes ? ?How often do you need to have someone help you when you read instructions, pamphlets, or other written materials from your doctor or pharmacy?: 2 - Rarely ? ?Diabetic?yes ? ?  ? ?  ? ? ?Activities of Daily Living ? ?  10/31/2021  ?  2:15 PM  ?In your present state of health, do you have any difficulty performing the following activities:  ?Hearing? 0  ?Vision? 1  ?Comment macular degeneration  ?Difficulty concentrating or making decisions? 0  ?Walking or climbing stairs? 1  ?Comment has difficulty with a lot of steps  ?Dressing or bathing? 0  ?Doing errands, shopping? 1  ?Comment does not drive  ?Preparing Food and eating ? N  ?Using the Toilet? N  ?In the past six months, have you accidently leaked urine? Y  ?Do you have problems with loss of bowel control? Y  ?Managing your Medications? N  ?Managing your Finances? N  ?Housekeeping or managing your Housekeeping? N  ? ? ?Patient Care Team: ?Ngetich, Nelda Bucks, NP as PCP - General (Family Medicine) ? ?Indicate any recent Medical Services you may have  received from other than [facility] providers in the past year (date may be approximate). ? ?   ?Assessment:  ? This is a routine wellness examination for Barbara Golden. ? ?Hearing/Vision screen ?Hearing Screening - Comments:: Patient has no hearing problems. ?Vision Screening - Comments:: Patient wears glasses. Patient had cataracts removed and has macular degeneration.Patient has ahd eye exam within past year. Patient sees Dr. Gershon Crane. ? ?Dietary issues and exercise activities discussed: ?Current Exercise Habits: The patient does not participate in regular exercise at present ? ? Goals Addressed   ?None ?  ? ?Depression Screen ? ?  10/31/2021  ?  1:58  PM 04/16/2020  ?  3:07 PM 12/15/2019  ?  2:37 PM 11/04/2019  ?  1:22 PM 10/06/2019  ?  1:43 PM 09/21/2019  ? 11:57 AM  ?PHQ 2/9 Scores  ?PHQ - 2 Score 0 0 0 0 0 0  ?  ?Fall Risk ? ?  10/31/2021  ?  2:00 PM 07/08/2021  ?  3:01 PM 01/08/2021  ? 11:38 AM 08/06/2020  ? 11:37 AM 07/27/2020  ?  3:00 PM  ?Fall Risk   ?Falls in the past year? 0 0 0 0 0  ?Number falls in past yr: 0 0 0 0 0  ?Injury with Fall? 0 0 0 0 0  ?Risk for fall due to : No Fall Risks No Fall Risks     ?Follow up Falls evaluation completed Falls evaluation completed     ? ? ?FALL RISK PREVENTION PERTAINING TO THE HOME: ? ?Any stairs in or around the home? Yes  ?If so, are there any without handrails? Yes  ?Home free of loose throw rugs in walkways, pet beds, electrical cords, etc? Yes  ?Adequate lighting in your home to reduce risk of falls? Yes  ? ?ASSISTIVE DEVICES UTILIZED TO PREVENT FALLS: ? ?Life alert? No  ?Use of a cane, walker or w/c? Yes  ?Grab bars in the bathroom? Yes  ?Shower chair or bench in shower? Yes  ?Elevated toilet seat or a handicapped toilet? No  ? ?TIMED UP AND GO: ? ?Was the test performed? No .  ? ? ?Cognitive Function: ?  ?  ? ?  10/31/2021  ?  2:02 PM  ?6CIT Screen  ?What Year? 0 points  ?What month? 0 points  ?What time? 0 points  ?Count back from 20 0 points  ?Months in reverse 0 points  ?Repeat  phrase 4 points  ?Total Score 4 points  ? ? ?Immunizations ?Immunization History  ?Administered Date(s) Administered  ? Fluad Quad(high Dose 65+) 04/16/2020  ? Influenza, High Dose Seasonal PF 06/10/2019  ? Mode

## 2021-10-31 NOTE — Patient Instructions (Signed)
Ms. Friedl , ?Thank you for taking time to come for your Medicare Wellness Visit. I appreciate your ongoing commitment to your health goals. Please review the following plan we discussed and let me know if I can assist you in the future.  ? ?Screening recommendations/referrals: ?Colonoscopy aged out ?Mammogram aged out ?Bone Density ordered- please call [phone] to schedule ?Recommended yearly ophthalmology/optometry visit for glaucoma screening and checkup ?Recommended yearly dental visit for hygiene and checkup ? ?Vaccinations: ?Influenza vaccine DUE in September  ?Pneumococcal vaccine DUE- recommend to get at your local pharmacy or can have in office ?Tdap vaccine -up to date ?Shingles vaccine DUE- recommend to get at your local pharmacy      ? ?Advanced directives: on file.  ? ?Conditions/risks identified: advance age, diabetes, macular degeneration.  ? ?Next appointment: yearly for awv ? ? ?Preventive Care 56 Years and Older, Female ?Preventive care refers to lifestyle choices and visits with your health care provider that can promote health and wellness. ?What does preventive care include? ?A yearly physical exam. This is also called an annual well check. ?Dental exams once or twice a year. ?Routine eye exams. Ask your health care provider how often you should have your eyes checked. ?Personal lifestyle choices, including: ?Daily care of your teeth and gums. ?Regular physical activity. ?Eating a healthy diet. ?Avoiding tobacco and drug use. ?Limiting alcohol use. ?Practicing safe sex. ?Taking low-dose aspirin every day. ?Taking vitamin and mineral supplements as recommended by your health care provider. ?What happens during an annual well check? ?The services and screenings done by your health care provider during your annual well check will depend on your age, overall health, lifestyle risk factors, and family history of disease. ?Counseling  ?Your health care provider may ask you questions about  your: ?Alcohol use. ?Tobacco use. ?Drug use. ?Emotional well-being. ?Home and relationship well-being. ?Sexual activity. ?Eating habits. ?History of falls. ?Memory and ability to understand (cognition). ?Work and work Statistician. ?Reproductive health. ?Screening  ?You may have the following tests or measurements: ?Height, weight, and BMI. ?Blood pressure. ?Lipid and cholesterol levels. These may be checked every 5 years, or more frequently if you are over 70 years old. ?Skin check. ?Lung cancer screening. You may have this screening every year starting at age 66 if you have a 30-pack-year history of smoking and currently smoke or have quit within the past 15 years. ?Fecal occult blood test (FOBT) of the stool. You may have this test every year starting at age 23. ?Flexible sigmoidoscopy or colonoscopy. You may have a sigmoidoscopy every 5 years or a colonoscopy every 10 years starting at age 59. ?Hepatitis C blood test. ?Hepatitis B blood test. ?Sexually transmitted disease (STD) testing. ?Diabetes screening. This is done by checking your blood sugar (glucose) after you have not eaten for a while (fasting). You may have this done every 1-3 years. ?Bone density scan. This is done to screen for osteoporosis. You may have this done starting at age 56. ?Mammogram. This may be done every 1-2 years. Talk to your health care provider about how often you should have regular mammograms. ?Talk with your health care provider about your test results, treatment options, and if necessary, the need for more tests. ?Vaccines  ?Your health care provider may recommend certain vaccines, such as: ?Influenza vaccine. This is recommended every year. ?Tetanus, diphtheria, and acellular pertussis (Tdap, Td) vaccine. You may need a Td booster every 10 years. ?Zoster vaccine. You may need this after age 59. ?Pneumococcal 13-valent conjugate (  PCV13) vaccine. One dose is recommended after age 63. ?Pneumococcal polysaccharide (PPSV23) vaccine.  One dose is recommended after age 73. ?Talk to your health care provider about which screenings and vaccines you need and how often you need them. ?This information is not intended to replace advice given to you by your health care provider. Make sure you discuss any questions you have with your health care provider. ?Document Released: 08/03/2015 Document Revised: 03/26/2016 Document Reviewed: 05/08/2015 ?Elsevier Interactive Patient Education ? 2017 [place]. ? ?Fall Prevention in the Home ?Falls can cause injuries. They can happen to people of all ages. There are many things you can do to make your home safe and to help prevent falls. ?What can I do on the outside of my home? ?Regularly fix the edges of walkways and driveways and fix any cracks. ?Remove anything that might make you trip as you walk through a door, such as a raised step or threshold. ?Trim any bushes or trees on the path to your home. ?Use bright outdoor lighting. ?Clear any walking paths of anything that might make someone trip, such as rocks or tools. ?Regularly check to see if handrails are loose or broken. Make sure that both sides of any steps have handrails. ?Any raised decks and porches should have guardrails on the edges. ?Have any leaves, snow, or ice cleared regularly. ?Use sand or salt on walking paths during winter. ?Clean up any spills in your garage right away. This includes oil or grease spills. ?What can I do in the bathroom? ?Use night lights. ?Install grab bars by the toilet and in the tub and shower. Do not use towel bars as grab bars. ?Use non-skid mats or decals in the tub or shower. ?If you need to sit down in the shower, use a plastic, non-slip stool. ?Keep the floor dry. Clean up any water that spills on the floor as soon as it happens. ?Remove soap buildup in the tub or shower regularly. ?Attach bath mats securely with double-sided non-slip rug tape. ?Do not have throw rugs and other things on the floor that can make  you trip. ?What can I do in the bedroom? ?Use night lights. ?Make sure that you have a light by your bed that is easy to reach. ?Do not use any sheets or blankets that are too big for your bed. They should not hang down onto the floor. ?Have a firm chair that has side arms. You can use this for support while you get dressed. ?Do not have throw rugs and other things on the floor that can make you trip. ?What can I do in the kitchen? ?Clean up any spills right away. ?Avoid walking on wet floors. ?Keep items that you use a lot in easy-to-reach places. ?If you need to reach something above you, use a strong step stool that has a grab bar. ?Keep electrical cords out of the way. ?Do not use floor polish or wax that makes floors slippery. If you must use wax, use non-skid floor wax. ?Do not have throw rugs and other things on the floor that can make you trip. ?What can I do with my stairs? ?Do not leave any items on the stairs. ?Make sure that there are handrails on both sides of the stairs and use them. Fix handrails that are broken or loose. Make sure that handrails are as long as the stairways. ?Check any carpeting to make sure that it is firmly attached to the stairs. Fix any carpet that is  loose or worn. ?Avoid having throw rugs at the top or bottom of the stairs. If you do have throw rugs, attach them to the floor with carpet tape. ?Make sure that you have a light switch at the top of the stairs and the bottom of the stairs. If you do not have them, ask someone to add them for you. ?What else can I do to help prevent falls? ?Wear shoes that: ?Do not have high heels. ?Have rubber bottoms. ?Are comfortable and fit you well. ?Are closed at the toe. Do not wear sandals. ?If you use a stepladder: ?Make sure that it is fully opened. Do not climb a closed stepladder. ?Make sure that both sides of the stepladder are locked into place. ?Ask someone to hold it for you, if possible. ?Clearly mark and make sure that you can  see: ?Any grab bars or handrails. ?First and last steps. ?Where the edge of each step is. ?Use tools that help you move around (mobility aids) if they are needed. These include: ?Canes. ?Walkers. ?Scooters. ?Crutc

## 2021-12-19 ENCOUNTER — Encounter (INDEPENDENT_AMBULATORY_CARE_PROVIDER_SITE_OTHER): Payer: Medicare HMO | Admitting: Ophthalmology

## 2021-12-26 ENCOUNTER — Ambulatory Visit (INDEPENDENT_AMBULATORY_CARE_PROVIDER_SITE_OTHER): Payer: Medicare HMO | Admitting: Ophthalmology

## 2021-12-26 ENCOUNTER — Encounter (INDEPENDENT_AMBULATORY_CARE_PROVIDER_SITE_OTHER): Payer: Self-pay | Admitting: Ophthalmology

## 2021-12-26 DIAGNOSIS — E119 Type 2 diabetes mellitus without complications: Secondary | ICD-10-CM | POA: Diagnosis not present

## 2021-12-26 DIAGNOSIS — H43813 Vitreous degeneration, bilateral: Secondary | ICD-10-CM

## 2021-12-26 DIAGNOSIS — H353123 Nonexudative age-related macular degeneration, left eye, advanced atrophic without subfoveal involvement: Secondary | ICD-10-CM | POA: Diagnosis not present

## 2021-12-26 DIAGNOSIS — H353114 Nonexudative age-related macular degeneration, right eye, advanced atrophic with subfoveal involvement: Secondary | ICD-10-CM

## 2021-12-26 NOTE — Assessment & Plan Note (Signed)
No detectable diabetic retinopathy 

## 2021-12-26 NOTE — Progress Notes (Addendum)
12/26/2021     CHIEF COMPLAINT Patient presents for  Chief Complaint  Patient presents with   Macular Degeneration      HISTORY OF PRESENT ILLNESS: Barbara Golden is a 86 y.o. female who presents to the clinic today for:   HPI   9 MOS FU OU OCT FP. Pt stated vision has gotten worse since last visit. Pt stated, "its hard for me to read anything. I can read a book and some of the print but then it gets blurry." Pt denies floaters and FOL.    Last edited by Silvestre Moment on 12/26/2021  1:28 PM.      Referring physician: Sandrea Hughs, NP [address] [place],  [place] [id number]  HISTORICAL INFORMATION:   Selected notes from the MEDICAL RECORD NUMBER    Lab Results  Component Value Date   HGBA1C 8.0 (H) 01/11/2021     CURRENT MEDICATIONS: No current outpatient medications on file. (Ophthalmic Drugs)   No current facility-administered medications for this visit. (Ophthalmic Drugs)   Current Outpatient Medications (Other)  Medication Sig   albuterol (VENTOLIN HFA) 108 (90 Base) MCG/ACT inhaler TAKE 2 PUFFS BY MOUTH EVERY 6 HOURS AS NEEDED FOR WHEEZE OR SHORTNESS OF BREATH   Continuous Blood Gluc Receiver (FREESTYLE LIBRE 14 DAY READER) DEVI 1 Units by Does not apply route 2 (two) times daily. H35.30, E11.65   Continuous Blood Gluc Sensor (FREESTYLE LIBRE 14 DAY SENSOR) MISC Apply 1 sensor every 14 days E11.319   Dulaglutide (TRULICITY) 6.16 WV/3.7TG SOPN INJECT 0.75 MG INTO THE SKIN ONCE A WEEK.   Elastic Bandages & Supports (MEDICAL COMPRESSION THIGH HIGH) MISC 1 each by Does not apply route daily.   famotidine (PEPCID) 10 MG tablet Take 10 mg by mouth as needed for heartburn or indigestion.   fexofenadine (ALLEGRA) 60 MG tablet Take 60 mg by mouth daily.   gabapentin (NEURONTIN) 100 MG capsule TAKE 1 CAPSULE BY MOUTH THREE TIMES A DAY   metFORMIN (GLUCOPHAGE) 1000 MG tablet Take 1 tablet (1,000 mg total) by mouth 2 (two) times daily with a meal.   No current  facility-administered medications for this visit. (Other)      REVIEW OF SYSTEMS: ROS   Negative for: Constitutional, Gastrointestinal, Neurological, Skin, Genitourinary, Musculoskeletal, HENT, Endocrine, Cardiovascular, Eyes, Respiratory, Psychiatric, Allergic/Imm, Heme/Lymph Last edited by Silvestre Moment on 12/26/2021  1:27 PM.       ALLERGIES Allergies  Allergen Reactions   Codeine Other (See Comments)    Causes lip burning and hyperactivity    PAST MEDICAL HISTORY Past Medical History:  Diagnosis Date   Allergy    Cancer ([place])    endometrial   Cataract    Diabetes mellitus without complication ([place])    type 2   GERD (gastroesophageal reflux disease)    High cholesterol    Jaundice    as a child   Macular degeneration    Nuclear sclerotic cataract of both eyes 09/05/2020   Varicose veins of lower extremity    right leg   Past Surgical History:  Procedure Laterality Date   ROBOTIC ASSISTED TOTAL HYSTERECTOMY WITH BILATERAL SALPINGO OOPHERECTOMY Bilateral 09/11/2020   Procedure: XI ROBOTIC ASSISTED TOTAL HYSTERECTOMY WITH BILATERAL SALPINGO OOPHORECTOMY;  Surgeon: Everitt Amber, MD;  Location: [facility];  Service: Gynecology;  Laterality: Bilateral;   SENTINEL NODE BIOPSY N/A 09/11/2020   Procedure: SENTINEL NODE BIOPSY;  Surgeon: Everitt Amber, MD;  Location: [facility];  Service: Gynecology;  Laterality: N/A;  TUBAL LIGATION Bilateral     FAMILY HISTORY Family History  Problem Relation Age of Onset   Cancer Mother        colon/stomach   Heart disease Father    Heart attack Sister    Cancer Nephew        bile duct    SOCIAL HISTORY Social History   Tobacco Use   Smoking status: Former    Packs/day: 0.50    Types: Cigarettes    Quit date: 03/26/2009    Years since quitting: 12.7   Smokeless tobacco: Never  Vaping Use   Vaping Use: Never used  Substance Use Topics   Alcohol use: No   Drug use: No         OPHTHALMIC EXAM:  Base Eye Exam     Visual Acuity  (ETDRS)       Right Left   Dist cc 20/50 -1 20/25 -2   Dist ph cc NI     Correction: Glasses         Tonometry (Tonopen, 1:35 PM)       Right Left   Pressure 14 12         Pupils       Pupils APD   Right PERRL None   Left PERRL None         Visual Fields       Left Right    Full Full         Extraocular Movement       Right Left    Full Full         Neuro/Psych     Oriented x3: Yes   Mood/Affect: Normal         Dilation     Both eyes: 1.0% Mydriacyl, 2.5% Phenylephrine @ 1:35 PM           Slit Lamp and Fundus Exam     External Exam       Right Left   External Normal Normal         Slit Lamp Exam       Right Left   Lids/Lashes Normal Normal   Conjunctiva/Sclera White and quiet White and quiet   Cornea Clear Clear   Anterior Chamber Deep and quiet Deep and quiet   Iris Round and reactive Round and reactive   Lens Centered posterior chamber intraocular lens Centered posterior chamber intraocular lens   Anterior Vitreous Normal Normal         Fundus Exam       Right Left   Posterior Vitreous Posterior vitreous detachment Posterior vitreous detachment   Disc thin rim INF thin rim INF, Peripapillary atrophy   C/D Ratio 0.5 0.6   Macula Geographic atrophy, pigment in the FAZ, no macular thickening, no exudates, no hemorrhage only a small island of foveal pigment remains Geographic atrophy, pigment in the FAZ, no macular thickening, no exudates, no hemorrhage only a small island of foveal pigment remains   Vessels Normal, no DR Normal, no DR   Periphery Reticular degeneration Reticular degeneration            IMAGING AND PROCEDURES  Imaging and Procedures for 12/26/21  OCT, Retina - OU - Both Eyes       Right Eye Quality was good. Scan locations included subfoveal. Central Foveal Thickness: 211. Progression has been stable. Findings include abnormal foveal contour, inner retinal atrophy, outer retinal atrophy.    Left Eye Quality was good. Scan locations included  subfoveal. Central Foveal Thickness: 243. Progression has been stable. Findings include abnormal foveal contour, inner retinal atrophy, outer retinal atrophy.   Notes No sign of CNVM OU  Incidental PVD OD     Color Fundus Photography Optos - OU - Both Eyes       Right Eye Progression has been stable. Disc findings include normal observations. Macula : geographic atrophy. Vessels : normal observations. Periphery : normal observations.   Left Eye Progression has been stable. Disc findings include normal observations. Vessels : normal observations. Periphery : normal observations.   Notes Geographic atrophy in a perifoveal location.  With retained good acuity OU.  No sign of CNVM               ASSESSMENT/PLAN:  Advanced nonexudative age-related macular degeneration of right eye with subfoveal involvement The nature of dry age related macular degeneration was discussed with the patient as well as its possible conversion to wet. The results of the AREDS 2 study was discussed with the patient. A diet rich in dark leafy green vegetables was advised and specific recommendations were made regarding supplements with AREDS 2 formulation . Control of hypertension and serum cholesterol may slow the disease. Smoking cessation is mandatory to slow the disease and diminish the risk of progressing to wet age related macular degeneration. The patient was instructed in the use of an [place] and was told to return immediately for any changes in the Grid. Stressed to the patient do not rub eyes  Two forms of dry ARMD exist, one with drusen deposits, yellowish deposits with no obvious atrophy,  ATROPHIC which is the loss of normal blood supply to the choroid (the orange or copper green color layer of the outer retina), which is seen as large areas of atrophy which grow over time to decrease vision.  This atrophic form of dry ARMD is  treatable as of Fall 2023.  The treatment consists of injection into the affected eye monthly at first, in attempt to slow the growth of the atrophy. The first approved medication is SYFOVRE (pegcetacoplan).     Advanced nonexudative age-related macular degeneration of left eye without subfoveal involvement The nature of dry age related macular degeneration was discussed with the patient as well as its possible conversion to wet. The results of the AREDS 2 study was discussed with the patient. A diet rich in dark leafy green vegetables was advised and specific recommendations were made regarding supplements with AREDS 2 formulation . Control of hypertension and serum cholesterol may slow the disease. Smoking cessation is mandatory to slow the disease and diminish the risk of progressing to wet age related macular degeneration. The patient was instructed in the use of an [place] and was told to return immediately for any changes in the Grid. Stressed to the patient do not rub eyes  Two forms of dry ARMD exist, one with drusen deposits, yellowish deposits with no obvious atrophy,  ATROPHIC which is the loss of normal blood supply to the choroid (the orange or copper green color layer of the outer retina), which is seen as large areas of atrophy which grow over time to decrease vision.  This atrophic form of dry ARMD is treatable as of Fall 2023.  The treatment consists of injection into the affected eye monthly at first, in attempt to slow the growth of the atrophy. The first approved medication is SYFOVRE (pegcetacoplan).     Posterior vitreous detachment of both eyes Physiologic OU  Diabetes  mellitus without complication (HCC) No detectable diabetic retinopathy      ICD-10-CM   1. Advanced nonexudative age-related macular degeneration of right eye with subfoveal involvement  H35.3114 OCT, Retina - OU - Both Eyes    Color Fundus Photography Optos - OU - Both Eyes    2. Advanced  nonexudative age-related macular degeneration of left eye without subfoveal involvement  H35.3123     3. Posterior vitreous detachment of both eyes  H43.813     4. Diabetes mellitus without complication ([place])  N02.7       1.  OU no sign of CNVM.  OU with geographic atrophy which is nearly surrounds the fovea with retained good acuity.  Each eye is an excellent candidate for Syfovre injection in the future.  2.  Findings discussed.  Patient instructed to call the office to make an appointment sometime after November 2023 in order to commence this discussion and potential therapy to slow the progression of geographic atrophy and preserve visual acuities were long as possible  3.  Ophthalmic Meds Ordered this visit:  No orders of the defined types were placed in this encounter.      Return in about 5 months (around 05/28/2022) for DILATE OU, OCT, COLOR FP,, discuss Syfovre.  There are no Patient Instructions on file for this visit.   Explained the diagnoses, plan, and follow up with the patient and they expressed understanding.  Patient expressed understanding of the importance of proper follow up care.   Clent Demark Naylea Wigington M.D. Diseases & Surgery of the Retina and Vitreous Retina & Diabetic [place] 12/26/21     Abbreviations: M myopia (nearsighted); A astigmatism; H hyperopia (farsighted); P presbyopia; Mrx spectacle prescription;  CTL contact lenses; OD right eye; OS left eye; OU both eyes  XT exotropia; ET esotropia; PEK punctate epithelial keratitis; PEE punctate epithelial erosions; DES dry eye syndrome; MGD meibomian gland dysfunction; ATs artificial tears; PFAT's preservative free artificial tears; [place] nuclear sclerotic cataract; PSC posterior subcapsular cataract; ERM epi-retinal membrane; PVD posterior vitreous detachment; RD retinal detachment; DM diabetes mellitus; DR diabetic retinopathy; NPDR non-proliferative diabetic retinopathy; PDR proliferative diabetic retinopathy;  CSME clinically significant macular edema; DME diabetic macular edema; dbh dot blot hemorrhages; CWS cotton wool spot; POAG primary open angle glaucoma; C/D cup-to-disc ratio; HVF humphrey visual field; GVF goldmann visual field; OCT optical coherence tomography; IOP intraocular pressure; BRVO Branch retinal vein occlusion; CRVO central retinal vein occlusion; CRAO central retinal artery occlusion; BRAO branch retinal artery occlusion; RT retinal tear; SB scleral buckle; PPV pars plana vitrectomy; VH Vitreous hemorrhage; PRP panretinal laser photocoagulation; IVK intravitreal kenalog; VMT vitreomacular traction; MH Macular hole;  NVD neovascularization of the disc; NVE neovascularization elsewhere; AREDS age related eye disease study; ARMD age related macular degeneration; POAG primary open angle glaucoma; EBMD epithelial/anterior basement membrane dystrophy; ACIOL anterior chamber intraocular lens; IOL intraocular lens; PCIOL posterior chamber intraocular lens; Phaco/IOL phacoemulsification with intraocular lens placement; [place] photorefractive keratectomy; LASIK laser assisted in situ keratomileusis; HTN hypertension; DM diabetes mellitus; COPD chronic obstructive pulmonary disease

## 2021-12-26 NOTE — Assessment & Plan Note (Signed)
The nature of dry age related macular degeneration was discussed with the patient as well as its possible conversion to wet. The results of the AREDS 2 study was discussed with the patient. A diet rich in dark leafy green vegetables was advised and specific recommendations were made regarding supplements with AREDS 2 formulation . Control of hypertension and serum cholesterol may slow the disease. Smoking cessation is mandatory to slow the disease and diminish the risk of progressing to wet age related macular degeneration. The patient was instructed in the use of an [place] Grid and was told to return immediately for any changes in the Grid. Stressed to the patient do not rub eyes  Two forms of dry ARMD exist, one with drusen deposits, yellowish deposits with no obvious atrophy,  ATROPHIC which is the loss of normal blood supply to the choroid (the orange or copper green color layer of the outer retina), which is seen as large areas of atrophy which grow over time to decrease vision.  This atrophic form of dry ARMD is treatable as of Fall 2023.  The treatment consists of injection into the affected eye monthly at first, in attempt to slow the growth of the atrophy. The first approved medication is SYFOVRE (pegcetacoplan).    

## 2021-12-26 NOTE — Assessment & Plan Note (Signed)
Physiologic OU 

## 2022-03-06 ENCOUNTER — Encounter (HOSPITAL_BASED_OUTPATIENT_CLINIC_OR_DEPARTMENT_OTHER): Payer: Self-pay

## 2022-03-06 ENCOUNTER — Other Ambulatory Visit: Payer: Self-pay

## 2022-03-06 ENCOUNTER — Emergency Department (HOSPITAL_BASED_OUTPATIENT_CLINIC_OR_DEPARTMENT_OTHER): Payer: Medicare HMO

## 2022-03-06 ENCOUNTER — Emergency Department (HOSPITAL_BASED_OUTPATIENT_CLINIC_OR_DEPARTMENT_OTHER)
Admission: EM | Admit: 2022-03-06 | Discharge: 2022-03-06 | Disposition: A | Discharge status: Home / Self Care | Payer: Medicare HMO | Attending: Emergency Medicine | Admitting: Emergency Medicine

## 2022-03-06 DIAGNOSIS — M7989 Other specified soft tissue disorders: Secondary | ICD-10-CM | POA: Diagnosis not present

## 2022-03-06 DIAGNOSIS — Z794 Long term (current) use of insulin: Secondary | ICD-10-CM | POA: Insufficient documentation

## 2022-03-06 DIAGNOSIS — M79604 Pain in right leg: Secondary | ICD-10-CM | POA: Diagnosis not present

## 2022-03-06 DIAGNOSIS — M79661 Pain in right lower leg: Secondary | ICD-10-CM | POA: Diagnosis not present

## 2022-03-06 NOTE — ED Triage Notes (Signed)
Patient here POV from Home.  Endorses Right Leg Swelling and Pain that began Today at approximately 1600. Also states she has a "Knot" to her Posterior Calf.   No Anticoagulants. No Injury.   NAD Noted during Triage. A&Ox4. GCS 15. Ambulatory with Cane.

## 2022-03-06 NOTE — ED Provider Notes (Signed)
[facility] [facility] EMERGENCY DEPT  Provider Note  CSN: [id number] Arrival date & time: 03/06/22 2145  History Chief Complaint  Patient presents with   Leg Pain    Barbara Golden is a 86 y.o. female reports she had onset of R calf pain earlier today, comes and goes. Feels like it's jumping and she felt a knot on her leg. She denies any pain in the proximal leg. No CP or SOB. Father had blood clots, but she does not have any history.    Home Medications Prior to Admission medications   Medication Sig Start Date End Date Taking? Authorizing Provider  albuterol (VENTOLIN HFA) 108 (90 Base) MCG/ACT inhaler TAKE 2 PUFFS BY MOUTH EVERY 6 HOURS AS NEEDED FOR WHEEZE OR SHORTNESS OF BREATH 07/19/21   Ngetich, Dinah C, NP  Continuous Blood Gluc Receiver (FREESTYLE LIBRE 14 DAY READER) DEVI 1 Units by Does not apply route 2 (two) times daily. H35.30, E11.65 10/07/19   Reed, Tiffany L, DO  Continuous Blood Gluc Sensor (FREESTYLE LIBRE 14 DAY SENSOR) MISC Apply 1 sensor every 14 days E11.319 05/20/21   Ngetich, Dinah C, NP  Dulaglutide (TRULICITY) 2.97 LG/9.2JJ SOPN INJECT 0.75 MG INTO THE SKIN ONCE A WEEK. 07/23/21   Ngetich, Nelda Bucks, NP  Elastic Bandages & Supports (MEDICAL COMPRESSION THIGH HIGH) MISC 1 each by Does not apply route daily. 04/12/20   Wieters, Hallie C, PA-C  famotidine (PEPCID) 10 MG tablet Take 10 mg by mouth as needed for heartburn or indigestion.    [provider]  fexofenadine (ALLEGRA) 60 MG tablet Take 60 mg by mouth daily.    [provider]  gabapentin (NEURONTIN) 100 MG capsule TAKE 1 CAPSULE BY MOUTH THREE TIMES A DAY 10/09/21   Ngetich, Dinah C, NP  metFORMIN (GLUCOPHAGE) 1000 MG tablet Take 1 tablet (1,000 mg total) by mouth 2 (two) times daily with a meal. 07/17/21   Ngetich, Dinah C, NP  cetirizine (ZYRTEC) 10 MG tablet Take 10 mg by mouth daily as needed for allergies.  09/13/19  [provider]  ranitidine (ZANTAC) 150 MG tablet Take 150  mg by mouth daily as needed for heartburn.  09/13/19  [provider]     Allergies    Codeine   Review of Systems   Review of Systems Please see HPI for pertinent positives and negatives  Physical Exam BP (!) 178/70 (BP Location: Right Arm)   Pulse 91   Temp 97.8 F (36.6 C)   Resp 18   Ht '5\' 4"'$  (1.626 m)   Wt 70.3 kg   LMP 07/21/1998 (Approximate)   SpO2 96%   BMI 26.60 kg/m   Physical Exam Vitals and nursing note reviewed.  Constitutional:      Appearance: Normal appearance.  HENT:     Head: Normocephalic and atraumatic.     Nose: Nose normal.     Mouth/Throat:     Mouth: Mucous membranes are moist.  Eyes:     Extraocular Movements: Extraocular movements intact.     Conjunctiva/sclera: Conjunctivae normal.  Cardiovascular:     Rate and Rhythm: Normal rate.  Pulmonary:     Effort: Pulmonary effort is normal.     Breath sounds: Normal breath sounds.  Abdominal:     General: Abdomen is flat.     Palpations: Abdomen is soft.     Tenderness: There is no abdominal tenderness.  Musculoskeletal:        General: No swelling, tenderness or deformity. Normal  range of motion.     Cervical back: Neck supple.     Right lower leg: No edema.     Left lower leg: No edema.  Skin:    General: Skin is warm and dry.  Neurological:     General: No focal deficit present.     Mental Status: She is alert.  Psychiatric:        Mood and Affect: Mood normal.     ED Results / Procedures / Treatments   EKG None  Procedures Procedures  Medications Ordered in the ED Medications - No data to display  Initial Impression and Plan  Patient here with RLE pain, concerned for DVT. Will check doppler. Exam is benign at the time of my evaluation.   ED Course   Clinical Course as of 03/06/22 2347  Thu Mar 06, 2022  2346 I personally viewed the images from radiology studies and agree with radiologist interpretation: Doppler is neg for DVT. Patient was advised to rest  over the next few days, call her doctor if pain persists and RTED for any other concerns.   [CS]    Clinical Course User Index [CS] Truddie Hidden, MD     MDM Rules/Calculators/A&P Medical Decision Making Problems Addressed: Right leg pain: acute illness or injury  Amount and/or Complexity of Data Reviewed Radiology: ordered and independent interpretation performed. Decision-making details documented in ED Course.    Final Clinical Impression(s) / ED Diagnoses Final diagnoses:  Right leg pain    Rx / DC Orders ED Discharge Orders     None        Truddie Hidden, MD 03/06/22 2347

## 2022-03-21 ENCOUNTER — Other Ambulatory Visit: Payer: Self-pay | Admitting: Family

## 2022-03-21 DIAGNOSIS — E11319 Type 2 diabetes mellitus with unspecified diabetic retinopathy without macular edema: Secondary | ICD-10-CM

## 2022-04-11 ENCOUNTER — Other Ambulatory Visit: Payer: Self-pay | Admitting: Family

## 2022-04-11 DIAGNOSIS — G629 Polyneuropathy, unspecified: Secondary | ICD-10-CM

## 2022-04-14 NOTE — Progress Notes (Unsigned)
HPI: Barbara Golden is a 86 y.o. female, who is here today with her daughter to establish care.  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  Chronic medical problems: DM II,HLD,macular degeneration, back pain, varicose vein disease,endometrial cancer, HTN, and neuropathy among some. Endometrial cancer dx'ed in 08/2020,s/p robotic assisted total hysterectomy, BSO. SHe follows with Dr Denman George. DM II Dx;ed at age 45. She is on Metformin 0623 mg bid and Trulicity 7.62 mg weekly.  BS's 140-150's. Negative for hypoglycemic events. Negative for polydipsia,polyuria, or polyphagia. Bilateral burning feet sensation. She is on Gabapentin 100 mg tid.  Lab Results  Component Value Date   HGBA1C 8.0 (H) 01/11/2021   HTN listed on PMHx, she is on non pharmacologic treatment.  Lab Results  Component Value Date   CREATININE 0.71 01/11/2021   BUN 17 01/11/2021   NA 140 01/11/2021   K 4.6 01/11/2021   CL 103 01/11/2021   CO2 29 01/11/2021   Lab Results  Component Value Date   MICROALBUR 0.6 05/04/2020   HLD: She is not on pharmacologic treatment, she was on Rosuvastatin 5 mg  Lab Results  Component Value Date   CHOL 118 01/11/2021   HDL 43 (L) 01/11/2021   LDLCALC 51 01/11/2021   TRIG 162 (H) 01/11/2021   CHOLHDL 2.7 01/11/2021   Concerns today: Episodes of soft and loose stools usually after eating, occasionally she has had fecal incontinence, she does not make it to the bathroom. Fresh fruit and vegetables seem to exacerbate problem. Negative for associated abdominal pain, nausea, vomiting, changes in bowel habits, blood in stool or melena. She states that problem has been going on for a few months but her daughter remembers this complaint for at least 2 years. Hx of stress incontinence.  Review of Systems  Constitutional:  Negative for activity change, appetite change and fever.  HENT:  Negative for mouth sores, nosebleeds and sore throat.   Respiratory:  Negative for  cough, shortness of breath and wheezing.   Cardiovascular:  Negative for chest pain, palpitations and leg swelling.  Endocrine: Negative for cold intolerance and heat intolerance.  Genitourinary:  Negative for decreased urine volume, dysuria and hematuria.  Musculoskeletal:  Positive for back pain and gait problem.  Skin:  Negative for rash.  Neurological:  Negative for syncope, weakness and headaches.  Rest see pertinent positives and negatives per HPI.  Current Outpatient Medications on File Prior to Visit  Medication Sig Dispense Refill   albuterol (VENTOLIN HFA) 108 (90 Base) MCG/ACT inhaler TAKE 2 PUFFS BY MOUTH EVERY 6 HOURS AS NEEDED FOR WHEEZE OR SHORTNESS OF BREATH 8.5 each 5   Continuous Blood Gluc Receiver (FREESTYLE LIBRE 14 DAY READER) DEVI 1 Units by Does not apply route 2 (two) times daily. H35.30, E11.65 1 each 2   Continuous Blood Gluc Sensor (FREESTYLE LIBRE 14 DAY SENSOR) MISC Apply 1 sensor every 14 days E11.319 2 each 11   Dulaglutide (TRULICITY) 8.31 DV/7.6HY SOPN INJECT 0.75 MG INTO THE SKIN ONCE A WEEK. 6 mL 3   Elastic Bandages & Supports (MEDICAL COMPRESSION THIGH HIGH) MISC 1 each by Does not apply route daily. 2 each 0   famotidine (PEPCID) 10 MG tablet Take 10 mg by mouth as needed for heartburn or indigestion.     fexofenadine (ALLEGRA) 60 MG tablet Take 60 mg by mouth daily.     gabapentin (NEURONTIN) 100 MG capsule Take 1 capsule (100 mg total) by mouth 3 (three) times daily.  APPOINTMENT OVERDUE 90 capsule 0   metFORMIN (GLUCOPHAGE) 1000 MG tablet Take 1 tablet (1,000 mg total) by mouth 2 (two) times daily with a meal. 180 tablet 3   [DISCONTINUED] cetirizine (ZYRTEC) 10 MG tablet Take 10 mg by mouth daily as needed for allergies.     [DISCONTINUED] ranitidine (ZANTAC) 150 MG tablet Take 150 mg by mouth daily as needed for heartburn.     No current facility-administered medications on file prior to visit.    Past Medical History:  Diagnosis Date   Allergy     Cancer ([place])    endometrial   Cataract    Diabetes mellitus without complication ([place])    type 2   GERD (gastroesophageal reflux disease)    High cholesterol    Jaundice    as a child   Macular degeneration    Nuclear sclerotic cataract of both eyes 09/05/2020   Varicose veins of lower extremity    right leg   Allergies  Allergen Reactions   Codeine Other (See Comments)    Causes lip burning and hyperactivity    Family History  Problem Relation Age of Onset   Cancer Mother        colon/stomach   Heart disease Father    Heart attack Sister    Cancer Nephew        bile duct    Social History   Socioeconomic History   Marital status: Widowed    Spouse name: Not on file   Number of children: 1   Years of education: Not on file   Highest education level: Not on file  Occupational History   Occupation: retired  Tobacco Use   Smoking status: Former    Packs/day: 0.50    Types: Cigarettes    Quit date: 03/26/2009    Years since quitting: 13.0   Smokeless tobacco: Never  Vaping Use   Vaping Use: Never used  Substance and Sexual Activity   Alcohol use: No   Drug use: No   Sexual activity: Not Currently  Other Topics Concern   Not on file  Social History Narrative   3 Step Daughters/lives with Daughter(Pam Bouwer) and son-in-law.   Social Determinants of Health   Financial Resource Strain: Not on file  Food Insecurity: Not on file  Transportation Needs: Not on file  Physical Activity: Not on file  Stress: Not on file  Social Connections: Not on file   Vitals:   04/15/22 1412  BP: 136/70  Pulse: 90  Resp: 16  Temp: 98.2 F (36.8 C)  SpO2: 97%  Body mass index is 28.56 kg/m.  Physical Exam Vitals and nursing note reviewed.  Constitutional:      General: She is not in acute distress.    Appearance: She is well-developed.  HENT:     Head: Normocephalic and atraumatic.     Mouth/Throat:     Mouth: Mucous membranes are moist.     Pharynx:  Oropharynx is clear.  Eyes:     Conjunctiva/sclera: Conjunctivae normal.  Cardiovascular:     Rate and Rhythm: Normal rate and regular rhythm.     Pulses:          Dorsalis pedis pulses are 2+ on the right side and 2+ on the left side.     Heart sounds: No murmur heard. Pulmonary:     Effort: Pulmonary effort is normal. No respiratory distress.     Breath sounds: Normal breath sounds.  Abdominal:  Palpations: Abdomen is soft. There is no hepatomegaly or mass.     Tenderness: There is no abdominal tenderness.  Skin:    General: Skin is warm.     Findings: No erythema or rash.  Neurological:     General: No focal deficit present.     Mental Status: She is alert and oriented to person, place, and time.     Cranial Nerves: No cranial nerve deficit.     Comments: Gait assisted with a cane.  Psychiatric:        Mood and Affect: Mood and affect normal.   ASSESSMENT AND PLAN:  Ms.Ceola was seen today for establish care.  Diagnoses and all orders for this visit: Orders Placed This Encounter  Procedures   Comprehensive metabolic panel   Microalbumin / creatinine urine ratio   Hemoglobin A1c   Fructosamine   Lab Results  Component Value Date   CREATININE 0.91 04/15/2022   BUN 15 04/15/2022   NA 139 04/15/2022   K 4.7 04/15/2022   CL 101 04/15/2022   CO2 29 04/15/2022   Lab Results  Component Value Date   ALT 13 04/15/2022   AST 14 04/15/2022   ALKPHOS 47 04/15/2022   BILITOT 0.4 04/15/2022   Lab Results  Component Value Date   HGBA1C 8.3 (H) 04/15/2022   Hyperlipidemia associated with type 2 diabetes mellitus (HCC) LDL was 52 in 12/2020. She is not on pharmacologic treatment. For now continue low fat diet. She is not fasting today, we will plan on re-checking next visit.  Type 2 diabetes mellitus with diabetic neuropathy, unspecified ([place]) HgA1C was 8.0 in 12/2020. Appropriate goal < 8.0. Continue Metformin and Trulicity same dose. Annual eye exam, periodic  dental and foot care recommended. F/U in 5-6 months   Essential hypertension BP adequately controlled. Continue non pharmacologic treatment. Eye exam current.  Incontinence of feces with fecal urgency Chronic. She also has urine stress incontinence.  We discussed prognosis and treatment options. I think she will benefit from pelvic floor exercises, PT is an option but she would like to hold on referral for now. Information of AVS given.  Endometrial cancer (Post) S/P robotic hysterectomy and bilateral oophorectomy. Following with gyn oncologist.  I spent a total of 55 minutes in both face to face and non face to face activities for this visit on the date of this encounter. During this time history was obtained and documented, examination was performed, prior labs reviewed, and assessment/plan discussed.  Return in about 6 months (around 10/14/2022).  Yahye Siebert G. Martinique, MD  [facility]. [place] office.

## 2022-04-15 ENCOUNTER — Ambulatory Visit (INDEPENDENT_AMBULATORY_CARE_PROVIDER_SITE_OTHER): Payer: Medicare HMO | Admitting: Family Medicine

## 2022-04-15 ENCOUNTER — Encounter: Payer: Self-pay | Admitting: Family Medicine

## 2022-04-15 VITALS — BP 136/70 | HR 90 | Temp 98.2°F | Resp 16 | Ht 64.0 in | Wt 166.4 lb

## 2022-04-15 DIAGNOSIS — R159 Full incontinence of feces: Secondary | ICD-10-CM | POA: Diagnosis not present

## 2022-04-15 DIAGNOSIS — C541 Malignant neoplasm of endometrium: Secondary | ICD-10-CM | POA: Diagnosis not present

## 2022-04-15 DIAGNOSIS — I10 Essential (primary) hypertension: Secondary | ICD-10-CM | POA: Diagnosis not present

## 2022-04-15 DIAGNOSIS — E1169 Type 2 diabetes mellitus with other specified complication: Secondary | ICD-10-CM | POA: Diagnosis not present

## 2022-04-15 DIAGNOSIS — E785 Hyperlipidemia, unspecified: Secondary | ICD-10-CM | POA: Diagnosis not present

## 2022-04-15 DIAGNOSIS — R152 Fecal urgency: Secondary | ICD-10-CM

## 2022-04-15 DIAGNOSIS — E1165 Type 2 diabetes mellitus with hyperglycemia: Secondary | ICD-10-CM

## 2022-04-15 DIAGNOSIS — E1140 Type 2 diabetes mellitus with diabetic neuropathy, unspecified: Secondary | ICD-10-CM

## 2022-04-15 LAB — COMPREHENSIVE METABOLIC PANEL
ALT: 13 U/L (ref 0–35)
AST: 14 U/L (ref 0–37)
Albumin: 4.1 g/dL (ref 3.5–5.2)
Alkaline Phosphatase: 47 U/L (ref 39–117)
BUN: 15 mg/dL (ref 6–23)
CO2: 29 mEq/L (ref 19–32)
Calcium: 9.4 mg/dL (ref 8.4–10.5)
Chloride: 101 mEq/L (ref 96–112)
Creatinine, Ser: 0.91 mg/dL (ref 0.40–1.20)
GFR: 58.11 mL/min — ABNORMAL LOW (ref >60.00)
Glucose, Bld: 151 mg/dL — ABNORMAL HIGH (ref 70–99)
Potassium: 4.7 mEq/L (ref 3.5–5.1)
Sodium: 139 mEq/L (ref 135–145)
Total Bilirubin: 0.4 mg/dL (ref 0.2–1.2)
Total Protein: 7.5 g/dL (ref 6.0–8.3)

## 2022-04-15 LAB — HEMOGLOBIN A1C: Hgb A1c MFr Bld: 8.3 % — ABNORMAL HIGH (ref 4.6–6.5)

## 2022-04-15 NOTE — Patient Instructions (Addendum)
A few things to remember from today's visit:  Uncontrolled type 2 diabetes mellitus with hyperglycemia ([place]) - Plan: Comprehensive metabolic panel, Microalbumin / creatinine urine ratio, Hemoglobin A1c, Fructosamine  Essential hypertension  Incontinence of feces with fecal urgency  If you need refills for medications you take chronically, please call your pharmacy. Do not use My Chart to request refills or for acute issues that need immediate attention. If you send a my chart message, it may take a few days to be addressed, specially if I am not in the office.  Please be sure medication list is accurate. If a new problem present, please set up appointment sooner than planned today.  Pelvic Floor Exercises for Bowel Control  Exercises using both the external anal sphincter and the deep pelvic floor muscles can help you to improve your bowel control. When done correctly, these exercises can tone and strengthen the muscles to help you hold back gas and prevent fecal incontinence (leakage of stool). Exercise programs take time; you may not see any noticeable change in your bowel control immediately.  In some cases it may take several months to regain control.  Bowel Control Muscles The anus and the anal canal, has rings of muscle around it. The outer ring of muscle is called the external anal sphincter; it is a voluntary muscle which you can learn to tighten and close more efficiently. When you contract it you will feel the skin around your anus tighten and pull in as if the anus is winking. Try to keep the buttocks muscles relaxed. The inner ring around the anus is the internal anal sphincter. It is an involuntary and automatic muscle; you don't have to think to keep it closed or open.  This muscle should be closed at all times, except when you are actually trying to have a bowel movement.  In addition to the sphincter muscles, there are deeper muscles called the levator ani that form a sling from  your tailbone to your pubic bone. The levator ani muscle has a specific part called the puborectalis that holds stool in until you give the signal to relax and empty.  When you contract these muscles it creates a feeling of lifting the anus inward.  External Anal Sphincter        Levator ani deep layer   Effective Exercises for Control of Gas and Bowels Identify the specific areas of the pelvic floor muscles you need to use.  This can be done using a mirror to see if you are contracting the correct muscles or by placing the pad of your finger at or just inside the anal opening. Develop an exercise plan for strength, endurance and quick response of the muscles and stick with it.  You must make the muscles do more than they are used to doing. Incorporate the exercises into your daily activities.   2007, [employer] Doc.36

## 2022-04-15 NOTE — Assessment & Plan Note (Addendum)
HgA1C was 8.0 in 12/2020. Appropriate goal < 8.0. Continue Metformin and Trulicity same dose. Annual eye exam, periodic dental and foot care recommended. F/U in 5-6 months

## 2022-04-15 NOTE — Assessment & Plan Note (Addendum)
LDL was 52 in 12/2020. She is not on pharmacologic treatment. For now continue low fat diet. She is not fasting today, we will plan on re-checking next visit.

## 2022-04-15 NOTE — Assessment & Plan Note (Signed)
BP adequately controlled. Continue non pharmacologic treatment. Eye exam current.

## 2022-04-16 ENCOUNTER — Other Ambulatory Visit: Payer: Self-pay

## 2022-04-16 LAB — MICROALBUMIN / CREATININE URINE RATIO
Creatinine,U: 133.6 mg/dL
Microalb Creat Ratio: 2 mg/g (ref 0.0–30.0)
Microalb, Ur: 2.7 mg/dL — ABNORMAL HIGH (ref 0.0–1.9)

## 2022-04-16 MED ORDER — TRULICITY 1.5 MG/0.5ML ~~LOC~~ SOAJ: 1.5000 mg | SUBCUTANEOUS | 3 refills | Status: DC

## 2022-04-19 LAB — FRUCTOSAMINE: Fructosamine: 303 umol/L — ABNORMAL HIGH (ref 205–285)

## 2022-05-15 ENCOUNTER — Other Ambulatory Visit: Payer: Self-pay | Admitting: Family

## 2022-05-15 DIAGNOSIS — G629 Polyneuropathy, unspecified: Secondary | ICD-10-CM

## 2022-05-27 ENCOUNTER — Telehealth: Payer: Self-pay | Admitting: Family Medicine

## 2022-05-27 DIAGNOSIS — R152 Fecal urgency: Secondary | ICD-10-CM

## 2022-05-27 DIAGNOSIS — N393 Stress incontinence (female) (male): Secondary | ICD-10-CM

## 2022-05-27 NOTE — Telephone Encounter (Signed)
Pt called to say everything she eats is going right through her and is requesting a referral to see a GI doc, as per discussion had on 04/15/22.  Please advise.  Please call Pt's daughter with GI info:  [phone] Reuben Likes

## 2022-05-27 NOTE — Telephone Encounter (Signed)
Referral placed.

## 2022-05-28 ENCOUNTER — Encounter: Payer: Self-pay | Admitting: Internal Medicine

## 2022-05-29 ENCOUNTER — Encounter (INDEPENDENT_AMBULATORY_CARE_PROVIDER_SITE_OTHER): Payer: Medicare HMO | Admitting: Ophthalmology

## 2022-05-31 ENCOUNTER — Other Ambulatory Visit: Payer: Self-pay | Admitting: Family

## 2022-05-31 DIAGNOSIS — E1165 Type 2 diabetes mellitus with hyperglycemia: Secondary | ICD-10-CM

## 2022-05-31 DIAGNOSIS — G629 Polyneuropathy, unspecified: Secondary | ICD-10-CM

## 2022-05-31 DIAGNOSIS — H3530 Unspecified macular degeneration: Secondary | ICD-10-CM

## 2022-06-02 ENCOUNTER — Ambulatory Visit: Payer: Medicare HMO | Admitting: Internal Medicine

## 2022-06-02 ENCOUNTER — Encounter: Payer: Self-pay | Admitting: Internal Medicine

## 2022-06-02 VITALS — BP 140/80 | HR 93 | Ht 64.0 in | Wt 167.4 lb

## 2022-06-02 DIAGNOSIS — R140 Abdominal distension (gaseous): Secondary | ICD-10-CM | POA: Diagnosis not present

## 2022-06-02 DIAGNOSIS — R194 Change in bowel habit: Secondary | ICD-10-CM | POA: Diagnosis not present

## 2022-06-02 DIAGNOSIS — R159 Full incontinence of feces: Secondary | ICD-10-CM

## 2022-06-02 NOTE — Patient Instructions (Signed)
_______________________________________________________  If you are age 84 or older, your body mass index should be between 23-30. Your Body mass index is 28.73 kg/m. If this is out of the aforementioned range listed, please consider follow up with your Primary Care Provider.  If you are age 50 or younger, your body mass index should be between 19-25. Your Body mass index is 28.73 kg/m. If this is out of the aformentioned range listed, please consider follow up with your Primary Care Provider.   ________________________________________________________  The [place] GI providers would like to encourage you to use [facility] to communicate with providers for non-urgent requests or questions.  Due to long hold times on the telephone, sending your provider a message by [facility] may be a faster and more efficient way to get a response.  Please allow 48 business hours for a response.  Please remember that this is for non-urgent requests.  _______________________________________________________   Discontinue Metformin

## 2022-06-02 NOTE — Progress Notes (Signed)
HISTORY OF PRESENT ILLNESS:  Barbara Golden is a 86 y.o. 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  She presents today at the request of her primary care provider regarding problems with intermittent urgency with loose stools and fecal incontinence.  She is accompanied by her daughter.  The patient denies prior GI evaluations or prior colonoscopy.  She reports that this problem has been going on for several years.  She typically has 1 possibly 2 formed bowel movements per day.  However, approximately once or twice per week she will have urgency with loose stools and incontinence.  Food does not seem to exacerbate the problem.  She has not had nocturnal episodes.  Her weight has been stable.  No bleeding.  She does mention some bloating.  She tells me that she has been on metformin for about 3 years.  She has not tried to come off metformin to see if this makes any difference.  She has occasionally used antidiarrheal such as Imodium.  She has not been using protective undergarments.  Review of blood work from April 15, 2022 shows normal liver test.  Hemoglobin A1c 8.3.  She did recently have her Trulicity increased.  Associated with this has been some nausea  REVIEW OF SYSTEMS:  All non-GI ROS negative unless otherwise stated in the HPI except for back pain  Past Medical History:  Diagnosis Date   Allergy    Cancer ([place])    endometrial   Cataract    Diabetes mellitus without complication ([place])    type 2   GERD (gastroesophageal reflux disease)    High cholesterol    Jaundice    as a child   Macular degeneration    Nuclear sclerotic cataract of both eyes 09/05/2020   Varicose veins of lower extremity    right leg    Past Surgical History:  Procedure Laterality Date   ROBOTIC ASSISTED TOTAL HYSTERECTOMY WITH BILATERAL SALPINGO OOPHERECTOMY Bilateral 09/11/2020   Procedure: XI ROBOTIC ASSISTED TOTAL HYSTERECTOMY WITH BILATERAL SALPINGO  OOPHORECTOMY;  Surgeon: Everitt Amber, MD;  Location: [facility];  Service: Gynecology;  Laterality: Bilateral;   SENTINEL NODE BIOPSY N/A 09/11/2020   Procedure: SENTINEL NODE BIOPSY;  Surgeon: Everitt Amber, MD;  Location: [facility];  Service: Gynecology;  Laterality: N/A;   TUBAL LIGATION Bilateral     Social History Barbara Golden  reports that she quit smoking about 13 years ago. Her smoking use included cigarettes. She smoked an average of .5 packs per day. She has never used smokeless tobacco. She reports that she does not drink alcohol and does not use drugs.  family history includes Cancer in her mother and nephew; Colon cancer in her mother; Heart attack in her sister; Heart disease in her father.  Allergies  Allergen Reactions   Codeine Other (See Comments)    Causes lip burning and hyperactivity       PHYSICAL EXAMINATION: Vital signs: BP (!) 140/80   Pulse 93   Ht '5\' 4"'$  (1.626 m)   Wt 167 lb 6.4 oz (75.9 kg)   LMP 07/21/1998 (Approximate)   SpO2 96%   BMI 28.73 kg/m   Constitutional: generally well-appearing, no acute distress Psychiatric: alert and oriented x3, cooperative Eyes: extraocular movements intact, anicteric, conjunctiva pink Mouth: oral pharynx moist, no lesions Neck: supple no lymphadenopathy Cardiovascular: heart regular rate and rhythm, no murmur Lungs: clear to auscultation bilaterally Abdomen: soft, nontender, nondistended, no obvious ascites, no peritoneal signs, normal  bowel sounds, no organomegaly Rectal: Omitted Extremities: no clubbing, cyanosis, or lower extremity edema bilaterally Skin: no lesions on visible extremities Neuro: No focal deficits.  Cranial nerves intact  ASSESSMENT:  1.  Intermittent problems with urgency, loose stools, and incontinence.  I am suspicious that this may be related to her metformin.  Of course, multiple other possible etiologies.  In addition, Trulicity can have a number of GI side effects.  Recent increased dosage  associated with nausea, seemingly. 2.  Diabetes 3.  Bloating without alarm features  PLAN:  1.  I like her to hold metformin till I see her in follow-up in 4 weeks.  She needs to monitor her blood sugars carefully during this time..  She needs to communicate with her PCP if there is concern over blood sugar values off metformin.  If her problem persist off metformin, she can return to using drug and we will investigate other potential causes.  They understand.  I will send her PCP, Dr. Martinique note today.  I will see her back in the office December 14  A total time of 45 minutes was spent preparing to see the patient, reviewing outside data, obtaining comprehensive history, performing medically appropriate physical examination, counseling and educating the patient as well as her daughter regarding her above this issue, adjusting medical therapy, arranging follow-up, communicating with her PCP, and documenting clinical information in the health record.

## 2022-06-03 ENCOUNTER — Telehealth: Payer: Self-pay | Admitting: Family Medicine

## 2022-06-03 DIAGNOSIS — G629 Polyneuropathy, unspecified: Secondary | ICD-10-CM

## 2022-06-03 MED ORDER — GABAPENTIN 100 MG PO CAPS: 100.0000 mg | ORAL_CAPSULE | Freq: Three times a day (TID) | ORAL | 3 refills | Status: DC

## 2022-06-03 NOTE — Telephone Encounter (Signed)
Rx sent in

## 2022-06-03 NOTE — Telephone Encounter (Signed)
Daughter stated that patient needs a refill on her Gabapentin sent to her pharmacy.   Pharmacy- [facility] on [address].

## 2022-06-04 ENCOUNTER — Telehealth: Payer: Self-pay | Admitting: Family Medicine

## 2022-06-04 NOTE — Telephone Encounter (Signed)
We can try Jardiance 10 mg daily and increase to 25 mg in 30 days if well tolerated. Thanks, BJ

## 2022-06-04 NOTE — Telephone Encounter (Signed)
I called and spoke with patient's daughter. They will come by and pick up the Jardiance samples for pt to try.

## 2022-06-04 NOTE — Telephone Encounter (Signed)
Pt's daughter states patient's GI took her off of Metformin to give her GI Tract a break from it.  While monitoring patient's sugar levels, last night her sugar was 247 and then today it has been 176 and 206.  Patient and daughter are wondering what they need to do.  They are requesting a call back.

## 2022-06-13 ENCOUNTER — Other Ambulatory Visit: Payer: Self-pay | Admitting: Family

## 2022-06-13 DIAGNOSIS — H3530 Unspecified macular degeneration: Secondary | ICD-10-CM

## 2022-06-13 DIAGNOSIS — E1165 Type 2 diabetes mellitus with hyperglycemia: Secondary | ICD-10-CM

## 2022-06-18 ENCOUNTER — Telehealth: Payer: Self-pay | Admitting: Family Medicine

## 2022-06-18 DIAGNOSIS — E1165 Type 2 diabetes mellitus with hyperglycemia: Secondary | ICD-10-CM

## 2022-06-18 DIAGNOSIS — H3530 Unspecified macular degeneration: Secondary | ICD-10-CM

## 2022-06-18 MED ORDER — FREESTYLE LIBRE 14 DAY SENSOR MISC: 11 refills | Status: DC

## 2022-06-18 NOTE — Telephone Encounter (Signed)
Requesting refill Continuous Blood Gluc Sensor (FREESTYLE LIBRE 14 DAY SENSOR) MISC  Barbara Golden is not the original prescriber. Says they were not happy with the original provider and asking if Barbara Golden will fill

## 2022-06-18 NOTE — Telephone Encounter (Signed)
Rx sent in

## 2022-06-25 ENCOUNTER — Encounter: Payer: Self-pay | Admitting: Family Medicine

## 2022-06-25 DIAGNOSIS — E119 Type 2 diabetes mellitus without complications: Secondary | ICD-10-CM | POA: Diagnosis not present

## 2022-06-25 DIAGNOSIS — H524 Presbyopia: Secondary | ICD-10-CM | POA: Diagnosis not present

## 2022-06-25 DIAGNOSIS — H353132 Nonexudative age-related macular degeneration, bilateral, intermediate dry stage: Secondary | ICD-10-CM | POA: Diagnosis not present

## 2022-06-25 DIAGNOSIS — Z961 Presence of intraocular lens: Secondary | ICD-10-CM | POA: Diagnosis not present

## 2022-06-25 LAB — HM DIABETES EYE EXAM

## 2022-07-01 ENCOUNTER — Telehealth: Payer: Self-pay | Admitting: Family Medicine

## 2022-07-01 MED ORDER — EMPAGLIFLOZIN 25 MG PO TABS: 25.0000 mg | ORAL_TABLET | Freq: Every day | ORAL | 3 refills | Status: DC

## 2022-07-01 NOTE — Telephone Encounter (Signed)
Rx sent in to pharmacy. 

## 2022-07-01 NOTE — Telephone Encounter (Signed)
Pt given samples of Jardiance, but only has 2 left.  Pt is asking if MD can send a prescription to the pharmacy, if MD wants her to continue taking them.  Please advise.  LOV:  04/15/22   [id number]- [place], [place] - [address] Phone: [phone] Fax: [phone]

## 2022-07-02 ENCOUNTER — Ambulatory Visit: Payer: Medicare HMO | Admitting: Internal Medicine

## 2022-07-03 ENCOUNTER — Ambulatory Visit: Payer: Medicare HMO | Admitting: Internal Medicine

## 2022-07-03 ENCOUNTER — Encounter: Payer: Self-pay | Admitting: Internal Medicine

## 2022-07-03 VITALS — BP 150/50 | HR 90 | Ht 65.0 in | Wt 163.8 lb

## 2022-07-03 DIAGNOSIS — R159 Full incontinence of feces: Secondary | ICD-10-CM

## 2022-07-03 DIAGNOSIS — R197 Diarrhea, unspecified: Secondary | ICD-10-CM | POA: Diagnosis not present

## 2022-07-03 NOTE — Patient Instructions (Signed)
_______________________________________________________  If you are age 84 or older, your body mass index should be between 23-30. Your Body mass index is 27.26 kg/m. If this is out of the aforementioned range listed, please consider follow up with your Primary Care Provider.  If you are age 82 or younger, your body mass index should be between 19-25. Your Body mass index is 27.26 kg/m. If this is out of the aformentioned range listed, please consider follow up with your Primary Care Provider.   ________________________________________________________  The [place] [facility] providers would like to encourage you to use [facility] to communicate with providers for non-urgent requests or questions.  Due to long hold times on the telephone, sending your provider a message by [facility] may be a faster and more efficient way to get a response.  Please allow 48 business hours for a response.  Please remember that this is for non-urgent requests.  _______________________________________________________  Please follow up as needed

## 2022-07-03 NOTE — Progress Notes (Signed)
HISTORY OF PRESENT ILLNESS:  Barbara Golden is a 86 y.o. female who was evaluated June 02, 2022 regarding intermittent loose stools with incontinence.  She had education.  Her problems felt secondary to performance.  She was told to stop metformin.  She did.  She is now on Jardiance.  She is here for follow-up as requested.  Patient and her daughter report that she has had no further problems with explosive loose stools or incontinence.  They are pleased.  No new complaints.  REVIEW OF SYSTEMS:  All non-GI ROS negative.  Past Medical History:  Diagnosis Date   Allergy    Cancer ([place])    endometrial   Cataract    Diabetes mellitus without complication ([place])    type 2   GERD (gastroesophageal reflux disease)    High cholesterol    Jaundice    as a child   Macular degeneration    Nuclear sclerotic cataract of both eyes 09/05/2020   Varicose veins of lower extremity    right leg    Past Surgical History:  Procedure Laterality Date   ROBOTIC ASSISTED TOTAL HYSTERECTOMY WITH BILATERAL SALPINGO OOPHERECTOMY Bilateral 09/11/2020   Procedure: XI ROBOTIC ASSISTED TOTAL HYSTERECTOMY WITH BILATERAL SALPINGO OOPHORECTOMY;  Surgeon: Everitt Amber, MD;  Location: [facility];  Service: Gynecology;  Laterality: Bilateral;   SENTINEL NODE BIOPSY N/A 09/11/2020   Procedure: SENTINEL NODE BIOPSY;  Surgeon: Everitt Amber, MD;  Location: [facility];  Service: Gynecology;  Laterality: N/A;   TUBAL LIGATION Bilateral     Social History ARIBA LEHNEN  reports that she quit smoking about 13 years ago. Her smoking use included cigarettes. She smoked an average of .5 packs per day. She has never used smokeless tobacco. She reports that she does not drink alcohol and does not use drugs.  family history includes Cancer in her mother and nephew; Colon cancer in her mother; Heart attack in her sister; Heart disease in her father.  Allergies  Allergen Reactions   Codeine Other (See Comments)    Causes lip burning  and hyperactivity       PHYSICAL EXAMINATION: Vital signs: BP (!) 150/50   Pulse 90   Ht '5\' 5"'$  (1.651 m)   Wt 163 lb 12.8 oz (74.3 kg)   LMP 07/21/1998 (Approximate)   SpO2 96%   BMI 27.26 kg/m   Constitutional: generally well-appearing, no acute distress Psychiatric: alert and oriented x3, cooperative Eyes: extraocular movements intact, anicteric, conjunctiva pink Mouth: oral pharynx moist, no lesions Neck: supple no lymphadenopathy Cardiovascular: heart regular rate and rhythm, no murmur Lungs: clear to auscultation bilaterally Abdomen: soft, nontender, nondistended, no obvious ascites, no peritoneal signs, normal bowel sounds, no organomegaly Rectal: Omitted Extremities: no clubbing, cyanosis, or lower extremity edema bilaterally Skin: no lesions on visible extremities Neuro: No focal deficits.  Cranial nerves intact  ASSESSMENT:  1.  Intermittent problems with loose stools and incontinence secondary to metformin.  Resolved after discontinuing metformin   PLAN:  1.  Return to the care of their primary provider.  GI follow-up as needed

## 2022-09-03 DIAGNOSIS — H524 Presbyopia: Secondary | ICD-10-CM | POA: Diagnosis not present

## 2022-09-03 DIAGNOSIS — H52223 Regular astigmatism, bilateral: Secondary | ICD-10-CM | POA: Diagnosis not present

## 2022-10-14 ENCOUNTER — Other Ambulatory Visit: Payer: Self-pay

## 2022-10-14 MED ORDER — SEMAGLUTIDE (1 MG/DOSE) 4 MG/3ML ~~LOC~~ SOPN: 1.0000 mg | PEN_INJECTOR | SUBCUTANEOUS | 3 refills | Status: DC

## 2022-10-22 ENCOUNTER — Encounter: Payer: Self-pay | Admitting: Family Medicine

## 2022-10-22 ENCOUNTER — Ambulatory Visit (INDEPENDENT_AMBULATORY_CARE_PROVIDER_SITE_OTHER): Payer: Medicare HMO | Admitting: Family Medicine

## 2022-10-22 ENCOUNTER — Other Ambulatory Visit: Payer: Self-pay | Admitting: Family Medicine

## 2022-10-22 VITALS — BP 138/70 | HR 97 | Temp 98.0°F | Resp 16 | Ht 65.0 in | Wt 155.8 lb

## 2022-10-22 DIAGNOSIS — C541 Malignant neoplasm of endometrium: Secondary | ICD-10-CM

## 2022-10-22 DIAGNOSIS — Y92009 Unspecified place in unspecified non-institutional (private) residence as the place of occurrence of the external cause: Secondary | ICD-10-CM

## 2022-10-22 DIAGNOSIS — W19XXXA Unspecified fall, initial encounter: Secondary | ICD-10-CM | POA: Diagnosis not present

## 2022-10-22 DIAGNOSIS — I10 Essential (primary) hypertension: Secondary | ICD-10-CM

## 2022-10-22 DIAGNOSIS — R197 Diarrhea, unspecified: Secondary | ICD-10-CM | POA: Diagnosis not present

## 2022-10-22 DIAGNOSIS — R5383 Other fatigue: Secondary | ICD-10-CM | POA: Diagnosis not present

## 2022-10-22 DIAGNOSIS — E1165 Type 2 diabetes mellitus with hyperglycemia: Secondary | ICD-10-CM

## 2022-10-22 DIAGNOSIS — E1140 Type 2 diabetes mellitus with diabetic neuropathy, unspecified: Secondary | ICD-10-CM

## 2022-10-22 LAB — POCT GLYCOSYLATED HEMOGLOBIN (HGB A1C): Hemoglobin A1C: 9.5 % — AB (ref 4.0–5.6)

## 2022-10-22 MED ORDER — TRULICITY 3 MG/0.5ML ~~LOC~~ SOAJ: 3.0000 mg | SUBCUTANEOUS | 2 refills | Status: DC

## 2022-10-22 NOTE — Patient Instructions (Addendum)
A few things to remember from today's visit:  Endometrial cancer - Plan: Ambulatory referral to Gynecology  Uncontrolled type 2 diabetes mellitus with hyperglycemia - Plan: POC HgB A1c, Dulaglutide (TRULICITY) 3 0000000 SOPN  Fatigue, unspecified type  Essential hypertension  Diarrhea, unspecified type  Stop Ozempic. Start Trulicity 3 mg weekly. No changes in [place]. Labs later this week or next week.  If you need refills for medications you take chronically, please call your pharmacy. Do not use My Chart to request refills or for acute issues that need immediate attention. If you send a my chart message, it may take a few days to be addressed, specially if I am not in the office.  Please be sure medication list is accurate. If a new problem present, please set up appointment sooner than planned today.

## 2022-10-22 NOTE — Progress Notes (Unsigned)
ACUTE VISIT Chief Complaint  Patient presents with   Fatigue   Fall   Diarrhea   HPI: Barbara Golden is a 87 y.o. female with PMHx significant for DM II,HLD,macular degeneration, back pain, varicose vein disease,endometrial cancer, HTN, and neuropathy here today with her daughter complaining of fatigue and a recent fall. She has been experiencing fatigue for approximately one week, which she attributes to her first shot of Ozempic.   Additionally, she reports having had diarrhea on two occasions in the past week. She has seen GI for diarrhea, last seen on 07/03/22. According to her daughter,it was thought to be caused by Metformin.  Negative for melena or abdominal pain.  She denies experiencing any unusual headaches, chest pain, difficulty breathing, palpitations, or blood in her urine or stool. She associates her cough with allergies.  Endometrial cancer: Dx'ed in 2022, s/p robotic assisted total hysterectomy, BSO.  Her daughter states that Barbara Golden gyn oncologist has left the practice,so she needs to establish with another provider, she received a letter with information, not sure about details, she has not tried to contact office.  She has had a fall, both of which occurred in the bathroom, last night. She believes tit was caused by a wet floor and challenges with mobility. She got up with no help, no head trauma or LOC.  Hypertension: She is on non pharmacologic treatment. Not checking BP at home.  Lab Results  Component Value Date   CREATININE 0.91 04/15/2022   BUN 15 04/15/2022   NA 139 04/15/2022   K 4.7 04/15/2022   CL 101 04/15/2022   CO2 29 04/15/2022   Lab Results  Component Value Date   ALT 13 04/15/2022   AST 14 04/15/2022   ALKPHOS 47 04/15/2022   BILITOT 0.4 04/15/2022   Lab Results  Component Value Date   WBC 9.7 01/11/2021   HGB 13.8 01/11/2021   HCT 42.2 01/11/2021   MCV 92.7 01/11/2021   PLT 331 01/11/2021   DM II Dx;ed at age 33. She was  on Trulicity 1.5 mg but due to shortage, Ozempic 1 mg was recommended. She is also on Jardiance 25 mg daily. Her blood sugar levels have been consistently around 179 to 180 before breakfast.  Negative for polydipsia,polyuria, or polyphagia.  Lab Results  Component Value Date   HGBA1C 9.5 (A) 10/22/2022   Review of Systems  Constitutional:  Positive for appetite change. Negative for activity change, chills and fever.  Eyes:  Negative for redness and visual disturbance.  Respiratory:  Negative for wheezing.   Endocrine: Negative for cold intolerance and heat intolerance.  Genitourinary:  Negative for decreased urine volume, dysuria and hematuria.  Skin:  Negative for rash.  Neurological:  Negative for syncope and headaches.  See other pertinent positives and negatives in HPI.  Current Outpatient Medications on File Prior to Visit  Medication Sig Dispense Refill   albuterol (VENTOLIN HFA) 108 (90 Base) MCG/ACT inhaler TAKE 2 PUFFS BY MOUTH EVERY 6 HOURS AS NEEDED FOR WHEEZE OR SHORTNESS OF BREATH (Patient not taking: Reported on 07/03/2022) 8.5 each 5   Continuous Blood Gluc Receiver (FREESTYLE LIBRE 14 DAY READER) DEVI 1 Units by Does not apply route 2 (two) times daily. H35.30, E11.65 1 each 2   Continuous Blood Gluc Sensor (FREESTYLE LIBRE 14 DAY SENSOR) MISC Apply 1 sensor every 14 days E11.319 2 each 11   Elastic Bandages & Supports (MEDICAL COMPRESSION THIGH HIGH) MISC 1 each by Does not apply  route daily. 2 each 0   famotidine (PEPCID) 10 MG tablet Take 10 mg by mouth as needed for heartburn or indigestion.     fexofenadine (ALLEGRA) 60 MG tablet Take 60 mg by mouth daily.     gabapentin (NEURONTIN) 100 MG capsule Take 1 capsule (100 mg total) by mouth 3 (three) times daily. 90 capsule 3   JARDIANCE 25 MG TABS tablet TAKE 1 TABLET BY MOUTH DAILY BEFORE BREAKFAST. 30 tablet 3   [DISCONTINUED] cetirizine (ZYRTEC) 10 MG tablet Take 10 mg by mouth daily as needed for allergies.      [DISCONTINUED] ranitidine (ZANTAC) 150 MG tablet Take 150 mg by mouth daily as needed for heartburn.     No current facility-administered medications on file prior to visit.   Past Medical History:  Diagnosis Date   Allergy    Cancer    endometrial   Cataract    Diabetes mellitus without complication    type 2   GERD (gastroesophageal reflux disease)    High cholesterol    Jaundice    as a child   Macular degeneration    Nuclear sclerotic cataract of both eyes 09/05/2020   Varicose veins of lower extremity    right leg   Allergies  Allergen Reactions   Codeine Other (See Comments)    Causes lip burning and hyperactivity    Social History   Socioeconomic History   Marital status: Widowed    Spouse name: Not on file   Number of children: 1   Years of education: Not on file   Highest education level: Not on file  Occupational History   Occupation: retired  Tobacco Use   Smoking status: Former    Packs/day: .5    Types: Cigarettes    Quit date: 03/26/2009    Years since quitting: 13.5   Smokeless tobacco: Never  Vaping Use   Vaping Use: Never used  Substance and Sexual Activity   Alcohol use: No   Drug use: No   Sexual activity: Not Currently  Other Topics Concern   Not on file  Social History Narrative   3 Step Daughters/lives with Daughter(Pam Bouwer) and son-in-law.   Social Determinants of Health   Financial Resource Strain: Not on file  Food Insecurity: Not on file  Transportation Needs: Not on file  Physical Activity: Not on file  Stress: Not on file  Social Connections: Not on file   Today's Vitals   10/22/22 1551 10/22/22 1656  BP: (!) 148/62 138/70  Pulse: 97   Resp: 16   Temp: 98 F (36.7 C)   TempSrc: Oral   SpO2: 95%   Weight: 155 lb 12.8 oz (70.7 kg)   Height: 5\' 5"  (1.651 m)    Body mass index is 25.93 kg/m.  Physical Exam Vitals and nursing note reviewed.  Constitutional:      General: She is not in acute distress.     Appearance: She is well-developed.  HENT:     Head: Normocephalic and atraumatic.     Mouth/Throat:     Mouth: Mucous membranes are dry.     Pharynx: Oropharynx is clear.  Eyes:     Conjunctiva/sclera: Conjunctivae normal.  Cardiovascular:     Rate and Rhythm: Normal rate and regular rhythm.     Pulses:          Dorsalis pedis pulses are 2+ on the right side and 2+ on the left side.     Heart sounds: No murmur  heard. Pulmonary:     Effort: Pulmonary effort is normal. No respiratory distress.     Breath sounds: Normal breath sounds.  Abdominal:     Palpations: Abdomen is soft. There is no mass.     Tenderness: There is no abdominal tenderness.  Skin:    General: Skin is warm.     Findings: No erythema or rash.  Neurological:     General: No focal deficit present.     Mental Status: She is alert and oriented to person, place, and time.     Cranial Nerves: No cranial nerve deficit.     Comments: Gait assisted with a cane.  Psychiatric:        Mood and Affect: Mood and affect normal.   ASSESSMENT AND PLAN:  Ms. Peeler was seen today for fatigue and diarrhea. Lab Results  Component Value Date   HGBA1C 9.5 (A) 10/22/2022   Fatigue, unspecified type We discussed possible etiologies. Some of her co-morbilities can also be contributing factors. She thinks it is caused by Ozempic, so discontinued. Further recommendations according to lab results.  -     Basic metabolic panel; Future -     CBC; Future -     TSH; Future  Type 2 diabetes mellitus with diabetic neuropathy, without long-term current use of insulin Assessment & Plan: HgA1C went from 8.3 to 9.5 today. She is not interested in basal insulin. Trulicity 3.0 mg weekly started today. Continue Jardiance 25 mg daily. Annual eye exam, periodic dental and foot care to continue. F/U in 3 months.  Orders: -     POCT glycosylated hemoglobin (Hb 123XX123) -     Trulicity; Inject 3 mg as directed once a week.  Dispense: 6 mL;  Refill: 2 -     Basic metabolic panel; Future  Fall at home, initial encounter Fall precautions discussed.  Essential hypertension Assessment & Plan: Goal BP < 150/90. Continue non pharmacologic treatment. Recommend monitoring BP regularly. Eye exam is current.  Orders: -     CBC; Future -     TSH; Future  Diarrhea, unspecified type 2 episodes last week. If recurrent , she needs to arrange appt with her GI. Adequate hydration. Ozempic could have also caused problem.  Endometrial cancer Assessment & Plan: Follows annually with gyn oncologist. According to daughter, DR Denman George is leaving. Recommend calling her office and inquiring about other providers she can establish with.  Orders: -     Ambulatory referral to Gynecology  I spent a total of 44 minutes in both face to face and non face to face activities for this visit on the date of this encounter. During this time history was obtained and documented, examination was performed, prior labs reviewed, and assessment/plan discussed.  Return in about 3 months (around 01/30/2023) for chronic problems.  Lelan Cush G. [place], MD  [facility]. [place] office.

## 2022-10-23 ENCOUNTER — Telehealth: Payer: Self-pay | Admitting: *Deleted

## 2022-10-23 ENCOUNTER — Encounter: Payer: Self-pay | Admitting: Family Medicine

## 2022-10-23 NOTE — Assessment & Plan Note (Signed)
Goal BP < 150/90. Continue non pharmacologic treatment. Recommend monitoring BP regularly. Eye exam is current.

## 2022-10-23 NOTE — Telephone Encounter (Signed)
Pt scheduled with Dr.Jackson-Moore on 11/19/22 @ 2:00

## 2022-10-23 NOTE — Assessment & Plan Note (Signed)
HgA1C went from 8.3 to 9.5 today. She is not interested in basal insulin. Trulicity 3.0 mg weekly started today. Continue Jardiance 25 mg daily. Annual eye exam, periodic dental and foot care to continue. F/U in 3 months.

## 2022-10-23 NOTE — Telephone Encounter (Signed)
Attempted to reach the patient via her cell and home numbers to schedule a follow up appt with Dr Delsa Sale.  No answer and no voicemail for either line.

## 2022-10-23 NOTE — Assessment & Plan Note (Signed)
Follows annually with gyn oncologist. According to daughter, DR Denman George is leaving. Recommend calling her office and inquiring about other providers she can establish with.

## 2022-10-24 ENCOUNTER — Other Ambulatory Visit (INDEPENDENT_AMBULATORY_CARE_PROVIDER_SITE_OTHER): Payer: Medicare HMO

## 2022-10-24 DIAGNOSIS — R5383 Other fatigue: Secondary | ICD-10-CM | POA: Diagnosis not present

## 2022-10-24 DIAGNOSIS — I10 Essential (primary) hypertension: Secondary | ICD-10-CM

## 2022-10-24 DIAGNOSIS — E1140 Type 2 diabetes mellitus with diabetic neuropathy, unspecified: Secondary | ICD-10-CM

## 2022-10-24 LAB — BASIC METABOLIC PANEL
BUN: 11 mg/dL (ref 6–23)
CO2: 25 mEq/L (ref 19–32)
Calcium: 9.6 mg/dL (ref 8.4–10.5)
Chloride: 102 mEq/L (ref 96–112)
Creatinine, Ser: 0.77 mg/dL (ref 0.40–1.20)
GFR: 70.75 mL/min (ref >60.00)
Glucose, Bld: 194 mg/dL — ABNORMAL HIGH (ref 70–99)
Potassium: 3.9 mEq/L (ref 3.5–5.1)
Sodium: 138 mEq/L (ref 135–145)

## 2022-10-24 LAB — CBC
HCT: 43 % (ref 36.0–46.0)
Hemoglobin: 14.2 g/dL (ref 12.0–15.0)
MCHC: 33.1 g/dL (ref 30.0–36.0)
MCV: 92.4 fl (ref 78.0–100.0)
Platelets: 400 10*3/uL (ref 150.0–400.0)
RBC: 4.66 Mil/uL (ref 3.87–5.11)
RDW: 14 % (ref 11.5–15.5)
WBC: 10.9 10*3/uL — ABNORMAL HIGH (ref 4.0–10.5)

## 2022-10-24 LAB — TSH: TSH: 2.02 u[IU]/mL (ref 0.35–5.50)

## 2022-11-03 ENCOUNTER — Telehealth: Payer: Self-pay | Admitting: Family Medicine

## 2022-11-03 NOTE — Telephone Encounter (Signed)
Called patient to schedule Medicare Annual Wellness Visit (AWV). No voicemail available to leave a message.  Last date of AWV: 10/31/21  Please schedule an appointment at any time with [facility] bevelry or hannah kim.  If any questions, please contact me at [phone].  Thank you ,  Rudell Cobb AWV direct phone # [phone]

## 2022-11-04 ENCOUNTER — Telehealth: Payer: Self-pay | Admitting: Internal Medicine

## 2022-11-04 ENCOUNTER — Encounter: Payer: Medicare HMO | Admitting: Nurse Practitioner

## 2022-11-04 NOTE — Telephone Encounter (Signed)
Supportive measures.  Hydration and low-dose Imodium.  Give it some time.  May be a gastroenteritis

## 2022-11-04 NOTE — Telephone Encounter (Signed)
Spoke with pts daughter and she is aware of Dr. Lamar Sprinkles recommendations. She knows to call back if no improvement.

## 2022-11-04 NOTE — Telephone Encounter (Signed)
Pts daughter called and states her mother had 4 diarrhea stools between 6 and 10 pm last night. Patient reports this started a few days ago but the daughter seems to think it has been going on longer. She is not on metformin now and has not taken anything for the diarrhea. Daughter stated that she remembered Dr. Marina Goodell telling them to call if this happened again. She wants to know what Dr. Marina Goodell recommends.

## 2022-11-04 NOTE — Telephone Encounter (Signed)
Patients daughter Elita Quick called states the patient has been getting he same symptoms as her last visit. A lot of broken down bowels seeking advise.

## 2022-11-11 ENCOUNTER — Telehealth: Payer: Self-pay | Admitting: *Deleted

## 2022-11-11 NOTE — Telephone Encounter (Signed)
Attempted to reach the patient to moved her appt from 5/1 to 6/5, no answer  and no VM

## 2022-11-17 NOTE — Telephone Encounter (Signed)
Attempted to reach the patient to moved her appt from 5/1 to 6/5, no answer and no VM    Sent my chart message to patient

## 2022-11-18 NOTE — Telephone Encounter (Signed)
Spoke to Ms. Kley's daughter, Rinaldo Cloud regarding needing to reschedule pt's appointment.  Pt rescheduled from 5/1 to 6/5 @ 1:45pm.  Rinaldo Cloud agreed to new appointment date/time

## 2022-11-19 ENCOUNTER — Inpatient Hospital Stay: Payer: Medicare HMO | Admitting: Obstetrics & Gynecology

## 2022-11-27 ENCOUNTER — Telehealth: Payer: Self-pay | Admitting: Family Medicine

## 2022-11-27 NOTE — Telephone Encounter (Signed)
Contacted Barbara Golden to schedule their annual wellness visit. Appointment made for 12/05/22.  Rudell Cobb AWV direct phone # [phone]

## 2022-12-03 ENCOUNTER — Other Ambulatory Visit: Payer: Self-pay

## 2022-12-03 ENCOUNTER — Encounter: Payer: Self-pay | Admitting: Family Medicine

## 2022-12-03 ENCOUNTER — Ambulatory Visit (INDEPENDENT_AMBULATORY_CARE_PROVIDER_SITE_OTHER): Payer: Medicare HMO | Admitting: Family Medicine

## 2022-12-03 VITALS — BP 120/62 | HR 87 | Temp 98.2°F | Wt 155.8 lb

## 2022-12-03 DIAGNOSIS — R059 Cough, unspecified: Secondary | ICD-10-CM | POA: Diagnosis not present

## 2022-12-03 DIAGNOSIS — J40 Bronchitis, not specified as acute or chronic: Secondary | ICD-10-CM | POA: Diagnosis not present

## 2022-12-03 LAB — POC COVID19 BINAXNOW: SARS Coronavirus 2 Ag: NEGATIVE

## 2022-12-03 MED ORDER — DOXYCYCLINE HYCLATE 100 MG PO CAPS: 100.0000 mg | ORAL_CAPSULE | Freq: Two times a day (BID) | ORAL | 0 refills | Status: AC

## 2022-12-03 NOTE — Progress Notes (Signed)
   Subjective:    Patient ID: Barbara Golden, female    DOB: 03/29/1936, 87 y.o.   MRN: [id number]  HPI Here for 3 days of a deep dry cough and fatigue. No fever or chest pain or SOB. Using Robitussin DM.    Review of Systems  Constitutional:  Positive for fatigue. Negative for fever.  HENT: Negative.    Eyes: Negative.   Respiratory:  Positive for cough. Negative for shortness of breath and wheezing.        Objective:   Physical Exam Constitutional:      Appearance: Normal appearance.     Comments: Coughing occasionally   HENT:     Right Ear: Tympanic membrane, ear canal and external ear normal.     Left Ear: Tympanic membrane, ear canal and external ear normal.     Nose: Nose normal.     Mouth/Throat:     Pharynx: Oropharynx is clear.  Eyes:     Conjunctiva/sclera: Conjunctivae normal.  Cardiovascular:     Rate and Rhythm: Normal rate and regular rhythm.     Pulses: Normal pulses.     Heart sounds: Normal heart sounds.  Pulmonary:     Effort: Pulmonary effort is normal.     Breath sounds: Rhonchi present. No wheezing or rales.  Lymphadenopathy:     Cervical: No cervical adenopathy.  Neurological:     Mental Status: She is alert.           Assessment & Plan:  Bronchitis, treat with 10 days of Doxycycline.  Gershon Crane, MD

## 2022-12-05 ENCOUNTER — Telehealth: Payer: Self-pay

## 2022-12-05 NOTE — Telephone Encounter (Signed)
Contacted patient on preferred number listed in notes for scheduled AWV. Patient sick and unable to complete visit. Daughter stated she  will call back to reschedule.

## 2022-12-09 ENCOUNTER — Other Ambulatory Visit: Payer: Self-pay | Admitting: Family Medicine

## 2022-12-09 DIAGNOSIS — G629 Polyneuropathy, unspecified: Secondary | ICD-10-CM

## 2022-12-23 NOTE — Progress Notes (Unsigned)
Follow Up Note: Gyn-Onc  Barbara Golden 87 y.o. female  CC: She presents for a follow-up visit   HPI: The oncology history was reviewed.  Interval History: She denies any vaginal bleeding, abdominal/pelvic pain, cough, lethargy or abdominal distention.     Review of Systems  Review of Systems  Constitutional:  Negative for malaise/fatigue and weight loss.  Respiratory:  Negative for shortness of breath and wheezing.   Cardiovascular:  Negative for chest pain and leg swelling.  Gastrointestinal:  Negative for abdominal pain, blood in stool, constipation, nausea and vomiting.  Genitourinary:  Negative for dysuria, frequency, hematuria and urgency.  Musculoskeletal:  Negative for joint pain and myalgias.  Neurological:  Negative for weakness.  Psychiatric/Behavioral:  Negative for depression. The patient does not have insomnia.    Current medications, allergy, social history, past surgical history, past medical history, family history were all reviewed.    Vitals:  Last menstrual period 07/21/1998.  Physical Exam:  Physical Exam Exam conducted with a chaperone present.  Constitutional:      General: She is not in acute distress. Cardiovascular:     Rate and Rhythm: Normal rate and regular rhythm.  Pulmonary:     Effort: Pulmonary effort is normal.     Breath sounds: Normal breath sounds. No wheezing or rhonchi.  Abdominal:     Palpations: Abdomen is soft.     Tenderness: There is no abdominal tenderness. There is no right CVA tenderness or left CVA tenderness.     Hernia: No hernia is present.  Genitourinary:    General: Normal vulva.     Urethra: No urethral lesion.     Vagina: No lesions. No bleeding Musculoskeletal:     Cervical back: Neck supple.     Right lower leg: No edema.     Left lower leg: No edema.  Lymphadenopathy:     Upper Body:     Right upper body: No supraclavicular adenopathy.     Left upper body: No supraclavicular adenopathy.     Lower Body: No  right inguinal adenopathy. No left inguinal adenopathy.  Skin:    Findings: No rash.  Neurological:     Mental Status: She is oriented to person, place, and time.   Assessment/Plan: ***   Antionette Char, MD

## 2022-12-23 NOTE — Assessment & Plan Note (Signed)
[name]  is a 87 y.o. with a stage IA grade 1 endometrial cancer (MMRd). S/p surgical staging on 09/11/20.   Pathology revealed low risk factors for recurrence, therefore no adjuvant therapy is recommended according to NCCN guidelines.   I discussed risk for recurrence and typical symptoms encouraged her to notify us of these should they develop between visits.   I recommend she have follow-up every 6 months for 5 years in accordance with NCCN guidelines. Those visits should include symptom assessment, physical exam and pelvic examination. Pap smears are not indicated or recommended in the routine surveillance of endometrial cancer.

## 2022-12-24 ENCOUNTER — Encounter: Payer: Self-pay | Admitting: Obstetrics & Gynecology

## 2022-12-24 ENCOUNTER — Other Ambulatory Visit: Payer: Self-pay

## 2022-12-24 ENCOUNTER — Inpatient Hospital Stay: Payer: Medicare HMO | Attending: Obstetrics & Gynecology | Admitting: Obstetrics & Gynecology

## 2022-12-24 VITALS — BP 143/60 | HR 92 | Temp 97.7°F | Resp 20 | Ht 65.35 in | Wt 156.2 lb

## 2022-12-24 DIAGNOSIS — Z8542 Personal history of malignant neoplasm of other parts of uterus: Secondary | ICD-10-CM | POA: Diagnosis not present

## 2022-12-24 DIAGNOSIS — C541 Malignant neoplasm of endometrium: Secondary | ICD-10-CM

## 2023-01-23 ENCOUNTER — Telehealth: Payer: Self-pay | Admitting: *Deleted

## 2023-01-23 NOTE — Telephone Encounter (Signed)
Attempted to reach the patient to schedule a follow up appt with Dr Tamela Oddi in Dec; no answer and no voicemail (both numbers)

## 2023-01-27 NOTE — Telephone Encounter (Signed)
Attempted to reach the patient to schedule a follow up appt with Dr Tamela Oddi in Dec; no answer and no voicemail (both numbers)

## 2023-01-28 NOTE — Telephone Encounter (Signed)
Spoke with the patient  and scheduled a follow up appt for 12/11 at 2 pm

## 2023-02-14 ENCOUNTER — Other Ambulatory Visit: Payer: Self-pay | Admitting: Family Medicine

## 2023-03-03 ENCOUNTER — Encounter: Payer: Self-pay | Admitting: Family Medicine

## 2023-03-03 ENCOUNTER — Telehealth (INDEPENDENT_AMBULATORY_CARE_PROVIDER_SITE_OTHER): Payer: Medicare HMO | Admitting: Family Medicine

## 2023-03-03 VITALS — Ht 64.0 in | Wt 150.0 lb

## 2023-03-03 DIAGNOSIS — Z0000 Encounter for general adult medical examination without abnormal findings: Secondary | ICD-10-CM

## 2023-03-03 DIAGNOSIS — Z13820 Encounter for screening for osteoporosis: Secondary | ICD-10-CM

## 2023-03-03 NOTE — Patient Instructions (Signed)
I really enjoyed getting to talk with you today! I am available on Tuesdays and Thursdays for virtual visits if you have any questions or concerns, or if I can be of any further assistance.   CHECKLIST FROM ANNUAL WELLNESS VISIT:  -Follow up (please call to schedule if not scheduled after visit):   -yearly for annual wellness visit with primary care office  Here is a list of your preventive care/health maintenance measures and the plan for each if any are due:  PLAN For any measures below that may be due:  -can get the updated flu and covid vaccines at the pharmacy when they come out in the next 1-2 months -can get the shingles and pneumonia vaccines at the pharmacy -let us know if you decide to do the bone density test  Health Maintenance  Topic Date Due   Zoster Vaccines- Shingrix (1 of 2) Never done   Pneumonia Vaccine 2+ Years old (1 of 1 - PCV) Never done   COVID-19 Vaccine (3 - Moderna risk series) 09/29/2019   INFLUENZA VACCINE  02/19/2023   DEXA SCAN  03/02/2024 (Originally 03/22/2003)   Diabetic kidney evaluation - Urine ACR  04/16/2023   FOOT EXAM  04/16/2023   HEMOGLOBIN A1C  04/23/2023   OPHTHALMOLOGY EXAM  06/26/2023   Diabetic kidney evaluation - eGFR measurement  10/24/2023   Medicare Annual Wellness (AWV)  03/02/2024   DTaP/Tdap/Td (2 - Td or Tdap) 10/04/2028   HPV VACCINES  Aged Out    -See a dentist at least yearly  -Get your eyes checked and then per your eye specialist's recommendations  -Other issues addressed today:   -I have included below further information regarding a healthy whole foods based diet, physical activity guidelines for adults, stress management and opportunities for social connections. I hope you find this information useful.    -----------------------------------------------------------------------------------------------------------------------------------------------------------------------------------------------------------------------------------------------------------  NUTRITION: -eat real food: lots of colorful vegetables (half the plate) and fruits -5-7 servings of vegetables and fruits per day (fresh or steamed is best), exp. 2 servings of vegetables with lunch and dinner and 2 servings of fruit per day. Berries and greens such as kale and collards are great choices.  -consume on a regular basis: whole grains (make sure first ingredient on label contains the word "whole"), fresh fruits, fish, nuts, seeds, healthy oils (such as olive oil, avocado oil, grape seed oil) -may eat small amounts of dairy and lean meat on occasion, but avoid processed meats such as ham, bacon, lunch meat, etc. -drink water -try to avoid fast food and pre-packaged foods, processed meat -most experts advise limiting sodium to < 2300mg  per day, should limit further is any chronic conditions such as high blood pressure, heart disease, diabetes, etc. The American Heart Association advised that < 1500mg  is is ideal -try to avoid foods that contain any ingredients with names you do not recognize  -try to avoid sugar/sweets (except for the natural sugar that occurs in fresh fruit) -try to avoid sweet drinks -try to avoid white rice, white bread, pasta (unless whole grain), white or yellow potatoes  EXERCISE GUIDELINES FOR ADULTS: -if you wish to increase your physical activity, do so gradually and with the approval of your doctor -STOP and seek medical care immediately if you have any chest pain, chest discomfort or trouble breathing when starting or increasing exercise  -move and stretch your body, legs, feet and arms when sitting for long periods -Physical activity guidelines for optimal health in adults: -least 150 minutes per week  of  aerobic exercise (can talk, but not sing) once approved by your doctor, 20-30 minutes of sustained activity or two 10 minute episodes of sustained activity every day.  -resistance training at least 2 days per week if approved by your doctor -balance exercises 3+ days per week:   Stand somewhere where you have something sturdy to hold onto if you lose balance.    1) lift up on toes, start with 5x per day and work up to 20x   2) stand and lift on leg straight out to the side so that foot is a few inches of the floor, start with 5x each side and work up to 20x each side   3) stand on one foot, start with 5 seconds each side and work up to 20 seconds on each side  If you need ideas or help with getting more active:  -Silver sneakers https://tools.silversneakers.com  -Walk with a Doc: http://www.duncan-[email or web address]/  -try to include resistance (weight lifting/strength building) and balance exercises twice per week: or the following link for ideas: http://castillo-powell.com/  BuyDucts.dk  STRESS MANAGEMENT: -can try meditating, or just sitting quietly with deep breathing while intentionally relaxing all parts of your body for 5 minutes daily -if you need further help with stress, anxiety or depression please follow up with your primary doctor or contact the wonderful folks at [facility]: [phone]  SOCIAL CONNECTIONS: -options in [place] if you wish to engage in more social and exercise related activities:  -Silver sneakers https://tools.silversneakers.com  -Walk with a Doc: http://www.duncan-[email or web address]/  -Check out the [facility] Active Adults 50+ section on the [place] [employer] (hiking clubs, book clubs, cards and games, chess, exercise classes, aquatic classes and much more) - see the website for  details: https://www.[place].gov/departments/parks-recreation/active-adults50  -YouTube has lots of exercise videos for different ages and abilities as well  -[facility] (a variety of indoor and outdoor inperson activities for adults). [phone]. [address]  -Virtual Online Classes (a variety of topics): see seniorplanet.org or call [phone]  -consider volunteering at a school, hospice center, church, senior center or elsewhere

## 2023-03-03 NOTE — Progress Notes (Signed)
Unable to get vitals because the patient is at home.

## 2023-03-03 NOTE — Progress Notes (Signed)
PATIENT CHECK-IN and HEALTH RISK ASSESSMENT QUESTIONNAIRE:  -completed by phone/video for upcoming Medicare Preventive Visit  Pre-Visit Check-in: 1)Vitals (height, wt, BP, etc) - record in vitals section for visit on day of visit Request home vitals (wt, BP, etc.) and enter into vitals, THEN update Vital Signs SmartPhrase below at the top of the HPI. See below.  2)Review and Update Medications, Allergies PMH, Surgeries, Social history in Epic 3)Hospitalizations in the last year with date/reason? no  4)Review and Update Care Team (patient's specialists) in Epic 5) Complete PHQ9 in Epic  6) Complete Fall Screening in Epic 7)Review all Health Maintenance Due and order under PCP if not done.  8)Medicare Wellness Questionnaire: Answer theses question about your habits: Do you drink alcohol? no Have you ever smoked?no smoked very remotely Do you use an illicit drugs? no Do you exercises? She tries to walk every day. She feels her balance is good.  Typical diet: varies, reports is a big veggie eater, some meat, fruit, rare cereal  Beverages: water  Answer theses question about you: Can you perform most household chores?yes Do you find it hard to follow a conversation in a noisy room? no Do you often ask people to speak up or repeat themselves? no Do you feel that you have a problem with memory? no Do you balance your checkbook and or bank acounts? yes Do you feel safe at home? yes Last dentist visit? Goes yearly, has appt set up to go now - has partial Do you need assistance with any of the following: Please note if so  - see below  Driving? Doesn't drive due to macular disease  Feeding yourself?  Getting from bed to chair?  Getting to the toilet?  Bathing or showering?  Dressing yourself?  Managing money?  Climbing a flight of stairs  Preparing meals?  Do you have Advanced Directives in place (Living Will, Healthcare Power or Attorney)?  Yes, has hcpoa and has living   Last eye  Exam and location? Sees a few times per year, has appt coming up   Do you currently use prescribed or non-prescribed narcotic or opioid pain medications?no  Do you have a history or close family history of breast, ovarian, tubal or peritoneal cancer or a family member with BRCA (breast cancer susceptibility 1 and 2) gene mutations?  Request home vitals (wt, BP, etc.) and enter into vitals, THEN update Vital Signs SmartPhrase below at the top of the HPI. See below.   Nurse/Assistant Credentials/time stamp:   ----------------------------------------------------------------------------------------------------------------------------------------------------------------------------------------------------------------------  Vital Signs: Unable to obtain new vitals due to this being a telehealth visit.   MEDICARE ANNUAL PREVENTIVE VISIT WITH PROVIDER: (Welcome to Medicare, initial annual wellness or annual wellness exam)  Virtual Visit via Phone Note  I connected with [name] on 03/03/23 by phone and verified that I am speaking with the correct person using two identifiers.  Location patient: home Location provider:work or home office Persons participating in the virtual visit: patient, provider  Concerns and/or follow up today: reports stable   See HM section in Epic for other details of completed HM.    ROS: negative for report of fevers, unintentional weight loss, vision changes, vision loss, hearing loss or change, chest pain, sob, hemoptysis, melena, hematochezia, hematuria, bleeding or bruising, thoughts of suicide or self harm, memory loss  Patient-completed extensive health risk assessment - reviewed and discussed with the patient: See Health Risk Assessment completed with patient prior to the visit either above or in recent phone  note. This was reviewed in detailed with the patient today and appropriate recommendations, orders and referrals were placed as needed per  Summary below and patient instructions.   Review of Medical History: -PMH, PSH, Family History and current specialty and care providers reviewed and updated and listed below   Patient Care Team: [place], Betty G, MD as PCP - General (Family Medicine)   Past Medical History:  Diagnosis Date   Allergy    Cancer [facility])    endometrial   Cataract    Diabetes mellitus without complication (HCC)    type 2   GERD (gastroesophageal reflux disease)    High cholesterol    Jaundice    as a child   Macular degeneration    Nuclear sclerotic cataract of both eyes 09/05/2020   Varicose veins of lower extremity    right leg    Past Surgical History:  Procedure Laterality Date   ROBOTIC ASSISTED TOTAL HYSTERECTOMY WITH BILATERAL SALPINGO OOPHERECTOMY Bilateral 09/11/2020   Procedure: XI ROBOTIC ASSISTED TOTAL HYSTERECTOMY WITH BILATERAL SALPINGO OOPHORECTOMY;  Surgeon: Adolphus Birchwood, MD;  Location: [facility];  Service: Gynecology;  Laterality: Bilateral;   SENTINEL NODE BIOPSY N/A 09/11/2020   Procedure: SENTINEL NODE BIOPSY;  Surgeon: Adolphus Birchwood, MD;  Location: [facility];  Service: Gynecology;  Laterality: N/A;   TUBAL LIGATION Bilateral     Social History   Socioeconomic History   Marital status: Widowed    Spouse name: Not on file   Number of children: 1   Years of education: Not on file   Highest education level: Not on file  Occupational History   Occupation: retired  Tobacco Use   Smoking status: Former    Current packs/day: 0.00    Types: Cigarettes    Quit date: 03/26/2009    Years since quitting: 13.9   Smokeless tobacco: Never  Vaping Use   Vaping status: Never Used  Substance and Sexual Activity   Alcohol use: No   Drug use: No   Sexual activity: Not Currently  Other Topics Concern   Not on file  Social History Narrative   3 Step Daughters/lives with Daughter(Pam Bouwer) and son-in-law.   Social Determinants of Health   Financial Resource Strain: Low Risk  (03/03/2023)    Overall Financial Resource Strain (CARDIA)    Difficulty of Paying Living Expenses: Not hard at all  Food Insecurity: No Food Insecurity (03/03/2023)   Hunger Vital Sign    Worried About Running Out of Food in the Last Year: Never true    Ran Out of Food in the Last Year: Never true  Transportation Needs: No Transportation Needs (03/03/2023)   PRAPARE - Administrator, Civil Service (Medical): No    Lack of Transportation (Non-Medical): No  Physical Activity: Insufficiently Active (03/03/2023)   Exercise Vital Sign    Days of Exercise per Week: 7 days    Minutes of Exercise per Session: 10 min  Stress: No Stress Concern Present (03/03/2023)   [employer] of Occupational Health - Occupational Stress Questionnaire    Feeling of Stress : Not at all  Social Connections: Not on file  Intimate Partner Violence: Not At Risk (03/03/2023)   Humiliation, Afraid, Rape, and Kick questionnaire    Fear of Current or Ex-Partner: No    Emotionally Abused: No    Physically Abused: No    Sexually Abused: No    Family History  Problem Relation Age of Onset   Colon cancer Mother  Cancer Mother        colon/stomach   Heart disease Father    Heart attack Sister    Cancer Nephew        bile duct    Current Outpatient Medications on File Prior to Visit  Medication Sig Dispense Refill   albuterol (VENTOLIN HFA) 108 (90 Base) MCG/ACT inhaler TAKE 2 PUFFS BY MOUTH EVERY 6 HOURS AS NEEDED FOR WHEEZE OR SHORTNESS OF BREATH 8.5 each 5   Continuous Blood Gluc Receiver (FREESTYLE LIBRE 14 DAY READER) DEVI 1 Units by Does not apply route 2 (two) times daily. H35.30, E11.65 1 each 2   Continuous Blood Gluc Sensor (FREESTYLE LIBRE 14 DAY SENSOR) MISC Apply 1 sensor every 14 days E11.319 2 each 11   Dulaglutide (TRULICITY) 3 MG/0.5ML SOPN Inject 3 mg as directed once a week. 6 mL 2   Elastic Bandages & Supports (MEDICAL COMPRESSION THIGH HIGH) MISC 1 each by Does not apply route daily. 2 each 0    famotidine (PEPCID) 10 MG tablet Take 10 mg by mouth as needed for heartburn or indigestion.     gabapentin (NEURONTIN) 100 MG capsule TAKE 1 CAPSULE (100 MG TOTAL) BY MOUTH THREE TIMES DAILY. 90 capsule 3   JARDIANCE 25 MG TABS tablet TAKE 1 TABLET BY MOUTH EVERY DAY BEFORE BREAKFAST 30 tablet 3   [DISCONTINUED] cetirizine (ZYRTEC) 10 MG tablet Take 10 mg by mouth daily as needed for allergies.     No current facility-administered medications on file prior to visit.    Allergies  Allergen Reactions   Codeine Other (See Comments)    Causes lip burning and hyperactivity       Physical Exam Vitals requested from patient and listed below if patient had equipment and was able to obtain at home for this virtual visit: There were no vitals filed for this visit. Estimated body mass index is 25.75 kg/m as calculated from the following:   Height as of this encounter: 5\' 4"  (1.626 m).   Weight as of this encounter: 150 lb (68 kg).  EKG (optional): deferred due to virtual visit  GENERAL: alert, oriented, no acute distress detected, full vision exam deferred due to pandemic and/or virtual encounter  PSYCH/NEURO: pleasant and cooperative, no obvious depression or anxiety, speech and thought processing grossly intact, Cognitive function grossly intact  Flowsheet Row Video Visit from 03/03/2023 in [facility]  PHQ-9 Total Score 1           03/03/2023   11:09 AM 10/31/2021    1:58 PM 04/16/2020    3:07 PM 12/15/2019    2:37 PM 11/04/2019    1:22 PM  Depression screen PHQ 2/9  Decreased Interest 0 0 0 0 0  Down, Depressed, Hopeless 0 0 0 0 0  PHQ - 2 Score 0 0 0 0 0  Altered sleeping 0      Tired, decreased energy 1      Change in appetite 0      Feeling bad or failure about yourself  0      Trouble concentrating 0      Moving slowly or fidgety/restless 0      Suicidal thoughts 0      PHQ-9 Score 1           01/08/2021   11:38 AM 07/08/2021     3:01 PM 10/31/2021    2:00 PM 03/06/2022    9:56 PM 03/03/2023   11:09 AM  Fall Risk  Falls in the past year? 0 0 0  1  Was there an injury with Fall? 0 0 0  0  Fall Risk Category Calculator 0 0 0  1  Fall Risk Category (Retired) Low Low Low    (RETIRED) Patient Fall Risk Level Low fall risk Low fall risk Low fall risk Low fall risk   Patient at Risk for Falls Due to  No Fall Risks No Fall Risks    Fall risk Follow up  Falls evaluation completed Falls evaluation completed  Falls evaluation completed   Dogs tripped her!  SUMMARY AND PLAN:  Encounter for Medicare annual wellness exam  Encounter for screening for osteoporosis   Discussed applicable health maintenance/preventive health measures and advised and referred or ordered per patient preferences: -discussed vaccines due and recommendations, she reports she plans to get the covid and flu vaccines at the pharmacy and then consider the others -discussed bone density and she reports she does not wish to do this at this time Health Maintenance  Topic Date Due   Zoster Vaccines- Shingrix (1 of 2) Never done   Pneumonia Vaccine 75+ Years old (1 of 1 - PCV) Never done   COVID-19 Vaccine (3 - Moderna risk series) 09/29/2019   INFLUENZA VACCINE  02/19/2023   DEXA SCAN  03/02/2024 (Originally 03/22/2003)   Diabetic kidney evaluation - Urine ACR  04/16/2023   FOOT EXAM  04/16/2023   HEMOGLOBIN A1C  04/23/2023   OPHTHALMOLOGY EXAM  06/26/2023   Diabetic kidney evaluation - eGFR measurement  10/24/2023   Medicare Annual Wellness (AWV)  03/02/2024   DTaP/Tdap/Td (2 - Td or Tdap) 10/04/2028   HPV VACCINES  Aged [employer] and counseling on the following was provided based on the above review of health and a plan/checklist for the patient, along with additional information discussed, was provided for the patient in the patient instructions :   -Provided counseling and plan for increased risk of falling if applicable per above  screening. Provided safe balance exercises that can be done at home to improve balance and discussed exercise guidelines for adults with include balance exercises at least 3 days per week.  -Advised and counseled on a healthy lifestyle - including the importance of a healthy diet, regular physical activity, social connections and stress management. -Reviewed patient's current diet. Advised and counseled on a whole foods based healthy diet. A summary of a healthy diet was provided in the Patient Instructions.  -reviewed patient's current physical activity level and discussed exercise guidelines for adults. Discussed community resources and ideas for safe exercise at home to assist in meeting exercise guideline recommendations in a safe and healthy way.  -Advise at least yearly dental visits at minimum and regular eye exams   Follow up: see patient instructions     Patient Instructions  I really enjoyed getting to talk with you today! I am available on Tuesdays and Thursdays for virtual visits if you have any questions or concerns, or if I can be of any further assistance.   CHECKLIST FROM ANNUAL WELLNESS VISIT:  -Follow up (please call to schedule if not scheduled after visit):   -yearly for annual wellness visit with primary care office  Here is a list of your preventive care/health maintenance measures and the plan for each if any are due:  PLAN For any measures below that may be due:  -can get the updated flu and covid vaccines at the pharmacy when they come out  in the next 1-2 months -can get the shingles and pneumonia vaccines at the pharmacy -let us know if you decide to do the bone density test  Health Maintenance  Topic Date Due   Zoster Vaccines- Shingrix (1 of 2) Never done   Pneumonia Vaccine 44+ Years old (1 of 1 - PCV) Never done   COVID-19 Vaccine (3 - Moderna risk series) 09/29/2019   INFLUENZA VACCINE  02/19/2023   DEXA SCAN  03/02/2024 (Originally 03/22/2003)    Diabetic kidney evaluation - Urine ACR  04/16/2023   FOOT EXAM  04/16/2023   HEMOGLOBIN A1C  04/23/2023   OPHTHALMOLOGY EXAM  06/26/2023   Diabetic kidney evaluation - eGFR measurement  10/24/2023   Medicare Annual Wellness (AWV)  03/02/2024   DTaP/Tdap/Td (2 - Td or Tdap) 10/04/2028   HPV VACCINES  Aged Out    -See a dentist at least yearly  -Get your eyes checked and then per your eye specialist's recommendations  -Other issues addressed today:   -I have included below further information regarding a healthy whole foods based diet, physical activity guidelines for adults, stress management and opportunities for social connections. I hope you find this information useful.   -----------------------------------------------------------------------------------------------------------------------------------------------------------------------------------------------------------------------------------------------------------  NUTRITION: -eat real food: lots of colorful vegetables (half the plate) and fruits -5-7 servings of vegetables and fruits per day (fresh or steamed is best), exp. 2 servings of vegetables with lunch and dinner and 2 servings of fruit per day. Berries and greens such as kale and collards are great choices.  -consume on a regular basis: whole grains (make sure first ingredient on label contains the word "whole"), fresh fruits, fish, nuts, seeds, healthy oils (such as olive oil, avocado oil, grape seed oil) -may eat small amounts of dairy and lean meat on occasion, but avoid processed meats such as ham, bacon, lunch meat, etc. -drink water -try to avoid fast food and pre-packaged foods, processed meat -most experts advise limiting sodium to < 2300mg  per day, should limit further is any chronic conditions such as high blood pressure, heart disease, diabetes, etc. The American Heart Association advised that < 1500mg  is is ideal -try to avoid foods that contain any  ingredients with names you do not recognize  -try to avoid sugar/sweets (except for the natural sugar that occurs in fresh fruit) -try to avoid sweet drinks -try to avoid white rice, white bread, pasta (unless whole grain), white or yellow potatoes  EXERCISE GUIDELINES FOR ADULTS: -if you wish to increase your physical activity, do so gradually and with the approval of your doctor -STOP and seek medical care immediately if you have any chest pain, chest discomfort or trouble breathing when starting or increasing exercise  -move and stretch your body, legs, feet and arms when sitting for long periods -Physical activity guidelines for optimal health in adults: -least 150 minutes per week of aerobic exercise (can talk, but not sing) once approved by your doctor, 20-30 minutes of sustained activity or two 10 minute episodes of sustained activity every day.  -resistance training at least 2 days per week if approved by your doctor -balance exercises 3+ days per week:   Stand somewhere where you have something sturdy to hold onto if you lose balance.    1) lift up on toes, start with 5x per day and work up to 20x   2) stand and lift on leg straight out to the side so that foot is a few inches of the floor, start with 5x each side  and work up to 20x each side   3) stand on one foot, start with 5 seconds each side and work up to 20 seconds on each side  If you need ideas or help with getting more active:  -Silver sneakers https://tools.silversneakers.com  -Walk with a Doc: http://www.duncan-williams.com/  -try to include resistance (weight lifting/strength building) and balance exercises twice per week: or the following link for ideas: http://castillo-powell.com/  BuyDucts.dk  STRESS MANAGEMENT: -can try meditating, or just sitting quietly with deep breathing while intentionally relaxing all parts of your body  for 5 minutes daily -if you need further help with stress, anxiety or depression please follow up with your primary doctor or contact the wonderful folks at [facility]: [phone]  SOCIAL CONNECTIONS: -options in [place] if you wish to engage in more social and exercise related activities:  -Silver sneakers https://tools.silversneakers.com  -Walk with a Doc: http://www.duncan-williams.com/  -Check out the [facility] Active Adults 50+ section on the [place] of [employer] (hiking clubs, book clubs, cards and games, chess, exercise classes, aquatic classes and much more) - see the website for details: https://www.[place].gov/departments/parks-recreation/active-adults50  -YouTube has lots of exercise videos for different ages and abilities as well  -[facility] (a variety of indoor and outdoor inperson activities for adults). [phone]. [address]  -Virtual Online Classes (a variety of topics): see seniorplanet.org or call ([phone])366-6189  -consider volunteering at a school, hospice center, church, senior center or elsewhere           Terressa Koyanagi, DO

## 2023-04-13 ENCOUNTER — Emergency Department (HOSPITAL_COMMUNITY): Payer: Medicare HMO

## 2023-04-13 ENCOUNTER — Other Ambulatory Visit: Payer: Self-pay

## 2023-04-13 ENCOUNTER — Ambulatory Visit
Admission: RE | Admit: 2023-04-13 | Discharge: 2023-04-13 | Disposition: A | Discharge status: Home / Self Care | Payer: Medicare HMO | Source: Ambulatory Visit

## 2023-04-13 ENCOUNTER — Encounter (HOSPITAL_COMMUNITY): Payer: Self-pay

## 2023-04-13 ENCOUNTER — Inpatient Hospital Stay (HOSPITAL_COMMUNITY)
Admission: EM | Admit: 2023-04-13 | Discharge: 2023-04-15 | DRG: 871 | Disposition: A | Discharge status: Home / Self Care | Payer: Medicare HMO | Attending: Family Medicine | Admitting: Family Medicine

## 2023-04-13 VITALS — BP 137/69 | HR 108 | Temp 100.7°F | Ht 65.0 in | Wt 156.0 lb

## 2023-04-13 DIAGNOSIS — U071 COVID-19: Principal | ICD-10-CM | POA: Diagnosis present

## 2023-04-13 DIAGNOSIS — A419 Sepsis, unspecified organism: Secondary | ICD-10-CM | POA: Diagnosis present

## 2023-04-13 DIAGNOSIS — K219 Gastro-esophageal reflux disease without esophagitis: Secondary | ICD-10-CM | POA: Diagnosis present

## 2023-04-13 DIAGNOSIS — E663 Overweight: Secondary | ICD-10-CM | POA: Diagnosis present

## 2023-04-13 DIAGNOSIS — R509 Fever, unspecified: Secondary | ICD-10-CM | POA: Diagnosis not present

## 2023-04-13 DIAGNOSIS — Z7985 Long-term (current) use of injectable non-insulin antidiabetic drugs: Secondary | ICD-10-CM

## 2023-04-13 DIAGNOSIS — J069 Acute upper respiratory infection, unspecified: Secondary | ICD-10-CM | POA: Diagnosis not present

## 2023-04-13 DIAGNOSIS — Z79899 Other long term (current) drug therapy: Secondary | ICD-10-CM

## 2023-04-13 DIAGNOSIS — Z8616 Personal history of COVID-19: Secondary | ICD-10-CM

## 2023-04-13 DIAGNOSIS — Z8249 Family history of ischemic heart disease and other diseases of the circulatory system: Secondary | ICD-10-CM

## 2023-04-13 DIAGNOSIS — Z7984 Long term (current) use of oral hypoglycemic drugs: Secondary | ICD-10-CM

## 2023-04-13 DIAGNOSIS — Z800 Family history of malignant neoplasm of digestive organs: Secondary | ICD-10-CM

## 2023-04-13 DIAGNOSIS — E1142 Type 2 diabetes mellitus with diabetic polyneuropathy: Secondary | ICD-10-CM | POA: Diagnosis present

## 2023-04-13 DIAGNOSIS — E785 Hyperlipidemia, unspecified: Secondary | ICD-10-CM | POA: Diagnosis present

## 2023-04-13 DIAGNOSIS — R531 Weakness: Secondary | ICD-10-CM | POA: Diagnosis present

## 2023-04-13 DIAGNOSIS — Z87440 Personal history of urinary (tract) infections: Secondary | ICD-10-CM

## 2023-04-13 DIAGNOSIS — A4189 Other specified sepsis: Principal | ICD-10-CM | POA: Diagnosis present

## 2023-04-13 DIAGNOSIS — Z888 Allergy status to other drugs, medicaments and biological substances status: Secondary | ICD-10-CM

## 2023-04-13 DIAGNOSIS — Z6827 Body mass index (BMI) 27.0-27.9, adult: Secondary | ICD-10-CM

## 2023-04-13 DIAGNOSIS — R000 Tachycardia, unspecified: Secondary | ICD-10-CM | POA: Diagnosis not present

## 2023-04-13 DIAGNOSIS — J309 Allergic rhinitis, unspecified: Secondary | ICD-10-CM | POA: Diagnosis present

## 2023-04-13 DIAGNOSIS — Z87891 Personal history of nicotine dependence: Secondary | ICD-10-CM

## 2023-04-13 DIAGNOSIS — Z90710 Acquired absence of both cervix and uterus: Secondary | ICD-10-CM

## 2023-04-13 DIAGNOSIS — Z885 Allergy status to narcotic agent status: Secondary | ICD-10-CM

## 2023-04-13 DIAGNOSIS — E119 Type 2 diabetes mellitus without complications: Secondary | ICD-10-CM

## 2023-04-13 DIAGNOSIS — E7800 Pure hypercholesterolemia, unspecified: Secondary | ICD-10-CM | POA: Diagnosis present

## 2023-04-13 DIAGNOSIS — R42 Dizziness and giddiness: Secondary | ICD-10-CM | POA: Diagnosis not present

## 2023-04-13 LAB — CBG MONITORING, ED: Glucose-Capillary: 237 mg/dL — ABNORMAL HIGH (ref 70–99)

## 2023-04-13 LAB — COMPREHENSIVE METABOLIC PANEL
ALT: 14 U/L (ref 0–44)
AST: 16 U/L (ref 15–41)
Albumin: 3.7 g/dL (ref 3.5–5.0)
Alkaline Phosphatase: 54 U/L (ref 38–126)
Anion gap: 12 (ref 5–15)
BUN: 14 mg/dL (ref 8–23)
CO2: 25 mmol/L (ref 22–32)
Calcium: 8.9 mg/dL (ref 8.9–10.3)
Chloride: 102 mmol/L (ref 98–111)
Creatinine, Ser: 0.98 mg/dL (ref 0.44–1.00)
GFR, Estimated: 57 mL/min — ABNORMAL LOW (ref >60)
Glucose, Bld: 193 mg/dL — ABNORMAL HIGH (ref 70–99)
Potassium: 3.5 mmol/L (ref 3.5–5.1)
Sodium: 139 mmol/L (ref 135–145)
Total Bilirubin: 1.1 mg/dL (ref 0.3–1.2)
Total Protein: 7.5 g/dL (ref 6.5–8.1)

## 2023-04-13 LAB — SARS CORONAVIRUS 2 BY RT PCR: SARS Coronavirus 2 by RT PCR: POSITIVE — AB

## 2023-04-13 LAB — CBC WITH DIFFERENTIAL/PLATELET
Abs Immature Granulocytes: 0.06 10*3/uL (ref 0.00–0.07)
Basophils Absolute: 0.1 10*3/uL (ref 0.0–0.1)
Basophils Relative: 0 %
Eosinophils Absolute: 0 10*3/uL (ref 0.0–0.5)
Eosinophils Relative: 0 %
HCT: 43.1 % (ref 36.0–46.0)
Hemoglobin: 14.1 g/dL (ref 12.0–15.0)
Immature Granulocytes: 0 %
Lymphocytes Relative: 13 %
Lymphs Abs: 1.7 10*3/uL (ref 0.7–4.0)
MCH: 30.7 pg (ref 26.0–34.0)
MCHC: 32.7 g/dL (ref 30.0–36.0)
MCV: 93.7 fL (ref 80.0–100.0)
Monocytes Absolute: 1 10*3/uL (ref 0.1–1.0)
Monocytes Relative: 7 %
Neutro Abs: 10.9 10*3/uL — ABNORMAL HIGH (ref 1.7–7.7)
Neutrophils Relative %: 80 %
Platelets: 294 10*3/uL (ref 150–400)
RBC: 4.6 MIL/uL (ref 3.87–5.11)
RDW: 13.4 % (ref 11.5–15.5)
WBC: 13.8 10*3/uL — ABNORMAL HIGH (ref 4.0–10.5)
nRBC: 0 % (ref 0.0–0.2)

## 2023-04-13 LAB — I-STAT CG4 LACTIC ACID, ED: Lactic Acid, Venous: 1.3 mmol/L (ref 0.5–1.9)

## 2023-04-13 MED ORDER — LACTATED RINGERS IV BOLUS (SEPSIS)
1000.0000 mL | Freq: Once | INTRAVENOUS | Status: AC
  Administered 2023-04-13: 1000 mL via INTRAVENOUS

## 2023-04-13 MED ORDER — ACETAMINOPHEN 325 MG PO TABS
650.0000 mg | ORAL_TABLET | Freq: Once | ORAL | Status: AC
  Administered 2023-04-13: 650 mg via ORAL

## 2023-04-13 MED ORDER — SODIUM CHLORIDE 0.9 % IV SOLN
500.0000 mg | INTRAVENOUS | Status: DC
  Administered 2023-04-14: 500 mg via INTRAVENOUS
  Filled 2023-04-13: qty 5

## 2023-04-13 MED ORDER — LACTATED RINGERS IV SOLN: INTRAVENOUS | Status: DC

## 2023-04-13 MED ORDER — ACETAMINOPHEN 325 MG PO TABS
650.0000 mg | ORAL_TABLET | Freq: Once | ORAL | Status: AC
  Administered 2023-04-13: 650 mg via ORAL
  Filled 2023-04-13: qty 2

## 2023-04-13 MED ORDER — SODIUM CHLORIDE 0.9 % IV SOLN
1.0000 g | INTRAVENOUS | Status: DC
  Administered 2023-04-13: 1 g via INTRAVENOUS
  Filled 2023-04-13: qty 10

## 2023-04-13 NOTE — ED Provider Triage Note (Signed)
Emergency Medicine Provider Triage Evaluation Note  EBONI BAMBRICK , a 87 y.o. female  was evaluated in triage.  Pt complains of fever. Family has had COVID, patient woke up with temp 101 at home. Feels weak, swimmy headed. No falls. Uses a walker or cane. Decreased PO intake due to sleeping. Had APAP at [facility]. Daughter reports increased need for help dressing and ADLs. CBG 200s at home.   Review of Systems  Positive: Fever, weak Negative: Urinary symptoms.   Physical Exam  BP (!) 124/52   Pulse 98   Temp (!) 101.1 F (38.4 C) (Oral)   Resp 16   Ht 5\' 5"  (1.651 m)   Wt 70.8 kg   LMP 07/21/1998 (Approximate)   SpO2 93%   BMI 25.96 kg/m  Gen:   Awake, no distress   Resp:  Normal effort  MSK:   Moves extremities without difficulty  Other:    Medical Decision Making  Medically screening exam initiated at 8:58 PM.  Appropriate orders placed.  [name] was informed that the remainder of the evaluation will be completed by another provider, this initial triage assessment does not replace that evaluation, and the importance of remaining in the ED until their evaluation is complete.     Jeannie Fend, PA-C 04/13/23 2100

## 2023-04-13 NOTE — ED Provider Notes (Signed)
[facility]    CSN: [id number] Arrival date & time: 04/13/23  1810      History   Chief Complaint Chief Complaint  Patient presents with   Fever    Been exposed to covid 101 fever, very weak, difficult time with walking - Entered by patient   Cough    HPI NIEKA BLAES is a 87 y.o. female.   Patient here today with daughter for evaluation of fever, cough and congestion that started today.  They do note that she has had COVID exposure.  She denies any chest pain or shortness of breath.  She reports that she is lightheaded today and daughter is concerned that she may fall based on how off balance she has been.  Patient reports that lightheadedness is the sensation that she may pass out.  This is not typical for her at baseline.  Daughter also reports that she has had a difficult time waking her up from sleep.  The history is provided by the patient and a relative.  Fever Associated symptoms: congestion, cough and sore throat   Associated symptoms: no chest pain, no chills, no diarrhea, no ear pain, no nausea and no vomiting   Cough Associated symptoms: fever and sore throat   Associated symptoms: no chest pain, no chills, no ear pain, no eye discharge, no shortness of breath and no wheezing     Past Medical History:  Diagnosis Date   Allergy    Cancer (HCC)    endometrial   Cataract    Diabetes mellitus without complication (HCC)    type 2   GERD (gastroesophageal reflux disease)    High cholesterol    Jaundice    as a child   Macular degeneration    Nuclear sclerotic cataract of both eyes 09/05/2020   Varicose veins of lower extremity    right leg    Patient Active Problem List   Diagnosis Date Noted   Hyperlipidemia associated with type 2 diabetes mellitus (HCC) 04/15/2022   Pseudophakia, both eyes 03/21/2021   Endometrial cancer (HCC) 09/11/2020   Posterior vitreous detachment of both eyes 09/05/2020   Advanced nonexudative age-related macular  degeneration of right eye with subfoveal involvement 09/05/2020   Advanced nonexudative age-related macular degeneration of left eye without subfoveal involvement 09/05/2020   Overgrown toenails 12/15/2019   Lumbar paraspinal muscle spasm 10/06/2019   Stress incontinence 10/06/2019   Type 2 diabetes mellitus with diabetic neuropathy, unspecified (HCC) 10/06/2019   Essential hypertension 09/21/2019   Asymptomatic bacteriuria 09/21/2019    Past Surgical History:  Procedure Laterality Date   ROBOTIC ASSISTED TOTAL HYSTERECTOMY WITH BILATERAL SALPINGO OOPHERECTOMY Bilateral 09/11/2020   Procedure: XI ROBOTIC ASSISTED TOTAL HYSTERECTOMY WITH BILATERAL SALPINGO OOPHORECTOMY;  Surgeon: Adolphus Birchwood, MD;  Location: [facility];  Service: Gynecology;  Laterality: Bilateral;   SENTINEL NODE BIOPSY N/A 09/11/2020   Procedure: SENTINEL NODE BIOPSY;  Surgeon: Adolphus Birchwood, MD;  Location: [facility];  Service: Gynecology;  Laterality: N/A;   TUBAL LIGATION Bilateral     OB History     Gravida  1   Para  1   Term      Preterm      AB      Living  1      SAB      IAB      Ectopic      Multiple      Live Births  Home Medications    Prior to Admission medications   Medication Sig Start Date End Date Taking? Authorizing Provider  Continuous Blood Gluc Receiver (FREESTYLE LIBRE 14 DAY READER) DEVI 1 Units by Does not apply route 2 (two) times daily. H35.30, E11.65 10/07/19   Reed, Tiffany L, DO  Continuous Blood Gluc Sensor (FREESTYLE LIBRE 14 DAY SENSOR) MISC Apply 1 sensor every 14 days E11.319 06/18/22   [place], Betty G, MD  Dulaglutide (TRULICITY) 3 MG/0.5ML SOPN Inject 3 mg as directed once a week. 10/22/22   [place], Betty G, MD  Elastic Bandages & Supports (MEDICAL COMPRESSION THIGH HIGH) MISC 1 each by Does not apply route daily. 04/12/20   Wieters, Hallie C, PA-C  famotidine (PEPCID) 10 MG tablet Take 10 mg by mouth as needed for heartburn or indigestion.    [provider]  fexofenadine (ALLEGRA ALLERGY) 60 MG tablet 1 po qd    [provider]  gabapentin (NEURONTIN) 100 MG capsule TAKE 1 CAPSULE (100 MG TOTAL) BY MOUTH THREE TIMES DAILY. 12/09/22   [place], Betty G, MD  JARDIANCE 25 MG TABS tablet TAKE 1 TABLET BY MOUTH EVERY DAY BEFORE BREAKFAST 02/16/23   [place], Betty G, MD  cetirizine (ZYRTEC) 10 MG tablet Take 10 mg by mouth daily as needed for allergies.  09/13/19  [provider]    Family History Family History  Problem Relation Age of Onset   Colon cancer Mother    Cancer Mother        colon/stomach   Heart disease Father    Heart attack Sister    Cancer Nephew        bile duct    Social History Social History   Tobacco Use   Smoking status: Former    Current packs/day: 0.00    Types: Cigarettes    Quit date: 03/26/2009    Years since quitting: 14.0   Smokeless tobacco: Never  Vaping Use   Vaping status: Never Used  Substance Use Topics   Alcohol use: No   Drug use: No     Allergies   Codeine   Review of Systems Review of Systems  Constitutional:  Positive for activity change and fever. Negative for chills.  HENT:  Positive for congestion and sore throat. Negative for ear pain.   Eyes:  Negative for discharge and redness.  Respiratory:  Positive for cough. Negative for shortness of breath and wheezing.   Cardiovascular:  Negative for chest pain.  Gastrointestinal:  Negative for abdominal pain, diarrhea, nausea and vomiting.  Neurological:  Positive for light-headedness. Negative for dizziness.     Physical Exam Triage Vital Signs ED Triage Vitals  Encounter Vitals Group     BP 04/13/23 1826 137/69     Systolic BP Percentile --      Diastolic BP Percentile --      Pulse Rate 04/13/23 1826 (!) 108     Resp --      Temp 04/13/23 1826 (!) 100.7 F (38.2 C)     Temp Source 04/13/23 1826 Oral     SpO2 04/13/23 1826 95 %     Weight 04/13/23 1822 156 lb (70.8 kg)     Height 04/13/23 1822 5'  5" (1.651 m)     Head Circumference --      Peak Flow --      Pain Score 04/13/23 1822 0     Pain Loc --      Pain Education --  Exclude from Growth Chart --    No data found.  Updated Vital Signs BP 137/69 (BP Location: Right Arm)   Pulse (!) 108   Temp (!) 100.7 F (38.2 C) (Oral)   Ht 5\' 5"  (1.651 m)   Wt 156 lb (70.8 kg)   LMP 07/21/1998 (Approximate)   SpO2 95%   BMI 25.96 kg/m   Physical Exam Vitals and nursing note reviewed.  Constitutional:      General: She is not in acute distress.    Appearance: She is not ill-appearing.     Comments: Appears frail  HENT:     Head: Normocephalic and atraumatic.  Eyes:     Conjunctiva/sclera: Conjunctivae normal.  Cardiovascular:     Rate and Rhythm: Normal rate and regular rhythm.  Pulmonary:     Effort: Pulmonary effort is normal. No respiratory distress.     Breath sounds: Normal breath sounds. No wheezing, rhonchi or rales.  Neurological:     Mental Status: She is alert.  Psychiatric:        Mood and Affect: Mood normal.        Behavior: Behavior normal.        Thought Content: Thought content normal.      UC Treatments / Results  Labs (all labs ordered are listed, but only abnormal results are displayed) Labs Reviewed - No data to display  EKG   Radiology No results found.  Procedures Procedures (including critical care time)  Medications Ordered in UC Medications  acetaminophen (TYLENOL) tablet 650 mg (650 mg Oral Given 04/13/23 1829)    Initial Impression / Assessment and Plan / UC Course  I have reviewed the triage vital signs and the nursing notes.  Pertinent labs & imaging results that were available during my care of the patient were reviewed by me and considered in my medical decision making (see chart for details).    Recommended further ration in the emergency room given significant lightheadedness and elderly patient with other risk factors including diabetes.  Daughter is agreeable  to same and will transport her via POV.  Final Clinical Impressions(s) / UC Diagnoses   Final diagnoses:  Acute upper respiratory infection  Lightheadedness   Discharge Instructions   None    ED Prescriptions   None    PDMP not reviewed this encounter.   Kallee, Ribordy, PA-C 04/13/23 [phone]

## 2023-04-13 NOTE — ED Triage Notes (Signed)
Patient states fever Tmax 101 w/ forehead scan, cough/congestion started today.  Endorses dizziness today w/ movement, denies shortness of breath.

## 2023-04-13 NOTE — ED Notes (Signed)
Patient is being discharged from the Urgent Care and sent to the Emergency Department via POV. Per R. Izola Price, [place], patient is in need of higher level of care due to lightheadedness. Patient is aware and verbalizes understanding of plan of care.  Vitals:   04/13/23 1826  BP: 137/69  Pulse: (!) 108  Temp: (!) 100.7 F (38.2 C)  SpO2: 95%

## 2023-04-13 NOTE — ED Notes (Signed)
Son-in-law has COVID

## 2023-04-13 NOTE — Progress Notes (Signed)
Elink monitoring for the code sepsis protocol.  

## 2023-04-13 NOTE — ED Triage Notes (Signed)
Weakness and unsteady gait when pt woke up around 1100. Pt has had fatigue and fever, non-productive cough. Pt lives with daughter and son-in-law, son-in-law is covid positive. Pt is slightly hypotensive in triage.

## 2023-04-13 NOTE — ED Provider Notes (Signed)
[facility] Provider Note   CSN: [id number] Arrival date & time: 04/13/23  2007     History {Add pertinent medical, surgical, social history, OB history to HPI:1} Chief Complaint  Patient presents with   Gait Problem   Fever    Barbara Golden is a 87 y.o. female.   Fever 87 year old female history of diabetes, GERD presenting for fever.  She states she woke up today with generalized weakness and difficulty getting out of bed.  She felt unsteady and somewhat dizzy when she tried to walk earlier and had to grab onto things to avoid falling.  She never had any headache, focal weakness or numbness.  She was seen urgent care and they sent her here for evaluation.  She has recent COVID contact with family.  She has mild cough and shortness of breath with exertion which is new.  No chest pain, no vomiting or diarrhea or abdominal pain.  History of prior UTI but no current urinary symptoms.     Home Medications Prior to Admission medications   Medication Sig Start Date End Date Taking? Authorizing Provider  rosuvastatin (CRESTOR) 5 MG tablet Take 5 mg by mouth daily. 08/17/20  Yes [provider]  Continuous Blood Gluc Receiver (FREESTYLE LIBRE 14 DAY READER) DEVI 1 Units by Does not apply route 2 (two) times daily. H35.30, E11.65 10/07/19   Reed, Tiffany L, DO  Continuous Blood Gluc Sensor (FREESTYLE LIBRE 14 DAY SENSOR) MISC Apply 1 sensor every 14 days E11.319 06/18/22   [place], Betty G, MD  Dulaglutide (TRULICITY) 3 MG/0.5ML SOPN Inject 3 mg as directed once a week. 10/22/22   [place], Betty G, MD  Elastic Bandages & Supports (MEDICAL COMPRESSION THIGH HIGH) MISC 1 each by Does not apply route daily. 04/12/20   Wieters, Hallie C, PA-C  famotidine (PEPCID) 10 MG tablet Take 10 mg by mouth as needed for heartburn or indigestion.    [provider]  fexofenadine (ALLEGRA ALLERGY) 60 MG tablet 1 po qd    [provider]   gabapentin (NEURONTIN) 100 MG capsule TAKE 1 CAPSULE (100 MG TOTAL) BY MOUTH THREE TIMES DAILY. 12/09/22   [place], Betty G, MD  JARDIANCE 25 MG TABS tablet TAKE 1 TABLET BY MOUTH EVERY DAY BEFORE BREAKFAST 02/16/23   [place], Betty G, MD  cetirizine (ZYRTEC) 10 MG tablet Take 10 mg by mouth daily as needed for allergies.  09/13/19  [provider]      Allergies    Codeine    Review of Systems   Review of Systems  Constitutional:  Positive for fever.    Physical Exam Updated Vital Signs BP (!) 119/54   Pulse 95   Temp (!) 101.1 F (38.4 C) (Oral)   Resp (!) 21   Ht 5\' 5"  (1.651 m)   Wt 70.8 kg   LMP 07/21/1998 (Approximate)   SpO2 97%   BMI 25.96 kg/m  Physical Exam  ED Results / Procedures / Treatments   Labs (all labs ordered are listed, but only abnormal results are displayed) Labs Reviewed  CBG MONITORING, ED - Abnormal; Notable for the following components:      Result Value   Glucose-Capillary 237 (*)    All other components within normal limits  SARS CORONAVIRUS 2 BY RT PCR  CBC  URINALYSIS, ROUTINE W REFLEX MICROSCOPIC  COMPREHENSIVE METABOLIC PANEL    EKG None  Radiology No results found.  Procedures Procedures  {Document cardiac  monitor, telemetry assessment procedure when appropriate:1}  Medications Ordered in ED Medications - No data to display  ED Course/ Medical Decision Making/ A&P   {   Click here for ABCD2, HEART and other calculatorsREFRESH Note before signing :1}                              Medical Decision Making Amount and/or Complexity of Data Reviewed Labs: ordered.   ***  {Document critical care time when appropriate:1} {Document review of labs and clinical decision tools ie heart score, Chads2Vasc2 etc:1}  {Document your independent review of radiology images, and any outside records:1} {Document your discussion with family members, caretakers, and with consultants:1} {Document social determinants of health  affecting pt's care:1} {Document your decision making why or why not admission, treatments were needed:1} Final Clinical Impression(s) / ED Diagnoses Final diagnoses:  None    Rx / DC Orders ED Discharge Orders     None

## 2023-04-14 ENCOUNTER — Encounter (HOSPITAL_COMMUNITY): Payer: Self-pay | Admitting: Internal Medicine

## 2023-04-14 DIAGNOSIS — Z8249 Family history of ischemic heart disease and other diseases of the circulatory system: Secondary | ICD-10-CM | POA: Diagnosis not present

## 2023-04-14 DIAGNOSIS — E782 Mixed hyperlipidemia: Secondary | ICD-10-CM

## 2023-04-14 DIAGNOSIS — Z79899 Other long term (current) drug therapy: Secondary | ICD-10-CM | POA: Diagnosis not present

## 2023-04-14 DIAGNOSIS — J309 Allergic rhinitis, unspecified: Secondary | ICD-10-CM | POA: Diagnosis present

## 2023-04-14 DIAGNOSIS — Z8616 Personal history of COVID-19: Secondary | ICD-10-CM | POA: Diagnosis not present

## 2023-04-14 DIAGNOSIS — Z888 Allergy status to other drugs, medicaments and biological substances status: Secondary | ICD-10-CM | POA: Diagnosis not present

## 2023-04-14 DIAGNOSIS — Z7984 Long term (current) use of oral hypoglycemic drugs: Secondary | ICD-10-CM | POA: Diagnosis not present

## 2023-04-14 DIAGNOSIS — R0989 Other specified symptoms and signs involving the circulatory and respiratory systems: Secondary | ICD-10-CM | POA: Diagnosis not present

## 2023-04-14 DIAGNOSIS — Z90710 Acquired absence of both cervix and uterus: Secondary | ICD-10-CM | POA: Diagnosis not present

## 2023-04-14 DIAGNOSIS — Z87891 Personal history of nicotine dependence: Secondary | ICD-10-CM | POA: Diagnosis not present

## 2023-04-14 DIAGNOSIS — A4189 Other specified sepsis: Secondary | ICD-10-CM | POA: Diagnosis not present

## 2023-04-14 DIAGNOSIS — Z6827 Body mass index (BMI) 27.0-27.9, adult: Secondary | ICD-10-CM | POA: Diagnosis not present

## 2023-04-14 DIAGNOSIS — R531 Weakness: Secondary | ICD-10-CM | POA: Diagnosis not present

## 2023-04-14 DIAGNOSIS — E7800 Pure hypercholesterolemia, unspecified: Secondary | ICD-10-CM | POA: Diagnosis not present

## 2023-04-14 DIAGNOSIS — E1142 Type 2 diabetes mellitus with diabetic polyneuropathy: Secondary | ICD-10-CM

## 2023-04-14 DIAGNOSIS — J301 Allergic rhinitis due to pollen: Secondary | ICD-10-CM

## 2023-04-14 DIAGNOSIS — E663 Overweight: Secondary | ICD-10-CM | POA: Diagnosis not present

## 2023-04-14 DIAGNOSIS — U071 COVID-19: Secondary | ICD-10-CM | POA: Diagnosis present

## 2023-04-14 DIAGNOSIS — Z800 Family history of malignant neoplasm of digestive organs: Secondary | ICD-10-CM | POA: Diagnosis not present

## 2023-04-14 DIAGNOSIS — Z87440 Personal history of urinary (tract) infections: Secondary | ICD-10-CM | POA: Diagnosis not present

## 2023-04-14 DIAGNOSIS — A419 Sepsis, unspecified organism: Secondary | ICD-10-CM | POA: Diagnosis present

## 2023-04-14 DIAGNOSIS — Z7985 Long-term (current) use of injectable non-insulin antidiabetic drugs: Secondary | ICD-10-CM | POA: Diagnosis not present

## 2023-04-14 DIAGNOSIS — K219 Gastro-esophageal reflux disease without esophagitis: Secondary | ICD-10-CM | POA: Diagnosis not present

## 2023-04-14 DIAGNOSIS — Z885 Allergy status to narcotic agent status: Secondary | ICD-10-CM | POA: Diagnosis not present

## 2023-04-14 LAB — CBC WITH DIFFERENTIAL/PLATELET
Abs Immature Granulocytes: 0.05 10*3/uL (ref 0.00–0.07)
Basophils Absolute: 0 10*3/uL (ref 0.0–0.1)
Basophils Relative: 0 %
Eosinophils Absolute: 0 10*3/uL (ref 0.0–0.5)
Eosinophils Relative: 0 %
HCT: 41 % (ref 36.0–46.0)
Hemoglobin: 13 g/dL (ref 12.0–15.0)
Immature Granulocytes: 0 %
Lymphocytes Relative: 14 %
Lymphs Abs: 1.7 10*3/uL (ref 0.7–4.0)
MCH: 30.7 pg (ref 26.0–34.0)
MCHC: 31.7 g/dL (ref 30.0–36.0)
MCV: 96.9 fL (ref 80.0–100.0)
Monocytes Absolute: 1.4 10*3/uL — ABNORMAL HIGH (ref 0.1–1.0)
Monocytes Relative: 11 %
Neutro Abs: 9.2 10*3/uL — ABNORMAL HIGH (ref 1.7–7.7)
Neutrophils Relative %: 75 %
Platelets: 258 10*3/uL (ref 150–400)
RBC: 4.23 MIL/uL (ref 3.87–5.11)
RDW: 13.6 % (ref 11.5–15.5)
WBC: 12.4 10*3/uL — ABNORMAL HIGH (ref 4.0–10.5)
nRBC: 0 % (ref 0.0–0.2)

## 2023-04-14 LAB — URINALYSIS, W/ REFLEX TO CULTURE (INFECTION SUSPECTED)
Bacteria, UA: NONE SEEN
Bilirubin Urine: NEGATIVE
Glucose, UA: >=500 mg/dL — AB
Hgb urine dipstick: NEGATIVE
Ketones, ur: 5 mg/dL — AB
Nitrite: NEGATIVE
Protein, ur: NEGATIVE mg/dL
Specific Gravity, Urine: 1.021 (ref 1.005–1.030)
pH: 5 (ref 5.0–8.0)

## 2023-04-14 LAB — C-REACTIVE PROTEIN
CRP: 5.8 mg/dL — ABNORMAL HIGH (ref <1.0)
CRP: 7.2 mg/dL — ABNORMAL HIGH (ref <1.0)

## 2023-04-14 LAB — HEMOGLOBIN A1C
Hgb A1c MFr Bld: 8.7 % — ABNORMAL HIGH (ref 4.8–5.6)
Mean Plasma Glucose: 202.99 mg/dL

## 2023-04-14 LAB — GLUCOSE, CAPILLARY
Glucose-Capillary: 150 mg/dL — ABNORMAL HIGH (ref 70–99)
Glucose-Capillary: 172 mg/dL — ABNORMAL HIGH (ref 70–99)
Glucose-Capillary: 225 mg/dL — ABNORMAL HIGH (ref 70–99)
Glucose-Capillary: 211 mg/dL — ABNORMAL HIGH (ref 70–99)

## 2023-04-14 LAB — VITAMIN B12: Vitamin B-12: 150 pg/mL — ABNORMAL LOW (ref 180–914)

## 2023-04-14 LAB — COMPREHENSIVE METABOLIC PANEL
ALT: 13 U/L (ref 0–44)
AST: 13 U/L — ABNORMAL LOW (ref 15–41)
Albumin: 3.3 g/dL — ABNORMAL LOW (ref 3.5–5.0)
Alkaline Phosphatase: 44 U/L (ref 38–126)
Anion gap: 10 (ref 5–15)
BUN: 14 mg/dL (ref 8–23)
CO2: 24 mmol/L (ref 22–32)
Calcium: 8.3 mg/dL — ABNORMAL LOW (ref 8.9–10.3)
Chloride: 106 mmol/L (ref 98–111)
Creatinine, Ser: 0.87 mg/dL (ref 0.44–1.00)
GFR, Estimated: >60 mL/min (ref >60)
Glucose, Bld: 141 mg/dL — ABNORMAL HIGH (ref 70–99)
Potassium: 3.8 mmol/L (ref 3.5–5.1)
Sodium: 140 mmol/L (ref 135–145)
Total Bilirubin: 0.9 mg/dL (ref 0.3–1.2)
Total Protein: 6.6 g/dL (ref 6.5–8.1)

## 2023-04-14 LAB — TSH: TSH: 0.566 u[IU]/mL (ref 0.350–4.500)

## 2023-04-14 LAB — PROCALCITONIN: Procalcitonin: 0.13 ng/mL

## 2023-04-14 LAB — MAGNESIUM
Magnesium: 2.3 mg/dL (ref 1.7–2.4)
Magnesium: 2.2 mg/dL (ref 1.7–2.4)

## 2023-04-14 LAB — PHOSPHORUS: Phosphorus: 3.2 mg/dL (ref 2.5–4.6)

## 2023-04-14 MED ORDER — LINAGLIPTIN 5 MG PO TABS
5.0000 mg | ORAL_TABLET | Freq: Every day | ORAL | Status: DC
  Administered 2023-04-14 – 2023-04-15 (×2): 5 mg via ORAL
  Filled 2023-04-14 (×3): qty 1

## 2023-04-14 MED ORDER — BENZONATATE 100 MG PO CAPS: 100.0000 mg | ORAL_CAPSULE | Freq: Three times a day (TID) | ORAL | Status: DC | PRN

## 2023-04-14 MED ORDER — LORATADINE 10 MG PO TABS
10.0000 mg | ORAL_TABLET | Freq: Every day | ORAL | Status: DC
  Administered 2023-04-14 – 2023-04-15 (×2): 10 mg via ORAL
  Filled 2023-04-14 (×2): qty 1

## 2023-04-14 MED ORDER — ACETAMINOPHEN 650 MG RE SUPP: 650.0000 mg | Freq: Four times a day (QID) | RECTAL | Status: DC | PRN

## 2023-04-14 MED ORDER — ACETAMINOPHEN 325 MG PO TABS: 650.0000 mg | ORAL_TABLET | Freq: Four times a day (QID) | ORAL | Status: DC | PRN

## 2023-04-14 MED ORDER — GABAPENTIN 100 MG PO CAPS
100.0000 mg | ORAL_CAPSULE | Freq: Three times a day (TID) | ORAL | Status: DC
  Administered 2023-04-14 – 2023-04-15 (×4): 100 mg via ORAL
  Filled 2023-04-14 (×4): qty 1

## 2023-04-14 MED ORDER — ORAL CARE MOUTH RINSE: 15.0000 mL | OROMUCOSAL | Status: DC | PRN

## 2023-04-14 MED ORDER — ROSUVASTATIN CALCIUM 5 MG PO TABS
5.0000 mg | ORAL_TABLET | Freq: Every day | ORAL | Status: DC
  Filled 2023-04-14: qty 1

## 2023-04-14 MED ORDER — INSULIN ASPART 100 UNIT/ML IJ SOLN
0.0000 [IU] | Freq: Three times a day (TID) | INTRAMUSCULAR | Status: DC
  Administered 2023-04-14: 3 [IU] via SUBCUTANEOUS
  Administered 2023-04-14: 2 [IU] via SUBCUTANEOUS
  Administered 2023-04-14: 1 [IU] via SUBCUTANEOUS
  Administered 2023-04-15: 3 [IU] via SUBCUTANEOUS
  Administered 2023-04-15: 2 [IU] via SUBCUTANEOUS
  Filled 2023-04-14: qty 0.09

## 2023-04-14 MED ORDER — LACTATED RINGERS IV SOLN: INTRAVENOUS | Status: AC

## 2023-04-14 MED ORDER — NIRMATRELVIR/RITONAVIR (PAXLOVID)TABLET
3.0000 | ORAL_TABLET | Freq: Two times a day (BID) | ORAL | Status: DC
  Administered 2023-04-14 – 2023-04-15 (×3): 3 via ORAL
  Filled 2023-04-14: qty 30

## 2023-04-14 MED ORDER — ONDANSETRON HCL 4 MG/2ML IJ SOLN: 4.0000 mg | Freq: Four times a day (QID) | INTRAMUSCULAR | Status: DC | PRN

## 2023-04-14 MED ORDER — ROSUVASTATIN CALCIUM 5 MG PO TABS: 5.0000 mg | ORAL_TABLET | Freq: Every day | ORAL | Status: DC

## 2023-04-14 MED ORDER — METHYLPREDNISOLONE SODIUM SUCC 40 MG IJ SOLR
0.5000 mg/kg | Freq: Two times a day (BID) | INTRAMUSCULAR | Status: DC
  Administered 2023-04-14 – 2023-04-15 (×3): 36.4 mg via INTRAVENOUS
  Filled 2023-04-14 (×3): qty 1

## 2023-04-14 MED ORDER — PREDNISONE 50 MG PO TABS: 50.0000 mg | ORAL_TABLET | Freq: Every day | ORAL | Status: DC

## 2023-04-14 MED ORDER — ALBUTEROL SULFATE (2.5 MG/3ML) 0.083% IN NEBU: 2.5000 mg | INHALATION_SOLUTION | RESPIRATORY_TRACT | Status: DC | PRN

## 2023-04-14 MED ORDER — MELATONIN 3 MG PO TABS: 3.0000 mg | ORAL_TABLET | Freq: Every evening | ORAL | Status: DC | PRN

## 2023-04-14 NOTE — Evaluation (Signed)
Physical Therapy Evaluation-1x Patient Details Name: Barbara Golden MRN: [id number] DOB: July 26, 1935 Today's Date: 04/14/2023  History of Present Illness  87 y.o. female admitted 04/13/23 with COVID-19 infection complaining of fever, generalized myalgias/weakness. PMH includes type 2 diabetes mellitus, gated by diabetic peripheral polyneuropathy, hyperlipidemia, allergic rhinitis.  Clinical Impression  On eval, pt was Supv-Mod Ind for mobility. She walked ~200 feet with a RW. Tolerated distance well. O2 >90% on RA throughout session. Do not anticipate any f/u PT needs at this time. Recommend daily ambulation around the unit with nursing and/or mobility team. 1x eval. Will sign off.         If plan is discharge home, recommend the following:     Can travel by private vehicle        Equipment Recommendations None recommended by PT  Recommendations for Other Services       Functional Status Assessment Patient has had a recent decline in their functional status and demonstrates the ability to make significant improvements in function in a reasonable and predictable amount of time.     Precautions / Restrictions Precautions Precautions: Fall Precaution Comments: COVID + Restrictions Weight Bearing Restrictions: No      Mobility  Bed Mobility               General bed mobility comments: oob in recliner    Transfers Overall transfer level: Modified independent                      Ambulation/Gait Ambulation/Gait assistance: Supervision Gait Distance (Feet): 200 Feet Assistive device: Rolling walker (2 wheels)         General Gait Details: Pt intermittently bumped into objects in the environment (likely due to visual deficits per pt report). No LOB. Pt denied dizziness. Tolerated distance well. O2 95% on RA. Dyspnea 2/4 towards end of walk  Stairs            Wheelchair Mobility     Tilt Bed    Modified Rankin (Stroke Patients Only)        Balance Overall balance assessment: Mild deficits observed, not formally tested                                           Pertinent Vitals/Pain Pain Assessment Pain Assessment: No/denies pain    Home Living Family/patient expects to be discharged to:: Private residence Living Arrangements: Children;Other relatives (daughter and son in law) Available Help at Discharge: Family;Available 24 hours/day Type of Home: House Home Access: Stairs to enter Entrance Stairs-Rails: Right;Left;Can reach both Entrance Stairs-Number of Steps: 4 at front, 1 step in back   Home Layout: One level Home Equipment: Cane - single point;Shower seat;Hand held Stage manager (4 wheels) Additional Comments: has a toy poodle named Chief Operating Officer"    Prior Function Prior Level of Function : Independent/Modified Independent             Mobility Comments: in house uses Rollator, but if its tight she uses SPC, uses rascals in shopping stores like [employer]. one fall in last 6 months due to the dogs tripping her up. otherwise no falls ADLs Comments: does her own bathing/dressing     Extremity/Trunk Assessment   Upper Extremity Assessment Upper Extremity Assessment: Defer to OT evaluation    Lower Extremity Assessment Lower Extremity Assessment: Generalized weakness    Cervical / Trunk Assessment  Cervical / Trunk Assessment: Kyphotic  Communication   Communication Communication: No apparent difficulties  Cognition Arousal: Alert Behavior During Therapy: WFL for tasks assessed/performed Overall Cognitive Status: Within Functional Limits for tasks assessed                                          General Comments General comments (skin integrity, edema, etc.): HR around 90 prior, during, and after activity. On RA throughout session and SpO2 >93%    Exercises     Assessment/Plan    PT Assessment Patient does not need any further PT services  PT Problem List          PT Treatment Interventions      PT Goals (Current goals can be found in the Care Plan section)  Acute Rehab PT Goals Patient Stated Goal: home soon PT Goal Formulation: All assessment and education complete, DC therapy    Frequency       Co-evaluation               AM-PAC PT "6 Clicks" Mobility  Outcome Measure Help needed turning from your back to your side while in a flat bed without using bedrails?: None Help needed moving from lying on your back to sitting on the side of a flat bed without using bedrails?: None Help needed moving to and from a bed to a chair (including a wheelchair)?: None Help needed standing up from a chair using your arms (e.g., wheelchair or bedside chair)?: None Help needed to walk in hospital room?: A Little Help needed climbing 3-5 steps with a railing? : A Little 6 Click Score: 22    End of Session Equipment Utilized During Treatment: Gait belt Activity Tolerance: Patient tolerated treatment well Patient left: in bed;with call bell/phone within reach        Time: 1139-1216 PT Time Calculation (min) (ACUTE ONLY): 37 min   Charges:   PT Evaluation $PT Eval Low Complexity: 1 Low PT Treatments $Gait Training: 8-22 mins PT General Charges $$ ACUTE PT VISIT: 1 Visit            Faye Ramsay, PT Acute Rehabilitation  Office: [phone]

## 2023-04-14 NOTE — Evaluation (Signed)
Occupational Therapy Evaluation and Discharge from OT Patient Details Name: Barbara Golden MRN: [id number] DOB: 08/20/1935 Today's Date: 04/14/2023   History of Present Illness Barbara Golden is a 87 y.o. female admitted 04/13/23 with COVID-19 infection complaining of fever, generalized myalgias/weakness. PMH includes type 2 diabetes mellitus, gated by diabetic peripheral polyneuropathy, hyperlipidemia, allergic rhinitis.   Clinical Impression   Pt is typically mod I in ADL and mobility with Rollator/SPC/power cart for shopping. She reports feeling much better this morning. She was able to demonstrate UB and LB ADL, room navigation and ambulation with RW and without DME (OT pushing IV pole). HR remained around 90 at rest, and with activity. Few and infrequent coughs - but SpO2 >90% on RA. 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 However she was able to find her food on her breakfast tray, open containers and feed herself without any assist. Pt at baseline functioning and OT will sign off at this time as education is complete and Pt with no further questions or concerns.        If plan is discharge home, recommend the following: A little help with walking and/or transfers;A little help with bathing/dressing/bathroom;Assist for transportation;Help with stairs or ramp for entrance    Functional Status Assessment  Patient has had a recent decline in their functional status and demonstrates the ability to make significant improvements in function in a reasonable and predictable amount of time.  Equipment Recommendations  None recommended by OT (Pt has appropriate DME)    Recommendations for Other Services       Precautions / Restrictions Precautions Precautions: Fall Precaution Comments: COVID + Restrictions Weight  Bearing Restrictions: No      Mobility Bed Mobility               General bed mobility comments: unobserver - Pt states that she did it without assist and without use of rails    Transfers Overall transfer level: Needs assistance Equipment used: Rolling walker (2 wheels), None Transfers: Sit to/from Stand Sit to Stand: Contact guard assist           General transfer comment: CGA for safety, no physical assist. good hand placement      Balance Overall balance assessment: Mild deficits observed, not formally tested                                         ADL either performed or assessed with clinical judgement   ADL Overall ADL's : At baseline                                       General ADL Comments: Pt able to demonstrate LB ADL doff/don socks. ambulate to bathroom, perform transfers, feed herself     Vision Baseline Vision/History: 6 Macular Degeneration Ability to See in Adequate Light: 2 Moderately impaired Patient Visual Report: No change from baseline Additional Comments: L eye is better than R eye. 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     Perception         Praxis  Pertinent Vitals/Pain Pain Assessment Pain Assessment: No/denies pain     Extremity/Trunk Assessment Upper Extremity Assessment Upper Extremity Assessment: Generalized weakness;Overall WFL for tasks assessed   Lower Extremity Assessment Lower Extremity Assessment: Defer to PT evaluation   Cervical / Trunk Assessment Cervical / Trunk Assessment: Kyphotic   Communication Communication Communication: No apparent difficulties   Cognition Arousal: Alert Behavior During Therapy: WFL for tasks assessed/performed Overall Cognitive Status: Within Functional Limits for tasks assessed                                       General Comments  HR around 90 prior, during, and after activity. On RA throughout session and SpO2  >93%    Exercises     Shoulder Instructions      Home Living Family/patient expects to be discharged to:: Private residence Living Arrangements: Children;Other relatives (daughter and son in law) Available Help at Discharge: Family;Available 24 hours/day Type of Home: House Home Access: Stairs to enter [employer] of Steps: 4 at front, 1 step in back Entrance Stairs-Rails: Right;Left;Can reach both Home Layout: One level     Bathroom Shower/Tub: Chief Strategy Officer: Standard Bathroom Accessibility: No   Home Equipment: Cane - single point;Shower seat;Hand held Stage manager (4 wheels)   Additional Comments: has a toy poodle named Chief Operating Officer"      Prior Functioning/Environment Prior Level of Function : Independent/Modified Independent             Mobility Comments: in house uses Rollator, but if its tight she uses SPC, uses rascals in shopping stores like [employer]. one fall in last 6 months due to the dogs tripping her up. otherwise no falls ADLs Comments: does her own bathing/dressing        OT Problem List: Decreased activity tolerance      OT Treatment/Interventions:      OT Goals(Current goals can be found in the care plan section) Acute Rehab OT Goals Patient Stated Goal: get home to her poodle "Barbara Golden" OT Goal Formulation: With patient Time For Goal Achievement: 04/28/23 Potential to Achieve Goals: Good  OT Frequency:      Co-evaluation              AM-PAC OT "6 Clicks" Daily Activity     Outcome Measure Help from another person eating meals?: None Help from another person taking care of personal grooming?: None Help from another person toileting, which includes using toliet, bedpan, or urinal?: None Help from another person bathing (including washing, rinsing, drying)?: A Little Help from another person to put on and taking off regular upper body clothing?: None Help from another person to put on and taking off  regular lower body clothing?: None 6 Click Score: 23   End of Session Equipment Utilized During Treatment: Gait belt;Rolling walker (2 wheels) Nurse Communication: Mobility status  Activity Tolerance: Patient tolerated treatment well Patient left: in chair;with call bell/phone within reach  OT Visit Diagnosis: Muscle weakness (generalized) (M62.81)                Time: 1308-6578 OT Time Calculation (min): 33 min Charges:  OT General Charges $OT Visit: 1 Visit OT Evaluation $OT Eval Low Complexity: 1 Low OT Treatments $Self Care/Home Management : 8-22 mins  Nyoka Cowden OTR/L Acute Rehabilitation Services Office: [phone]  Evern Bio [facility] 04/14/2023, 10:34 AM

## 2023-04-14 NOTE — Plan of Care (Signed)
Problem: Education: Goal: Knowledge of risk factors and measures for prevention of condition will improve Outcome: Progressing   Problem: Respiratory: Goal: Will maintain a patent airway Outcome: Progressing Goal: Complications related to the disease process, condition or treatment will be avoided or minimized Outcome: Progressing

## 2023-04-14 NOTE — ED Notes (Signed)
ED TO INPATIENT HANDOFF REPORT  ED Nurse Name and Phone #: Lanora Manis [phone]  S Name/Age/Gender Barbara Golden 87 y.o. female Room/Bed: WA11/WA11  Code Status   Code Status: Full Code  Home/SNF/Other Home Patient oriented to: self, place, time, and situation Is this baseline? Yes   Triage Complete: Triage complete  Chief Complaint COVID-19 [U07.1]  Triage Note Weakness and unsteady gait when pt woke up around 1100. Pt has had fatigue and fever, non-productive cough. Pt lives with daughter and son-in-law, son-in-law is covid positive. Pt is slightly hypotensive in triage.    Allergies Allergies  Allergen Reactions   Ammonia    Codeine Other (See Comments)    Causes lip burning and hyperactivity    Level of Care/Admitting Diagnosis ED Disposition     ED Disposition  Admit   Condition  --   Comment  Hospital Area: [facility] [[id number]]  Level of Care: Telemetry [5]  Admit to tele based on following criteria: Monitor for Ischemic changes  May admit patient to [facility] or [facility] if equivalent level of care is available:: No  Covid Evaluation: Confirmed COVID Positive  Diagnosis: COVID-19 [[id number]]  Admitting Physician: Angie Fava [[id number]]  Attending Physician: Angie Fava [[id number]]  Certification:: I certify this patient will need inpatient services for at least 2 midnights  Expected Medical Readiness: 04/16/2023          B Medical/Surgery History Past Medical History:  Diagnosis Date   Allergy    Cancer (HCC)    endometrial   Cataract    Diabetes mellitus without complication (HCC)    type 2   GERD (gastroesophageal reflux disease)    High cholesterol    Jaundice    as a child   Macular degeneration    Nuclear sclerotic cataract of both eyes 09/05/2020   Varicose veins of lower extremity    right leg   Past Surgical History:  Procedure Laterality Date   ROBOTIC ASSISTED TOTAL HYSTERECTOMY WITH  BILATERAL SALPINGO OOPHERECTOMY Bilateral 09/11/2020   Procedure: XI ROBOTIC ASSISTED TOTAL HYSTERECTOMY WITH BILATERAL SALPINGO OOPHORECTOMY;  Surgeon: Adolphus Birchwood, MD;  Location: [facility];  Service: Gynecology;  Laterality: Bilateral;   SENTINEL NODE BIOPSY N/A 09/11/2020   Procedure: SENTINEL NODE BIOPSY;  Surgeon: Adolphus Birchwood, MD;  Location: [facility];  Service: Gynecology;  Laterality: N/A;   TUBAL LIGATION Bilateral      A IV Location/Drains/Wounds Patient Lines/Drains/Airways Status     Active Line/Drains/Airways     Name Placement date Placement time Site Days   Peripheral IV 04/13/23 20 G Posterior;Right Forearm 04/13/23  2158  Forearm  1   Incision - 4 Ports Abdomen 1: Right;Lateral 2: Umbilicus;Upper 3: Left;Lateral;Upper 4: Left;Lateral;Mid 09/11/20  0810  -- 945            Intake/Output Last 24 hours  Intake/Output Summary (Last 24 hours) at 04/14/2023 0045 Last data filed at 04/14/2023 0043 Gross per 24 hour  Intake 1182.25 ml  Output --  Net 1182.25 ml    Labs/Imaging Results for orders placed or performed during the hospital encounter of 04/13/23 (from the past 48 hour(s))  CBG monitoring, ED     Status: Abnormal   Collection Time: 04/13/23  8:53 PM  Result Value Ref Range   Glucose-Capillary 237 (H) 70 - 99 mg/dL    Comment: Glucose reference range applies only to samples taken after fasting for at least 8 hours.  SARS Coronavirus 2 by RT PCR (  hospital order, performed in [facility] hospital lab) *cepheid single result test* Anterior Nasal Swab     Status: Abnormal   Collection Time: 04/13/23  9:57 PM   Specimen: Anterior Nasal Swab  Result Value Ref Range   SARS Coronavirus 2 by RT PCR POSITIVE (A) NEGATIVE    Comment: (NOTE) SARS-CoV-2 target nucleic acids are DETECTED  SARS-CoV-2 RNA is generally detectable in upper respiratory specimens  during the acute phase of infection.  Positive results are indicative  of the presence of the identified virus, but do  not rule out bacterial infection or co-infection with other pathogens not detected by the test.  Clinical correlation with patient history and  other diagnostic information is necessary to determine patient infection status.  The expected result is negative.  Fact Sheet for Patients:   RoadLapTop.co.za   Fact Sheet for Healthcare Providers:   http://kim-miller.com/    This test is not yet approved or cleared by the [place] FDA and  has been authorized for detection and/or diagnosis of SARS-CoV-2 by FDA under an Emergency Use Authorization (EUA).  This EUA will remain in effect (meaning this test can be used) for the duration of  the COVID-19 declaration under Section 564(b)(1)  of the Act, 21 U.S.C. section 360-bbb-3(b)(1), unless the authorization is terminated or revoked sooner.   Performed at [facility] [address], [place], [place] [address]   Comprehensive metabolic panel     Status: Abnormal   Collection Time: 04/13/23  9:57 PM  Result Value Ref Range   Sodium 139 135 - 145 mmol/L   Potassium 3.5 3.5 - 5.1 mmol/L   Chloride 102 98 - 111 mmol/L   CO2 25 22 - 32 mmol/L   Glucose, Bld 193 (H) 70 - 99 mg/dL    Comment: Glucose reference range applies only to samples taken after fasting for at least 8 hours.   BUN 14 8 - 23 mg/dL   Creatinine, Ser 6.04 0.44 - 1.00 mg/dL   Calcium 8.9 8.9 - 54.0 mg/dL   Total Protein 7.5 6.5 - 8.1 g/dL   Albumin 3.7 3.5 - 5.0 g/dL   AST 16 15 - 41 U/L   ALT 14 0 - 44 U/L   Alkaline Phosphatase 54 38 - 126 U/L   Total Bilirubin 1.1 0.3 - 1.2 mg/dL   GFR, Estimated 57 (L) >60 mL/min    Comment: (NOTE) Calculated using the CKD-EPI Creatinine Equation (2021)    Anion gap 12 5 - 15    Comment: Performed at [facility], [address] [place], [place] [address]  CBC with Differential     Status: Abnormal   Collection Time: 04/13/23  9:57 PM   Result Value Ref Range   WBC 13.8 (H) 4.0 - 10.5 K/uL   RBC 4.60 3.87 - 5.11 MIL/uL   Hemoglobin 14.1 12.0 - 15.0 g/dL   HCT 14.7 82.9 - 56.2 %   MCV 93.7 80.0 - 100.0 fL   MCH 30.7 26.0 - 34.0 pg   MCHC 32.7 30.0 - 36.0 g/dL   RDW 13.0 86.5 - 78.4 %   Platelets 294 150 - 400 K/uL   nRBC 0.0 0.0 - 0.2 %   Neutrophils Relative % 80 %   Neutro Abs 10.9 (H) 1.7 - 7.7 K/uL   Lymphocytes Relative 13 %   Lymphs Abs 1.7 0.7 - 4.0 K/uL   Monocytes Relative 7 %   Monocytes Absolute 1.0 0.1 - 1.0  K/uL   Eosinophils Relative 0 %   Eosinophils Absolute 0.0 0.0 - 0.5 K/uL   Basophils Relative 0 %   Basophils Absolute 0.1 0.0 - 0.1 K/uL   Immature Granulocytes 0 %   Abs Immature Granulocytes 0.06 0.00 - 0.07 K/uL    Comment: Performed at [facility], [address] [place], [place] [address]  Magnesium     Status: None   Collection Time: 04/13/23  9:57 PM  Result Value Ref Range   Magnesium 2.3 1.7 - 2.4 mg/dL    Comment: Performed at [facility], [address], [place], [place] [address]  I-Stat Lactic Acid, ED     Status: None   Collection Time: 04/13/23 11:16 PM  Result Value Ref Range   Lactic Acid, Venous 1.3 0.5 - 1.9 mmol/L   DG Chest 2 View  Result Date: 04/13/2023 CLINICAL DATA:  Fever. EXAM: CHEST - 2 VIEW COMPARISON:  July 16, 2021 FINDINGS: The heart size and mediastinal contours are within normal limits. There is stable, mild to moderate severity biapical pleural thickening. No acute infiltrate, pleural effusion or pneumothorax is identified. The visualized skeletal structures are unremarkable. IMPRESSION: No active cardiopulmonary disease. Electronically Signed   By: Aram Candela M.D.   On: 04/13/2023 23:14    Pending Labs Unresulted Labs (From admission, onward)     Start     Ordered   04/14/23 0500  CBC with Differential/Platelet  Tomorrow morning,   R        04/14/23 0017   04/14/23 0500  Comprehensive metabolic panel   Tomorrow morning,   R        04/14/23 0017   04/14/23 0500  Magnesium  Tomorrow morning,   R        04/14/23 0017   04/14/23 0500  Phosphorus  Tomorrow morning,   R        04/14/23 0017   04/14/23 0500  C-reactive protein  Tomorrow morning,   R        04/14/23 0027   04/14/23 0030  Hemoglobin A1c  Add-on,   AD       Comments: To assess prior glycemic control    04/14/23 0029   04/14/23 0029  Vitamin B12  Add-on,   AD        04/14/23 0028   04/14/23 0029  TSH  Add-on,   AD        04/14/23 0028   04/14/23 0028  C-reactive protein  Add-on,   AD        04/14/23 0027   04/14/23 0028  Procalcitonin  Add-on,   AD       References:    Procalcitonin Lower Respiratory Tract Infection AND Sepsis Procalcitonin Algorithm   04/14/23 0027   04/13/23 2205  Blood Culture (routine x 2)  (Septic presentation on arrival (screening labs, nursing and treatment orders for obvious sepsis))  BLOOD CULTURE X 2,   STAT      04/13/23 2205   04/13/23 2205  Urinalysis, w/ Reflex to Culture (Infection Suspected) -Urine, Clean Catch  (Septic presentation on arrival (screening labs, nursing and treatment orders for obvious sepsis))  ONCE - URGENT,   URGENT       Question:  Specimen Source  Answer:  Urine, Clean Catch   04/13/23 2205   04/13/23 2047  Urinalysis, Routine w reflex microscopic -Urine, Clean Catch  Once,   URGENT       Question:  Specimen  Source  Answer:  Urine, Clean Catch   04/13/23 2046            Vitals/Pain Today's Vitals   04/13/23 2130 04/14/23 0000 04/14/23 0020 04/14/23 0030  BP: (!) 119/54 (!) 147/60  (!) 141/53  Pulse: 95 89  88  Resp: (!) 21 (!) 23  19  Temp:    99.2 F (37.3 C)  TempSrc:    Oral  SpO2: 97% 98%  97%  Weight:      Height:      PainSc:   0-No pain     Isolation Precautions Airborne and Contact precautions  Medications Medications  cefTRIAXone (ROCEPHIN) 1 g in sodium chloride 0.9 % 100 mL IVPB (0 g Intravenous Stopped 04/14/23 0043)  azithromycin  (ZITHROMAX) 500 mg in sodium chloride 0.9 % 250 mL IVPB (500 mg Intravenous New Bag/Given 04/14/23 0039)  lactated ringers infusion ( Intravenous New Bag/Given 04/14/23 0040)  acetaminophen (TYLENOL) tablet 650 mg (has no administration in time range)    Or  acetaminophen (TYLENOL) suppository 650 mg (has no administration in time range)  melatonin tablet 3 mg (has no administration in time range)  ondansetron (ZOFRAN) injection 4 mg (has no administration in time range)  linagliptin (TRADJENTA) tablet 5 mg (has no administration in time range)  benzonatate (TESSALON) capsule 100 mg (has no administration in time range)  albuterol (PROVENTIL) (2.5 MG/3ML) 0.083% nebulizer solution 2.5 mg (has no administration in time range)  insulin aspart (novoLOG) injection 0-9 Units (has no administration in time range)  loratadine (CLARITIN) tablet 10 mg (has no administration in time range)  gabapentin (NEURONTIN) capsule 100 mg (has no administration in time range)  rosuvastatin (CRESTOR) tablet 5 mg (has no administration in time range)  lactated ringers bolus 1,000 mL (0 mLs Intravenous Stopped 04/13/23 2359)  acetaminophen (TYLENOL) tablet 650 mg (650 mg Oral Given 04/13/23 2258)    Mobility walks     Focused Assessments Pulmonary Assessment Handoff:  Lung sounds:   O2 Device: Room Air      R Recommendations: See Admitting Provider Note  Report given to:   Additional Notes:

## 2023-04-14 NOTE — Progress Notes (Signed)
Care started prior to midnight in the emergency room and patient was admitted early this morning after midnight by Dr. Newton Pigg I am in current agreement with this assessment and plan.  Additional changes to the plan of care been made accordingly.  The patient is a elderly overweight Caucasian female with a past medical history significant for but limited to diabetes mellitus type 2 complicated by diabetic peripheral polyneuropathy, hyperlipidemia, allergic rhinitis as well as other comorbidities who presented to the hospital with a fever and a cough.  She reported 1 day subjective fever at home and she noticed a new onset of nonproductive cough as well as generalized weakness but no chills or body rigors.  Creatinine shortness of breath and presented to hospital and was found to be febrile and further workup revealed that she had COVID-19 disease.  X-ray was done and showed no evidence of any infiltrate or edema or effusion.  She is given lactated ringer bolus 1 L, started IV fluid hydration and given acetaminophen and azithromycin and Rocephin.  Given her worsening CRP and inflammatory marker we have initiated Paxlovid and steroids.  PT OT evaluated the patient and recommending no follow-up as of now.  Currently she is being admitted for and treated for the following but not limited to:  COVID-19 disease which we have now started her on Paxlovid and IV steroids.  She will need an amatory home O2 screen prior to discharge and repeat inflammatory markers in the a.m. as well as chest x-ray in the AM.  Diabetes mellitus type 2 which will need to continue monitor blood sugars given that she is initiated on systemic corticosteroids  Sepsis secondary to COVID-19 disease: Given IV fluids and blood cultures x 2 obtained and getting continuous IV fluids for 12 more hours.  She received azithromycin and Rocephin in the ED and we will checking procalcitonin level and repeat chest x-ray in the  a.m.  Generalized weakness and PT OT evaluated recommending a follow-up.  Getting IV fluid hydration  Hyperlipidemia for which we will continue her statin   Allergic rhinitis other comorbidities.  If inflammatory markers improving patient feels well is improved can likely be discharged in next 24 to 48 hours but in the interim we will continue to monitor her clinical response to intervention and repeat blood work and imaging in the AM.

## 2023-04-14 NOTE — H&P (Signed)
History and Physical      Barbara Golden ZOX:[id number] DOB: 1935-12-30 DOA: 04/13/2023; DOS: 04/14/2023  PCP: [place], Betty G, MD  Patient coming from: home   I have personally briefly reviewed patient's old medical records in [facility]  Chief Complaint: fever  HPI: Barbara Golden is a 87 y.o. female with medical history significant for type 2 diabetes mellitus, gated by diabetic peripheral polyneuropathy, hyperlipidemia, allergic rhinitis, who is admitted to [facility] on 04/13/2023 with COVID-19 infection after presenting from home to [facility] ED complaining of fever.   The patient reports 1 day of objective fever at home, during which time she has noted associated new onset nonproductive cough as well as generalized myalgias in the absence of chills or full body rigors.  Denies associated shortness of breath, headache, neck stiffness, rash, abdominal pain, nausea, vomiting, diarrhea.  She also denies any recent dysuria or gross hematuria.  Denies any known history of chronic underlying pulmonary pathology.  Medical history notable for type 2 diabetes mellitus.  No recent chest pain, palpitations, diaphoresis, or hemoptysis.  She also notes generalized weakness over the last day, in the absence of any acute focal weakness.     ED Course:  Vital signs in the ED were notable for the following: Temperature max 101.1; heart rates in the 80s to 90s; systolic pressures in the 1 teens 140s; respiratory rate 16-23, oxygen saturation 97 to 98% on room air.  Labs were notable for the following: CMP notable for the following: Sodium 139, bicarbonate 27, creatinine 0.98, glucose 193, liver enzymes within normal limits.  Initial lactic acid 1.3.  CBC notable for white cell count 13,800 with 80% neutrophils.  Urinalysis ordered, Therazole currently pending.  Blood cultures x 2 were collected prior to initiation of IV antibiotics.  COVID-19 PCR positive.  Per my interpretation, EKG in ED  demonstrated the following: Sinus rhythm with heart rate 96, normal intervals, nonspecific T wave flattening in inferior leads, otherwise no T wave changes nor any ST changes, including no evidence of ST elevation.  Imaging in the ED, per corresponding formal radiology read, was notable for the following: 2 view chest x-ray showed no evidence of acute cardiopulmonary process, including no evidence of infiltrate edema, effusion, or pneumothorax.  While in the ED, the following were administered: Acetaminophen 650 mg p.o. x 1 dose, prior to COVID-19 PCR positive result, the patient was received azithromycin, Rocephin; lactated Ringer's x 1 L bolus followed by initiation continuous LR running at 150 cc/h.  Subsequently, the patient was admitted COVID-19 infection complicated by sepsis as well as generalized weakness.    Review of Systems: As per HPI otherwise 10 point review of systems negative.   Past Medical History:  Diagnosis Date   Allergy    Cancer (HCC)    endometrial   Cataract    Diabetes mellitus without complication (HCC)    type 2   GERD (gastroesophageal reflux disease)    High cholesterol    Jaundice    as a child   Macular degeneration    Nuclear sclerotic cataract of both eyes 09/05/2020   Varicose veins of lower extremity    right leg    Past Surgical History:  Procedure Laterality Date   ROBOTIC ASSISTED TOTAL HYSTERECTOMY WITH BILATERAL SALPINGO OOPHERECTOMY Bilateral 09/11/2020   Procedure: XI ROBOTIC ASSISTED TOTAL HYSTERECTOMY WITH BILATERAL SALPINGO OOPHORECTOMY;  Surgeon: Adolphus Birchwood, MD;  Location: [facility] ORS;  Service: Gynecology;  Laterality: Bilateral;   SENTINEL  NODE BIOPSY N/A 09/11/2020   Procedure: SENTINEL NODE BIOPSY;  Surgeon: Adolphus Birchwood, MD;  Location: [facility] ORS;  Service: Gynecology;  Laterality: N/A;   TUBAL LIGATION Bilateral     Social History:  reports that she quit smoking about 14 years ago. Her smoking use included cigarettes. She has never used  smokeless tobacco. She reports that she does not drink alcohol and does not use drugs.   Allergies  Allergen Reactions   Ammonia    Codeine Other (See Comments)    Causes lip burning and hyperactivity    Family History  Problem Relation Age of Onset   Colon cancer Mother    Cancer Mother        colon/stomach   Heart disease Father    Heart attack Sister    Cancer Nephew        bile duct    Family history reviewed and not pertinent    Prior to Admission medications   Medication Sig Start Date End Date Taking? Authorizing Provider  famotidine (PEPCID) 10 MG tablet Take 10 mg by mouth as needed for heartburn or indigestion.   Yes [provider]  fexofenadine (ALLEGRA ALLERGY) 60 MG tablet 1 po qd   Yes [provider]  gabapentin (NEURONTIN) 100 MG capsule TAKE 1 CAPSULE (100 MG TOTAL) BY MOUTH THREE TIMES DAILY. 12/09/22  Yes [place], Betty G, MD  JARDIANCE 25 MG TABS tablet TAKE 1 TABLET BY MOUTH EVERY DAY BEFORE BREAKFAST 02/16/23  Yes [place], Betty G, MD  Multiple Vitamins-Minerals (PRESERVISION AREDS) CAPS Take 1 capsule by mouth daily. 08/21/20  Yes [provider]  rosuvastatin (CRESTOR) 5 MG tablet Take 5 mg by mouth daily. 08/17/20  Yes [provider]  TRULICITY 1.5 MG/0.5ML SOPN Inject 1.5 mg into the skin every 7 (seven) days. 04/05/23  Yes [provider]  Continuous Blood Gluc Receiver (FREESTYLE LIBRE 14 DAY READER) DEVI 1 Units by Does not apply route 2 (two) times daily. H35.30, E11.65 10/07/19   Reed, Tiffany L, DO  Continuous Blood Gluc Sensor (FREESTYLE LIBRE 14 DAY SENSOR) MISC Apply 1 sensor every 14 days E11.319 06/18/22   [place], Betty G, MD  Dulaglutide (TRULICITY) 3 MG/0.5ML SOPN Inject 3 mg as directed once a week. Patient not taking: Reported on 04/13/2023 10/22/22   [place], Betty G, MD  Elastic Bandages & Supports (MEDICAL COMPRESSION THIGH HIGH) MISC 1 each by Does not apply route daily. 04/12/20   Wieters, Hallie C,  PA-C  cetirizine (ZYRTEC) 10 MG tablet Take 10 mg by mouth daily as needed for allergies.  09/13/19  [provider]     Objective    Physical Exam: Vitals:   04/13/23 2038 04/13/23 2047 04/13/23 2130 04/14/23 0000  BP: (!) 117/47 (!) 124/52 (!) 119/54 (!) 147/60  Pulse:   95 89  Resp:   (!) 21 (!) 23  Temp:      TempSrc:      SpO2:   97% 98%  Weight:      Height:        General: appears to be stated age; alert, oriented Skin: warm, dry, no rash Head:  AT/[place] Mouth:  Oral mucosa membranes appear moist, normal dentition Neck: supple; trachea midline Heart:  RRR; did not appreciate any M/R/G Lungs: CTAB, did not appreciate any wheezes, rales, or rhonchi Abdomen: + BS; soft, ND, NT Vascular: 2+ pedal pulses b/l; 2+ radial pulses b/l Extremities: no peripheral edema, no muscle wasting Neuro: strength and sensation  intact in upper and lower extremities b/l  Labs on Admission: I have personally reviewed following labs and imaging studies  CBC: Recent Labs  Lab 04/13/23 2157  WBC 13.8*  NEUTROABS 10.9*  HGB 14.1  HCT 43.1  MCV 93.7  PLT 294   Basic Metabolic Panel: Recent Labs  Lab 04/13/23 2157  NA 139  K 3.5  CL 102  CO2 25  GLUCOSE 193*  BUN 14  CREATININE 0.98  CALCIUM 8.9  MG 2.3   GFR: Estimated Creatinine Clearance: 41.4 mL/min (by C-G formula based on SCr of 0.98 mg/dL). Liver Function Tests: Recent Labs  Lab 04/13/23 2157  AST 16  ALT 14  ALKPHOS 54  BILITOT 1.1  PROT 7.5  ALBUMIN 3.7   No results for input(s): "LIPASE", "AMYLASE" in the last 168 hours. No results for input(s): "AMMONIA" in the last 168 hours. Coagulation Profile: No results for input(s): "INR", "PROTIME" in the last 168 hours. Cardiac Enzymes: No results for input(s): "CKTOTAL", "CKMB", "CKMBINDEX", "TROPONINI" in the last 168 hours. BNP (last 3 results) No results for input(s): "PROBNP" in the last 8760 hours. HbA1C: No results for input(s): "HGBA1C" in the  last 72 hours. CBG: Recent Labs  Lab 04/13/23 2053  GLUCAP 237*   Lipid Profile: No results for input(s): "CHOL", "HDL", "LDLCALC", "TRIG", "CHOLHDL", "LDLDIRECT" in the last 72 hours. Thyroid Function Tests: No results for input(s): "TSH", "T4TOTAL", "FREET4", "T3FREE", "THYROIDAB" in the last 72 hours. Anemia Panel: No results for input(s): "VITAMINB12", "FOLATE", "FERRITIN", "TIBC", "IRON", "RETICCTPCT" in the last 72 hours. Urine analysis:    Component Value Date/Time   COLORURINE YELLOW 09/06/2020 1143   APPEARANCEUR CLEAR 09/06/2020 1143   LABSPEC 1.023 09/06/2020 1143   PHURINE 5.0 09/06/2020 1143   GLUCOSEU 50 (A) 09/06/2020 1143   HGBUR NEGATIVE 09/06/2020 1143   BILIRUBINUR NEGATIVE 09/06/2020 1143   BILIRUBINUR Negative 07/27/2020 1526   KETONESUR 5 (A) 09/06/2020 1143   PROTEINUR NEGATIVE 09/06/2020 1143   UROBILINOGEN negative (A) 07/27/2020 1526   UROBILINOGEN 0.2 03/27/2015 1641   NITRITE NEGATIVE 09/06/2020 1143   LEUKOCYTESUR SMALL (A) 09/06/2020 1143    Radiological Exams on Admission: DG Chest 2 View  Result Date: 04/13/2023 CLINICAL DATA:  Fever. EXAM: CHEST - 2 VIEW COMPARISON:  July 16, 2021 FINDINGS: The heart size and mediastinal contours are within normal limits. There is stable, mild to moderate severity biapical pleural thickening. No acute infiltrate, pleural effusion or pneumothorax is identified. The visualized skeletal structures are unremarkable. IMPRESSION: No active cardiopulmonary disease. Electronically Signed   By: Aram Candela M.D.   On: 04/13/2023 23:14      Assessment/Plan    Principal Problem:   COVID-19 Active Problems:   DM2 (diabetes mellitus, type 2) (HCC)   Hyperlipidemia   Sepsis (HCC)   Generalized weakness   Allergic rhinitis     #) COVID-19 infection: diagnosis on the basis of: 1 day of subjective fever, cough, with positive COVID-19 PCR today in the ED. Of note, presentation does not appear to be  associated with acute hypoxic respiratory distress/failure, with patient maintaining O2 sats greater than 94% on room air. Additionally, CXR shows no evidence of acute process, including no evidence of infiltrate. Overall, it does not appear that criteria are met at the present time for patient's COVID-19 infection to be considered severe in nature. Consequently, there does not appear to be an indication at this time for initiation of systemic corticosteroids per treatment guidance recommendations from [facility]'s Covid  Treatment Guidelines. Will trend inflammatory markers, as outlined below.   Additionally, as anti-viral medications, including remdesivir, Paxlovid, and molnupiravir, have showed limited benefit in the treatment of COVID-19 infection, including limited benefit in reducing the probability for progression of the severity associated with this infection, will refrain from initiation of any of these antiviral medications at this time.   No known chronic underlying pulmonary pathology.  She does however have a history of type 2 diabetes mellitus.  Therefore, will initiate daily linagliptin as DPP-4 inhibitors have been shown to reduce mortality in patients with DM2 and a COVID-19.    Plan: Airborne and contact precautions. Monitor continuous pulse oximetry and monitor on telemetry. prn supplemental O2 to maintain O2 sats greater than or equal to 94%. Proning protocol initiated. PRN albuterol nebulizer. PRN acetaminophen for fever. Refraining from initiation of systemic corticosteroids and antivirals for now, as above. Check CRP now, with repeat level to be checked in the AM. Check serum magnesium and phosphorus levels. Check CMP and CBC in the morning. Flutter valve and incentive spirometry. check procalcitonin, which, if negative in the context of the pro inflammatory state associated with COVID-19, would provide a degree of negative predictive value that would further render the possibility of  bacterial pneumonia to be less likely.  Will discontinue additional antibiotics for now.  In setting of DM2, will start linagliptin 5 mg PO Qdaily for associated mortality benefit, as above.  As needed [employer].               #) Sepsis: Appears to be on the basis of COVID-19, as above, with SIRS criteria met via presenting objective fever, leukocytosis, tachycardia, tachypnea.  In the absence of associated evidence of endorgan damage, including a non-elevated initial LA, patient's sepsis does not meet criteria to be considered severe in nature at this time.  No evidence of associated hypotension thus far. In the absence of LA level greater than or equal to 4.0 and in the absence of hypotension, criteria are not met at this time for initiation of a 30 mL/kg IVF bolus. No evidence of additional underlying infectious process beyond COVID-19, and bacterial pneumonia is felt to be less likely at this time given DVT chest x-ray which showed no evidence of acute process, including no evidence of infiltrate.  Will check procalcitonin level to make further use of its high negative predictive value . as patient's sepsis is felt to be viral in nature, will refrain from additional antibiotic coverage at this time.  Of note, the patient received initial doses of Rocephin and azithromycin due to initial clinical concern for community-acquired pneumonia.  This occurred after collection of blood cultures x 2.  Plan: Work-up and management of COVID-19 infection, as above.  Follow for results of blood cultures x2.  Repeat CBC with differential in the morning.  Check urinalysis. PRN acetaminophen for fever. Check procalcitonin. Will refrain from additional antibiotics for now per above rationale.  Lactated Ringer's at 75 cc/h x 12 hours.                 #) Generalized weakness: 1 day of generalized weakness, in the absence of any evidence of acute focal neurologic deficits, including no  evidence of acute focal weakness to suggest acute CVA.  Suspect contribution from physiologic stress stemming from presenting sepsis due to COVID-19 infection, as above.  No e/o additional infectious process at this time, although urinalysis is currently pending.   Will further eval for any additional contributions  from endocrine/metabolic sources, as detailed below.   Plan: work-up and management of presenting sepsis due to COVID infection, as described above. PT/OT consults ordered for the AM. Fall precautions. CMP/CBC in the AM. Check TSH, serum Mg level. Check B12 level.  Follow for results of urinalysis.                  #) Type 2 Diabetes Mellitus: documented history of such. Home insulin regimen: None. Home oral hypoglycemic agents: Jardiance.  Additionally, she is on Trulicity as an outpatient.  Presenting blood sugar: 193. Most recent A1c noted to be 9.5% when checked in April 2024.  Complicated by diabetic peripheral polyneuropathy, for which she is on gabapentin at home.  Plan: accuchecks QAC and HS with low dose SSI. hold home oral hypoglycemic agent as well as Trulicity during this hospitalization.  Continue outpatient gabapentin.  Start Tradjenta in the setting of presenting COVID-19 infection, as above.                  #) Hyperlipidemia: documented h/o such. On rosuvastatin as outpatient.   Plan: continue home statin.                   #) Allergic Rhinitis: documented h/o such, on scheduled Allegra in the absence of any inhaled corticosteroids as an outpatient.    Plan: cont home Allegra.      DVT prophylaxis: SCD's   Code Status: Full code Family Communication: none Disposition Plan: Per Rounding Team Consults called: none;  Admission status: inpatient    I SPENT GREATER THAN 75  MINUTES IN CLINICAL CARE TIME/MEDICAL DECISION-MAKING IN COMPLETING THIS ADMISSION.     Chaney Born Ina Poupard DO [facility] From 7PM -  7AM   04/14/2023, 12:31 AM

## 2023-04-15 ENCOUNTER — Inpatient Hospital Stay (HOSPITAL_COMMUNITY): Payer: Medicare HMO

## 2023-04-15 ENCOUNTER — Other Ambulatory Visit (HOSPITAL_COMMUNITY): Payer: Self-pay

## 2023-04-15 DIAGNOSIS — U071 COVID-19: Secondary | ICD-10-CM | POA: Diagnosis not present

## 2023-04-15 LAB — COMPREHENSIVE METABOLIC PANEL
ALT: 16 U/L (ref 0–44)
AST: 14 U/L — ABNORMAL LOW (ref 15–41)
Albumin: 3.2 g/dL — ABNORMAL LOW (ref 3.5–5.0)
Alkaline Phosphatase: 49 U/L (ref 38–126)
Anion gap: 10 (ref 5–15)
BUN: 20 mg/dL (ref 8–23)
CO2: 23 mmol/L (ref 22–32)
Calcium: 8.3 mg/dL — ABNORMAL LOW (ref 8.9–10.3)
Chloride: 105 mmol/L (ref 98–111)
Creatinine, Ser: 0.79 mg/dL (ref 0.44–1.00)
GFR, Estimated: >60 mL/min (ref >60)
Glucose, Bld: 197 mg/dL — ABNORMAL HIGH (ref 70–99)
Potassium: 4.4 mmol/L (ref 3.5–5.1)
Sodium: 138 mmol/L (ref 135–145)
Total Bilirubin: 0.9 mg/dL (ref 0.3–1.2)
Total Protein: 6.7 g/dL (ref 6.5–8.1)

## 2023-04-15 LAB — SEDIMENTATION RATE: Sed Rate: 46 mm/hr — ABNORMAL HIGH (ref 0–22)

## 2023-04-15 LAB — CBC WITH DIFFERENTIAL/PLATELET
Abs Immature Granulocytes: 0.07 10*3/uL (ref 0.00–0.07)
Basophils Absolute: 0 10*3/uL (ref 0.0–0.1)
Basophils Relative: 0 %
Eosinophils Absolute: 0 10*3/uL (ref 0.0–0.5)
Eosinophils Relative: 0 %
HCT: 44.9 % (ref 36.0–46.0)
Hemoglobin: 14.1 g/dL (ref 12.0–15.0)
Immature Granulocytes: 1 %
Lymphocytes Relative: 10 %
Lymphs Abs: 1.2 10*3/uL (ref 0.7–4.0)
MCH: 30.7 pg (ref 26.0–34.0)
MCHC: 31.4 g/dL (ref 30.0–36.0)
MCV: 97.6 fL (ref 80.0–100.0)
Monocytes Absolute: 0.2 10*3/uL (ref 0.1–1.0)
Monocytes Relative: 2 %
Neutro Abs: 10.1 10*3/uL — ABNORMAL HIGH (ref 1.7–7.7)
Neutrophils Relative %: 87 %
Platelets: 300 10*3/uL (ref 150–400)
RBC: 4.6 MIL/uL (ref 3.87–5.11)
RDW: 13.5 % (ref 11.5–15.5)
WBC: 11.6 10*3/uL — ABNORMAL HIGH (ref 4.0–10.5)
nRBC: 0 % (ref 0.0–0.2)

## 2023-04-15 LAB — LACTATE DEHYDROGENASE: LDH: 100 U/L (ref 98–192)

## 2023-04-15 LAB — GLUCOSE, CAPILLARY
Glucose-Capillary: 183 mg/dL — ABNORMAL HIGH (ref 70–99)
Glucose-Capillary: 224 mg/dL — ABNORMAL HIGH (ref 70–99)

## 2023-04-15 LAB — C-REACTIVE PROTEIN: CRP: 9.7 mg/dL — ABNORMAL HIGH (ref <1.0)

## 2023-04-15 LAB — FIBRINOGEN: Fibrinogen: 705 mg/dL — ABNORMAL HIGH (ref 210–475)

## 2023-04-15 LAB — MAGNESIUM: Magnesium: 2.6 mg/dL — ABNORMAL HIGH (ref 1.7–2.4)

## 2023-04-15 LAB — PHOSPHORUS: Phosphorus: 2.9 mg/dL (ref 2.5–4.6)

## 2023-04-15 LAB — FERRITIN: Ferritin: 146 ng/mL (ref 11–307)

## 2023-04-15 LAB — D-DIMER, QUANTITATIVE: D-Dimer, Quant: 0.45 ug/mL-FEU (ref 0.00–0.50)

## 2023-04-15 MED ORDER — PREDNISONE 50 MG PO TABS
50.0000 mg | ORAL_TABLET | Freq: Every day | ORAL | 0 refills | Status: AC
  Filled 2023-04-15: qty 8, 8d supply, fill #0

## 2023-04-15 MED ORDER — NIRMATRELVIR/RITONAVIR (PAXLOVID)TABLET
3.0000 | ORAL_TABLET | Freq: Two times a day (BID) | ORAL | 0 refills | Status: AC
  Filled 2023-04-15: qty 30, 5d supply, fill #0

## 2023-04-15 NOTE — Progress Notes (Signed)
Transition of Care [facility]) - Inpatient Brief Assessment   Patient Details  Name: Barbara Golden MRN: [id number] Date of Birth: 07-Aug-1935  [facility] of [facility]) CM/SW Contact:    Larrie Kass, LCSW Phone Number: 04/15/2023, 10:43 AM   Clinical Narrative:    Transition of Care Asessment: Insurance and Status: Insurance coverage has been reviewed Patient has primary care physician: Yes Home environment has been reviewed: home with adult children Prior level of function:: mod indpendent Prior/Current Home Services: No current home services Social Determinants of Health Reivew: SDOH reviewed no interventions necessary Readmission risk has been reviewed: Yes Transition of care needs: no transition of care needs at this time

## 2023-04-15 NOTE — Plan of Care (Signed)
  Problem: Coping: Goal: Psychosocial and spiritual needs will be supported Outcome: Adequate for Discharge   Problem: Respiratory: Goal: Will maintain a patent airway Outcome: Adequate for Discharge Goal: Complications related to the disease process, condition or treatment will be avoided or minimized Outcome: Adequate for Discharge

## 2023-04-15 NOTE — Discharge Summary (Signed)
Physician Discharge Summary  Barbara Golden BJY:[id number] DOB: 05/21/1936 DOA: 04/13/2023  PCP: [place], Betty G, MD  Admit date: 04/13/2023 Discharge date: 04/15/2023 30 Day Unplanned Readmission Risk Score    Flowsheet Row ED to Hosp-Admission (Current) from 04/13/2023 in [facility] 4TH FLOOR PROGRESSIVE CARE AND UROLOGY  30 Day Unplanned Readmission Risk Score (%) 15.35 Filed at 04/15/2023 1200       This score is the patient's risk of an unplanned readmission within 30 days of being discharged (0 -100%). The score is based on dignosis, age, lab data, medications, orders, and past utilization.   Low:  0-14.9   Medium: 15-21.9   High: 22-29.9   Extreme: 30 and above          Admitted From: Home Disposition: Home  Recommendations for Outpatient Follow-up:  Follow up with PCP in 1-2 weeks Please obtain BMP/CBC in one week Please follow up with your PCP on the following pending results: Unresulted Labs (From admission, onward)    None         Home Health: None Equipment/Devices: None  Discharge Condition: Stable CODE STATUS: Full code Diet recommendation: Cardiac  Please refer to following HPI and ED course copied from admitting hospitalist H&P. HPI: SKILAH HERMIZ is a 87 y.o. female with medical history significant for type 2 diabetes mellitus, gated by diabetic peripheral polyneuropathy, hyperlipidemia, allergic rhinitis, who is admitted to [facility] on 04/13/2023 with COVID-19 infection after presenting from home to [facility] ED complaining of fever.    The patient reports 1 day of objective fever at home, during which time she has noted associated new onset nonproductive cough as well as generalized myalgias in the absence of chills or full body rigors.  Denies associated shortness of breath, headache, neck stiffness, rash, abdominal pain, nausea, vomiting, diarrhea.  She also denies any recent dysuria or gross hematuria.  Denies any known history of chronic  underlying pulmonary pathology.  Medical history notable for type 2 diabetes mellitus.  No recent chest pain, palpitations, diaphoresis, or hemoptysis.   She also notes generalized weakness over the last day, in the absence of any acute focal weakness.         ED Course:  Vital signs in the ED were notable for the following: Temperature max 101.1; heart rates in the 80s to 90s; systolic pressures in the 1 teens 140s; respiratory rate 16-23, oxygen saturation 97 to 98% on room air.   Labs were notable for the following: CMP notable for the following: Sodium 139, bicarbonate 27, creatinine 0.98, glucose 193, liver enzymes within normal limits.  Initial lactic acid 1.3.  CBC notable for white cell count 13,800 with 80% neutrophils.  Urinalysis ordered, Therazole currently pending.  Blood cultures x 2 were collected prior to initiation of IV antibiotics.  COVID-19 PCR positive.   Per my interpretation, EKG in ED demonstrated the following: Sinus rhythm with heart rate 96, normal intervals, nonspecific T wave flattening in inferior leads, otherwise no T wave changes nor any ST changes, including no evidence of ST elevation.   Imaging in the ED, per corresponding formal radiology read, was notable for the following: 2 view chest x-ray showed no evidence of acute cardiopulmonary process, including no evidence of infiltrate edema, effusion, or pneumothorax.   While in the ED, the following were administered: Acetaminophen 650 mg p.o. x 1 dose, prior to COVID-19 PCR positive result, the patient was received azithromycin, Rocephin; lactated Ringer's x 1 L bolus followed by initiation  continuous LR running at 150 cc/h.    Subjective: Seen and examined.  No complaints.  Denies shortness of breath.  She is in agreement with going home today.  Brief/Interim Summary: Patient was basically admitted secondary to COVID-19 infection.  She had temperature in the ED and Tmax was 101.1 at 8 PM on 04/13/2023.  She also  had leukocytosis.  Based on that, she met criteria for sepsis present on admission.  She was started on Paxlovid.  Although chest x-ray was negative and clear and she was not hypoxic but due to elevated inflammatory markers, she was started on Solu-Medrol.  Since then, patient has remained afebrile.  She has not required any oxygen.  She is feeling well with no respiratory symptoms.  Clinically she has improved and stable enough for discharge home today and she is in agreement with the plan.  We will discharge her on 8 more days of prednisone to complete 10-day course and 7 doses of Paxlovid to complete 10 doses.  Her weakness has improved.  She was seen by PT OT and no further needs were identified and no home health PT OT was recommended.  Resume rest of the home medications.  Discharge plan was discussed with patient and/or family member and they verbalized understanding and agreed with it.  Discharge Diagnoses:  Principal Problem:   COVID-19 Active Problems:   DM2 (diabetes mellitus, type 2) (HCC)   Hyperlipidemia   Sepsis (HCC)   Generalized weakness   Allergic rhinitis    Discharge Instructions   Allergies as of 04/15/2023       Reactions   Ammonia    Codeine Other (See Comments)   Causes lip burning and hyperactivity        Medication List     TAKE these medications    Allegra Allergy 60 MG tablet Generic drug: fexofenadine 1 po qd   famotidine 10 MG tablet Commonly known as: PEPCID Take 10 mg by mouth as needed for heartburn or indigestion.   FreeStyle Libre 14 Day Reader Hardie Pulley 1 Units by Does not apply route 2 (two) times daily. H35.30, E11.65   FreeStyle Libre 14 Day Sensor Misc Apply 1 sensor every 14 days E11.319   gabapentin 100 MG capsule Commonly known as: NEURONTIN TAKE 1 CAPSULE (100 MG TOTAL) BY MOUTH THREE TIMES DAILY.   Jardiance 25 MG Tabs tablet Generic drug: empagliflozin TAKE 1 TABLET BY MOUTH EVERY DAY BEFORE BREAKFAST   Medical  Compression Thigh High Misc 1 each by Does not apply route daily.   nirmatrelvir/ritonavir 20 x 150 MG & 10 x 100MG  Tabs Commonly known as: PAXLOVID Take 3 tablets by mouth 2 (two) times daily for 7 doses   predniSONE 50 MG tablet Commonly known as: DELTASONE Take 1 tablet (50 mg total) by mouth daily with breakfast for 8 days.   PreserVision AREDS Caps Take 1 capsule by mouth daily.   rosuvastatin 5 MG tablet Commonly known as: CRESTOR Take 5 mg by mouth daily.   Trulicity 3 MG/0.5ML Sopn Generic drug: Dulaglutide Inject 3 mg as directed once a week.   Trulicity 1.5 MG/0.5ML Sopn Generic drug: Dulaglutide Inject 1.5 mg into the skin every 7 (seven) days.        Follow-up Information     [place], Betty G, MD Follow up in 1 week(s).   Specialty: Family Medicine Contact information: [address] [place] [address] [phone]  Allergies  Allergen Reactions   Ammonia    Codeine Other (See Comments)    Causes lip burning and hyperactivity    Consultations: none   Procedures/Studies: DG CHEST PORT 1 VIEW  Result Date: 04/15/2023 CLINICAL DATA:  [id number] COVID [id number]. EXAM: PORTABLE CHEST 1 VIEW COMPARISON:  Chest radiograph 04/13/2023. FINDINGS: Low lung volumes accentuate the pulmonary vasculature and cardiomediastinal silhouette. No consolidation or pulmonary edema. No pleural effusion or pneumothorax. IMPRESSION: Low lung volumes without evidence of acute cardiopulmonary disease. Electronically Signed   By: Orvan Falconer M.D.   On: 04/15/2023 10:31   DG Chest 2 View  Result Date: 04/13/2023 CLINICAL DATA:  Fever. EXAM: CHEST - 2 VIEW COMPARISON:  July 16, 2021 FINDINGS: The heart size and mediastinal contours are within normal limits. There is stable, mild to moderate severity biapical pleural thickening. No acute infiltrate, pleural effusion or pneumothorax is identified. The visualized skeletal structures are  unremarkable. IMPRESSION: No active cardiopulmonary disease. Electronically Signed   By: Aram Candela M.D.   On: 04/13/2023 23:14     Discharge Exam: Vitals:   04/15/23 0332 04/15/23 1216  BP: 137/60 (!) 147/57  Pulse: 80 81  Resp: 19 20  Temp: 98 F (36.7 C) 97.7 F (36.5 C)  SpO2: 94% 98%   Vitals:   04/14/23 1941 04/15/23 0332 04/15/23 0558 04/15/23 1216  BP: (!) 149/60 137/60  (!) 147/57  Pulse: 88 80  81  Resp: 15 19  20   Temp: 98.1 F (36.7 C) 98 F (36.7 C)  97.7 F (36.5 C)  TempSrc: Oral Oral  Oral  SpO2: 93% 94%  98%  Weight:   74.4 kg   Height:        General: Pt is alert, awake, not in acute distress Cardiovascular: RRR, S1/S2 +, no rubs, no gallops Respiratory: CTA bilaterally, no wheezing, no rhonchi Abdominal: Soft, NT, ND, bowel sounds + Extremities: no edema, no cyanosis    The results of significant diagnostics from this hospitalization (including imaging, microbiology, ancillary and laboratory) are listed below for reference.     Microbiology: Recent Results (from the past 240 hour(s))  SARS Coronavirus 2 by RT PCR (hospital order, performed in [facility] hospital lab) *cepheid single result test* Anterior Nasal Swab     Status: Abnormal   Collection Time: 04/13/23  9:57 PM   Specimen: Anterior Nasal Swab  Result Value Ref Range Status   SARS Coronavirus 2 by RT PCR POSITIVE (A) NEGATIVE Final    Comment: (NOTE) SARS-CoV-2 target nucleic acids are DETECTED  SARS-CoV-2 RNA is generally detectable in upper respiratory specimens  during the acute phase of infection.  Positive results are indicative  of the presence of the identified virus, but do not rule out bacterial infection or co-infection with other pathogens not detected by the test.  Clinical correlation with patient history and  other diagnostic information is necessary to determine patient infection status.  The expected result is negative.  Fact Sheet for Patients:    RoadLapTop.co.za   Fact Sheet for Healthcare Providers:   http://kim-miller.com/    This test is not yet approved or cleared by the [place] FDA and  has been authorized for detection and/or diagnosis of SARS-CoV-2 by FDA under an Emergency Use Authorization (EUA).  This EUA will remain in effect (meaning this test can be used) for the duration of  the COVID-19 declaration under Section 564(b)(1)  of the Act, 21 U.S.C. section 360-bbb-3(b)(1), unless the authorization is terminated or  revoked sooner.   Performed at [facility], [address] [place], [place] [address]   Blood Culture (routine x 2)     Status: None (Preliminary result)   Collection Time: 04/13/23 10:44 PM   Specimen: BLOOD RIGHT FOREARM  Result Value Ref Range Status   Specimen Description   Final    BLOOD RIGHT FOREARM Performed at [facility], [address], [place], [place] [address]    Special Requests   Final    BOTTLES DRAWN AEROBIC AND ANAEROBIC Blood Culture adequate volume Performed at [facility], [address] [place], [place] [address]    Culture   Final    NO GROWTH 1 DAY Performed at [facility], [address], [place], [place] [address]    Report Status PENDING  Incomplete  Blood Culture (routine x 2)     Status: None (Preliminary result)   Collection Time: 04/13/23 10:44 PM   Specimen: BLOOD  Result Value Ref Range Status   Specimen Description   Final    BLOOD RIGHT ANTECUBITAL Performed at [facility], [address] [place], [place] [address]    Special Requests   Final    BOTTLES DRAWN AEROBIC AND ANAEROBIC Blood Culture adequate volume Performed at [facility], [address], [place], [place] [address]    Culture   Final    NO GROWTH 1 DAY Performed at [facility], [address], [place], [place] [address]    Report Status  PENDING  Incomplete     Labs: BNP (last 3 results) No results for input(s): "BNP" in the last 8760 hours. Basic Metabolic Panel: Recent Labs  Lab 04/13/23 2157 04/14/23 0433 04/15/23 0421  NA 139 140 138  K 3.5 3.8 4.4  CL 102 106 105  CO2 25 24 23   GLUCOSE 193* 141* 197*  BUN 14 14 20   CREATININE 0.98 0.87 0.79  CALCIUM 8.9 8.3* 8.3*  MG 2.3 2.2 2.6*  PHOS  --  3.2 2.9   Liver Function Tests: Recent Labs  Lab 04/13/23 2157 04/14/23 0433 04/15/23 0421  AST 16 13* 14*  ALT 14 13 16   ALKPHOS 54 44 49  BILITOT 1.1 0.9 0.9  PROT 7.5 6.6 6.7  ALBUMIN 3.7 3.3* 3.2*   No results for input(s): "LIPASE", "AMYLASE" in the last 168 hours. No results for input(s): "AMMONIA" in the last 168 hours. CBC: Recent Labs  Lab 04/13/23 2157 04/14/23 0433 04/15/23 0421  WBC 13.8* 12.4* 11.6*  NEUTROABS 10.9* 9.2* 10.1*  HGB 14.1 13.0 14.1  HCT 43.1 41.0 44.9  MCV 93.7 96.9 97.6  PLT 294 258 300   Cardiac Enzymes: No results for input(s): "CKTOTAL", "CKMB", "CKMBINDEX", "TROPONINI" in the last 168 hours. BNP: Invalid input(s): "POCBNP" CBG: Recent Labs  Lab 04/14/23 1158 04/14/23 1619 04/14/23 2128 04/15/23 0814 04/15/23 1208  GLUCAP 172* 225* 211* 183* 224*   D-Dimer Recent Labs    04/15/23 0421  DDIMER 0.45   Hgb A1c Recent Labs    04/13/23 2157  HGBA1C 8.7*   Lipid Profile No results for input(s): "CHOL", "HDL", "LDLCALC", "TRIG", "CHOLHDL", "LDLDIRECT" in the last 72 hours. Thyroid function studies Recent Labs    04/13/23 2157  TSH 0.566   Anemia work up Recent Labs    04/13/23 2157 04/15/23 0421  VITAMINB12 150*  --   FERRITIN  --  146   Urinalysis    Component Value  Date/Time   COLORURINE YELLOW 04/14/2023 0157   APPEARANCEUR CLEAR 04/14/2023 0157   LABSPEC 1.021 04/14/2023 0157   PHURINE 5.0 04/14/2023 0157   GLUCOSEU >=500 (A) 04/14/2023 0157   HGBUR NEGATIVE 04/14/2023 0157   BILIRUBINUR NEGATIVE 04/14/2023 0157   BILIRUBINUR  Negative 07/27/2020 1526   KETONESUR 5 (A) 04/14/2023 0157   PROTEINUR NEGATIVE 04/14/2023 0157   UROBILINOGEN negative (A) 07/27/2020 1526   UROBILINOGEN 0.2 03/27/2015 1641   NITRITE NEGATIVE 04/14/2023 0157   LEUKOCYTESUR TRACE (A) 04/14/2023 0157   Sepsis Labs Recent Labs  Lab 04/13/23 2157 04/14/23 0433 04/15/23 0421  WBC 13.8* 12.4* 11.6*   Microbiology Recent Results (from the past 240 hour(s))  SARS Coronavirus 2 by RT PCR (hospital order, performed in [facility] hospital lab) *cepheid single result test* Anterior Nasal Swab     Status: Abnormal   Collection Time: 04/13/23  9:57 PM   Specimen: Anterior Nasal Swab  Result Value Ref Range Status   SARS Coronavirus 2 by RT PCR POSITIVE (A) NEGATIVE Final    Comment: (NOTE) SARS-CoV-2 target nucleic acids are DETECTED  SARS-CoV-2 RNA is generally detectable in upper respiratory specimens  during the acute phase of infection.  Positive results are indicative  of the presence of the identified virus, but do not rule out bacterial infection or co-infection with other pathogens not detected by the test.  Clinical correlation with patient history and  other diagnostic information is necessary to determine patient infection status.  The expected result is negative.  Fact Sheet for Patients:   RoadLapTop.co.za   Fact Sheet for Healthcare Providers:   http://kim-miller.com/    This test is not yet approved or cleared by the [place] FDA and  has been authorized for detection and/or diagnosis of SARS-CoV-2 by FDA under an Emergency Use Authorization (EUA).  This EUA will remain in effect (meaning this test can be used) for the duration of  the COVID-19 declaration under Section 564(b)(1)  of the Act, 21 U.S.C. section 360-bbb-3(b)(1), unless the authorization is terminated or revoked sooner.   Performed at [facility], [address] [place],  [place] [address]   Blood Culture (routine x 2)     Status: None (Preliminary result)   Collection Time: 04/13/23 10:44 PM   Specimen: BLOOD RIGHT FOREARM  Result Value Ref Range Status   Specimen Description   Final    BLOOD RIGHT FOREARM Performed at [facility], [address], [place], [place] [address]    Special Requests   Final    BOTTLES DRAWN AEROBIC AND ANAEROBIC Blood Culture adequate volume Performed at [facility], [address], [place], [place] [address]    Culture   Final    NO GROWTH 1 DAY Performed at [facility], [address], [place], [place] [address]    Report Status PENDING  Incomplete  Blood Culture (routine x 2)     Status: None (Preliminary result)   Collection Time: 04/13/23 10:44 PM   Specimen: BLOOD  Result Value Ref Range Status   Specimen Description   Final    BLOOD RIGHT ANTECUBITAL Performed at [facility], [address] [place], [place] [address]    Special Requests   Final    BOTTLES DRAWN AEROBIC AND ANAEROBIC Blood Culture adequate volume Performed at [facility], [address], [place], [place] [address]    Culture   Final    NO GROWTH  1 DAY Performed at [facility], [address] [place], [place] [address]    Report Status PENDING  Incomplete    FURTHER DISCHARGE INSTRUCTIONS:   Get Medicines reviewed and adjusted: Please take all your medications with you for your next visit with your Primary MD   Laboratory/radiological data: Please request your Primary MD to go over all hospital tests and procedure/radiological results at the follow up, please ask your Primary MD to get all Hospital records sent to his/her office.   In some cases, they will be blood work, cultures and biopsy results pending at the time of your discharge. Please request that your primary care M.D. goes through all the records of your hospital data and follows up on these results.    Also Note the following: If you experience worsening of your admission symptoms, develop shortness of breath, life threatening emergency, suicidal or homicidal thoughts you must seek medical attention immediately by calling 911 or calling your MD immediately  if symptoms less severe.   You must read complete instructions/literature along with all the possible adverse reactions/side effects for all the Medicines you take and that have been prescribed to you. Take any new Medicines after you have completely understood and accpet all the possible adverse reactions/side effects.    Do not drive when taking Pain medications or sleeping medications (Benzodaizepines)   Do not take more than prescribed Pain, Sleep and Anxiety Medications. It is not advisable to combine anxiety,sleep and pain medications without talking with your primary care practitioner   Special Instructions: If you have smoked or chewed Tobacco  in the last 2 yrs please stop smoking, stop any regular Alcohol  and or any Recreational drug use.   Wear Seat belts while driving.   Please note: You were cared for by a hospitalist during your hospital stay. Once you are discharged, your primary care physician will handle any further medical issues. Please note that NO REFILLS for any discharge medications will be authorized once you are discharged, as it is imperative that you return to your primary care physician (or establish a relationship with a primary care physician if you do not have one) for your post hospital discharge needs so that they can reassess your need for medications and monitor your lab values  Time coordinating discharge: Over 30 minutes  SIGNED:   Hughie Closs, MD  [facility] 04/15/2023, 1:12 PM *Please note that this is a verbal dictation therefore any spelling or grammatical errors are due to the "Dragon Medical One" system interpretation. If 7PM-7AM, please contact night-coverage [email or web address]

## 2023-04-16 ENCOUNTER — Other Ambulatory Visit (HOSPITAL_COMMUNITY): Payer: Self-pay

## 2023-04-16 LAB — CULTURE, BLOOD (ROUTINE X 2)
Special Requests: ADEQUATE
Special Requests: ADEQUATE

## 2023-04-19 LAB — CULTURE, BLOOD (ROUTINE X 2)
Culture: NO GROWTH
Culture: NO GROWTH

## 2023-04-27 ENCOUNTER — Ambulatory Visit (INDEPENDENT_AMBULATORY_CARE_PROVIDER_SITE_OTHER): Payer: Medicare HMO | Admitting: Family Medicine

## 2023-04-27 ENCOUNTER — Encounter: Payer: Self-pay | Admitting: Family Medicine

## 2023-04-27 ENCOUNTER — Ambulatory Visit: Payer: Medicare HMO

## 2023-04-27 VITALS — BP 124/60 | HR 85 | Resp 16 | Ht 65.0 in | Wt 155.1 lb

## 2023-04-27 DIAGNOSIS — R0989 Other specified symptoms and signs involving the circulatory and respiratory systems: Secondary | ICD-10-CM

## 2023-04-27 DIAGNOSIS — U071 COVID-19: Secondary | ICD-10-CM | POA: Diagnosis not present

## 2023-04-27 DIAGNOSIS — I10 Essential (primary) hypertension: Secondary | ICD-10-CM | POA: Diagnosis not present

## 2023-04-27 DIAGNOSIS — Z7985 Long-term (current) use of injectable non-insulin antidiabetic drugs: Secondary | ICD-10-CM

## 2023-04-27 DIAGNOSIS — E782 Mixed hyperlipidemia: Secondary | ICD-10-CM

## 2023-04-27 DIAGNOSIS — Z8616 Personal history of COVID-19: Secondary | ICD-10-CM | POA: Diagnosis not present

## 2023-04-27 DIAGNOSIS — E1142 Type 2 diabetes mellitus with diabetic polyneuropathy: Secondary | ICD-10-CM

## 2023-04-27 DIAGNOSIS — Z7984 Long term (current) use of oral hypoglycemic drugs: Secondary | ICD-10-CM

## 2023-04-27 NOTE — Progress Notes (Signed)
Chief Complaint  Patient presents with   Hospitalization Follow-up   HPI: Ms.Arnett L Shiel is a 87 y.o. female with a PMHx significant for DM II, HTN, HLD, macular degeneration, back pain, varicose vein disease, endometrial cancer, and neuropathy, who is here today for hospital follow up on recent hospitalization.  She was hospitalized on 9/23 for covid 19 infection and went home 9/25.  She presented to [facility] ED c/o fever,productive cough,myalgias,and body rigors. During initial evaluation temp was 101.1 F, HR 80-90's, O2 sat at RA 97-98%, and RR 16-23/min. CXR showed no evidence of acute cardiopulmonary process, including no evidence of infiltrate edema, effusion, or pneumothorax.  Because of leukocytosis, she was empirically treated with abx: Azithromycin and Rocephin. Bcx x 2 no growth.  She was sent home with paxlovid and prednisone treatment. She has finished both.   She states she currently feels ok, but tires if she is up for too long.  She says she currently has "very little" cough.  She denies fever, CP, SOB,or wheezing.  She states her appetite has returned to baseline.  She notes she has had some vision changes since her covid 19 infection, had eye glasses rx changed a few months ago.  Chronic medications were not changed. HH arrangement has not been made, she is not interested in PT or OT evaluation. She is using a cane now and just got a walker to use around her house.  HTN: She is not currently on pharmacologic treatment.  She mentions she felt nauseated and faint when getting up from toilet and after a bowel movement on 10/2. Spinning sensation, she says she laid down on the floor for about 15 minutes until she felt she could sit up.  She says she has been drinking plenty of fluids and urinating frequently.  She denies any chest pain or palpitations during the episodes.   Diabetes Mellitus II: Dx'ed at age 41. - Checking BG at home: She is checking at home. She  states they are beginning to smooth down since she finished her course of prednisone on 10/3.  - Medications: She is currently taking Trulicity 1.5 mg per week and Jardiance 25 mg daily. She has taken metformin in the past but says she stopped because she was becoming "allergic" to it.She had constant diarrhea while taking the metformin.  -diet: She states she tries to eat a lot of vegetables, at least twice per day. She also says she eats fruit ~2x per day. She is also drinking 6 ounces of juice per day.   - eye exam: Her next eye exam was scheduled for 06/2023 but her doctor has retired. She expressed difficulty finding a doctor without having to drive a significant distance.  - Negative for symptoms of hypoglycemia, polyuria, polydipsia, foot ulcers/trauma  Peripheral neuropathy, burning feet sensation. She is on Gabapentin 100 mg tid. Lab Results  Component Value Date   HGBA1C 8.7 (H) 04/13/2023   Lab Results  Component Value Date   MICROALBUR 2.7 (H) 04/15/2022   Hyperlipidemia: Currently on rosuvastatin 5 mg daily.   Lab Results  Component Value Date   CHOL 118 01/11/2021   HDL 43 (L) 01/11/2021   LDLCALC 51 01/11/2021   TRIG 162 (H) 01/11/2021   CHOLHDL 2.7 01/11/2021   Review of Systems  Constitutional:  Positive for activity change and fatigue. Negative for chills.  HENT:  Negative for nosebleeds and sore throat.   Gastrointestinal:  Negative for abdominal pain, nausea and vomiting.  Endocrine: Negative  for cold intolerance and heat intolerance.  Genitourinary:  Negative for decreased urine volume, dysuria and hematuria.  Skin:  Negative for rash.  Neurological:  Negative for syncope and facial asymmetry.  Psychiatric/Behavioral:  Negative for confusion and hallucinations.   See other pertinent positives and negatives in HPI.  Current Outpatient Medications on File Prior to Visit  Medication Sig Dispense Refill   Continuous Blood Gluc Receiver (FREESTYLE LIBRE 14 DAY  READER) DEVI 1 Units by Does not apply route 2 (two) times daily. H35.30, E11.65 1 each 2   Continuous Blood Gluc Sensor (FREESTYLE LIBRE 14 DAY SENSOR) MISC Apply 1 sensor every 14 days E11.319 2 each 11   Dulaglutide (TRULICITY) 3 MG/0.5ML SOPN Inject 3 mg as directed once a week. 6 mL 2   Elastic Bandages & Supports (MEDICAL COMPRESSION THIGH HIGH) MISC 1 each by Does not apply route daily. 2 each 0   famotidine (PEPCID) 10 MG tablet Take 10 mg by mouth as needed for heartburn or indigestion.     fexofenadine (ALLEGRA ALLERGY) 60 MG tablet 1 po qd     gabapentin (NEURONTIN) 100 MG capsule TAKE 1 CAPSULE (100 MG TOTAL) BY MOUTH THREE TIMES DAILY. 90 capsule 3   JARDIANCE 25 MG TABS tablet TAKE 1 TABLET BY MOUTH EVERY DAY BEFORE BREAKFAST 30 tablet 3   Multiple Vitamins-Minerals (PRESERVISION AREDS) CAPS Take 1 capsule by mouth daily.     rosuvastatin (CRESTOR) 5 MG tablet Take 5 mg by mouth daily.     TRULICITY 1.5 MG/0.5ML SOPN Inject 1.5 mg into the skin every 7 (seven) days.     [DISCONTINUED] cetirizine (ZYRTEC) 10 MG tablet Take 10 mg by mouth daily as needed for allergies.     No current facility-administered medications on file prior to visit.   Past Medical History:  Diagnosis Date   Allergy    Cancer (HCC)    endometrial   Cataract    Diabetes mellitus without complication (HCC)    type 2   GERD (gastroesophageal reflux disease)    High cholesterol    Jaundice    as a child   Macular degeneration    Nuclear sclerotic cataract of both eyes 09/05/2020   Varicose veins of lower extremity    right leg   Allergies  Allergen Reactions   Ammonia    Codeine Other (See Comments)    Causes lip burning and hyperactivity    Social History   Socioeconomic History   Marital status: Widowed    Spouse name: Not on file   Number of children: 1   Years of education: Not on file   Highest education level: Not on file  Occupational History   Occupation: retired  Tobacco Use    Smoking status: Former    Current packs/day: 0.00    Types: Cigarettes    Quit date: 03/26/2009    Years since quitting: 14.0   Smokeless tobacco: Never  Vaping Use   Vaping status: Never Used  Substance and Sexual Activity   Alcohol use: No   Drug use: No   Sexual activity: Not Currently  Other Topics Concern   Not on file  Social History Narrative   3 Step Daughters/lives with Daughter(Pam Bouwer) and son-in-law.   Social Determinants of Health   Financial Resource Strain: Low Risk  (03/03/2023)   Overall Financial Resource Strain (CARDIA)    Difficulty of Paying Living Expenses: Not hard at all  Food Insecurity: No Food Insecurity (04/14/2023)   Hunger  Vital Sign    Worried About Programme researcher, broadcasting/film/video in the Last Year: Never true    Ran Out of Food in the Last Year: Never true  Transportation Needs: No Transportation Needs (04/14/2023)   PRAPARE - Administrator, Civil Service (Medical): No    Lack of Transportation (Non-Medical): No  Physical Activity: Insufficiently Active (03/03/2023)   Exercise Vital Sign    Days of Exercise per Week: 7 days    Minutes of Exercise per Session: 10 min  Stress: No Stress Concern Present (03/03/2023)   [employer] of Occupational Health - Occupational Stress Questionnaire    Feeling of Stress : Not at all  Social Connections: Not on file   Today's Vitals   04/27/23 1435  BP: 124/60  Pulse: 85  Resp: 16  SpO2: 95%  Weight: 155 lb 2 oz (70.4 kg)  Height: 5\' 5"  (1.651 m)   Body mass index is 25.81 kg/m.  Physical Exam Vitals and nursing note reviewed.  Constitutional:      General: She is not in acute distress.    Appearance: She is well-developed.  HENT:     Head: Normocephalic and atraumatic.     Mouth/Throat:     Mouth: Mucous membranes are moist.     Dentition: Has dentures.     Pharynx: Oropharynx is clear.  Eyes:     Conjunctiva/sclera: Conjunctivae normal.  Cardiovascular:     Rate and Rhythm: Normal  rate and regular rhythm.     Heart sounds: No murmur heard.    Comments: DP pulses palpable. Pulmonary:     Effort: Pulmonary effort is normal. No respiratory distress.     Breath sounds: Examination of the right-lower field reveals rales. Examination of the left-lower field reveals rales. Rales (Fine, R>L) present. No decreased breath sounds, wheezing or rhonchi.  Abdominal:     Palpations: Abdomen is soft. There is no mass.     Tenderness: There is no abdominal tenderness.  Musculoskeletal:     Right lower leg: No edema.     Left lower leg: No edema.  Skin:    General: Skin is warm.     Findings: No erythema or rash.  Neurological:     General: No focal deficit present.     Mental Status: She is alert and oriented to person, place, and time.     Cranial Nerves: No cranial nerve deficit.     Comments: Unstable gait assisted with a cane.  Psychiatric:        Mood and Affect: Mood and affect normal.   ASSESSMENT AND PLAN:  Ms. Palazzo was seen today for hospital follow up on recent hospitalization.  Orders Placed This Encounter  Procedures   DG Chest 2 View   CBC   Basic metabolic panel   Microalbumin / creatinine urine ratio   Lab Results  Component Value Date   WBC 15.9 (H) 04/27/2023   HGB 14.0 04/27/2023   HCT 44.2 04/27/2023   MCV 93.9 04/27/2023   PLT 319.0 04/27/2023   Lab Results  Component Value Date   NA 144 04/27/2023   CL 102 04/27/2023   K 4.3 04/27/2023   CO2 25 04/27/2023   BUN 19 04/27/2023   CREATININE 1.08 04/27/2023   GFR 46.97 (L) 04/27/2023   CALCIUM 9.2 04/27/2023   PHOS 2.9 04/15/2023   ALBUMIN 3.2 (L) 04/15/2023   GLUCOSE 222 (H) 04/27/2023   Lab Results  Component Value Date   MICROALBUR  1.3 04/27/2023   MICROALBUR 2.7 (H) 04/15/2022   Type 2 diabetes mellitus with diabetic polyneuropathy, without long-term current use of insulin (HCC) Assessment & Plan: HgA1C is not at goal but improved. For now no changes in Trulicity or  Jardiance dose. She agrees with stopping juice intake. Continue monitoring BS's regularly.  Orders: -     Microalbumin / creatinine urine ratio; Future  Essential hypertension Assessment & Plan: Continue non pharmacologic treatment. Monitor BP regularly. Eye exam is current.  Orders: -     CBC; Future -     Basic metabolic panel; Future  Bilateral rales Clinically she is gradually improving. Completed abx and antiviral treatment. ? Atelectasis. Recommend incentive spirometry, she has device at home. CXR ordered today.  -     CBC; Future -     DG Chest 2 View; Future  Mixed hyperlipidemia Assessment & Plan: LDL 51 in 12/2020. Continue Rosuvastatin 5 mg daily and low fat diet. She is not fasting today.  COVID-19 Most symptoms have resolved, not longer having fever. Residual cough. She is reporting vision changes since acute infection, not back to her baseline, recommend arranging appt with new eye care provider.  I spent a total of 40 minutes in both face to face and non face to face activities for this visit on the date of this encounter. During this time history was obtained and documented, examination was performed, prior labs/imaging reviewed, and assessment/plan discussed.  Return in about 3 months (around 07/28/2023) for chronic problems.  I, Suanne Marker, acting as a scribe for Ferguson Gertner [place], MD., have documented all relevant documentation on the behalf of Deasha Clendenin [place], MD, as directed by  Katlen Seyer [place], MD while in the presence of Norvel Wenker [place], MD.   I, Suanne Marker, have reviewed all documentation for this visit. The documentation on 04/27/23 for the exam, diagnosis, procedures, and orders are all accurate and complete.  Talasia Saulter G. [place], MD  [facility]. [place] office.

## 2023-04-27 NOTE — Patient Instructions (Addendum)
A few things to remember from today's visit:  Type 2 diabetes mellitus with diabetic polyneuropathy, without long-term current use of insulin (HCC) - Plan: Microalbumin / creatinine urine ratio  Essential hypertension - Plan: CBC, Basic metabolic panel  Bilateral rales - Plan: CBC, DG Chest 2 View  No changes today. Continue adequate hydration. Stop juice intake. Continue monitoring blood pressure.  If you need refills for medications you take chronically, please call your pharmacy. Do not use My Chart to request refills or for acute issues that need immediate attention. If you send a my chart message, it may take a few days to be addressed, specially if I am not in the office.  Please be sure medication list is accurate. If a new problem present, please set up appointment sooner than planned today.

## 2023-04-28 LAB — CBC
HCT: 44.2 % (ref 36.0–46.0)
Hemoglobin: 14 g/dL (ref 12.0–15.0)
MCHC: 31.7 g/dL (ref 30.0–36.0)
MCV: 93.9 fL (ref 78.0–100.0)
Platelets: 319 10*3/uL (ref 150.0–400.0)
RBC: 4.71 Mil/uL (ref 3.87–5.11)
RDW: 13.3 % (ref 11.5–15.5)
WBC: 15.9 10*3/uL — ABNORMAL HIGH (ref 4.0–10.5)

## 2023-04-28 LAB — MICROALBUMIN / CREATININE URINE RATIO
Creatinine,U: 50.4 mg/dL
Microalb Creat Ratio: 2.7 mg/g (ref 0.0–30.0)
Microalb, Ur: 1.3 mg/dL (ref 0.0–1.9)

## 2023-04-28 LAB — BASIC METABOLIC PANEL
BUN: 19 mg/dL (ref 6–23)
CO2: 25 meq/L (ref 19–32)
Calcium: 9.2 mg/dL (ref 8.4–10.5)
Chloride: 102 meq/L (ref 96–112)
Creatinine, Ser: 1.08 mg/dL (ref 0.40–1.20)
GFR: 46.97 mL/min — ABNORMAL LOW (ref >60.00)
Glucose, Bld: 222 mg/dL — ABNORMAL HIGH (ref 70–99)
Potassium: 4.3 meq/L (ref 3.5–5.1)
Sodium: 144 meq/L (ref 135–145)

## 2023-04-28 NOTE — Assessment & Plan Note (Signed)
HgA1C is not at goal but improved. For now no changes in Trulicity or Jardiance dose. She agrees with stopping juice intake. Continue monitoring BS's regularly.

## 2023-04-28 NOTE — Assessment & Plan Note (Signed)
Continue non pharmacologic treatment. Monitor BP regularly. Eye exam is current.

## 2023-04-28 NOTE — Assessment & Plan Note (Signed)
LDL 51 in 12/2020. Continue Rosuvastatin 5 mg daily and low fat diet. She is not fasting today.

## 2023-05-12 ENCOUNTER — Other Ambulatory Visit: Payer: Self-pay | Admitting: Family Medicine

## 2023-06-06 ENCOUNTER — Other Ambulatory Visit: Payer: Self-pay

## 2023-06-06 ENCOUNTER — Encounter: Payer: Self-pay | Admitting: *Deleted

## 2023-06-06 ENCOUNTER — Ambulatory Visit
Admission: EM | Admit: 2023-06-06 | Discharge: 2023-06-06 | Disposition: A | Discharge status: Home / Self Care | Payer: Medicare HMO | Attending: Internal Medicine | Admitting: Internal Medicine

## 2023-06-06 DIAGNOSIS — J069 Acute upper respiratory infection, unspecified: Secondary | ICD-10-CM | POA: Diagnosis not present

## 2023-06-06 LAB — POCT INFLUENZA A/B
Influenza A, POC: NEGATIVE
Influenza B, POC: NEGATIVE

## 2023-06-06 MED ORDER — BENZONATATE 100 MG PO CAPS: 100.0000 mg | ORAL_CAPSULE | Freq: Three times a day (TID) | ORAL | 0 refills | Status: DC | PRN

## 2023-06-06 NOTE — ED Triage Notes (Signed)
Pt reports "deep cough" non-productive with runny nose and sore throat since yesterday. She has been around her great-grandson who had same sx

## 2023-06-06 NOTE — ED Provider Notes (Signed)
[facility]    CSN: [id number] Arrival date & time: 06/06/23  1439      History   Chief Complaint Chief Complaint  Patient presents with   Cough    HPI Barbara Golden is a 87 y.o. female.   Patient presents with nasal congestion and cough that started yesterday. Barbara Golden has similar symptoms. Denies fever but reports chills. Has taken mucinex for symptoms. Denies chest pain or shortness of breath. Denies history of asthma or COPD.    Cough   Past Medical History:  Diagnosis Date   Allergy    Cancer (HCC)    endometrial   Cataract    Diabetes mellitus without complication (HCC)    type 2   GERD (gastroesophageal reflux disease)    High cholesterol    Jaundice    as a child   Macular degeneration    Nuclear sclerotic cataract of both eyes 09/05/2020   Varicose veins of lower extremity    right leg    Patient Active Problem List   Diagnosis Date Noted   COVID-19 04/14/2023   Sepsis (HCC) 04/14/2023   Generalized weakness 04/14/2023   Allergic rhinitis 04/14/2023   Hyperlipidemia 04/15/2022   Pseudophakia, both eyes 03/21/2021   Endometrial cancer (HCC) 09/11/2020   Posterior vitreous detachment of both eyes 09/05/2020   Advanced nonexudative age-related macular degeneration of right eye with subfoveal involvement 09/05/2020   Advanced nonexudative age-related macular degeneration of left eye without subfoveal involvement 09/05/2020   Overgrown toenails 12/15/2019   Lumbar paraspinal muscle spasm 10/06/2019   Stress incontinence 10/06/2019   Type 2 diabetes mellitus with diabetic neuropathy, unspecified (HCC) 10/06/2019   DM2 (diabetes mellitus, type 2) (HCC) 09/21/2019   Essential hypertension 09/21/2019   Asymptomatic bacteriuria 09/21/2019    Past Surgical History:  Procedure Laterality Date   ROBOTIC ASSISTED TOTAL HYSTERECTOMY WITH BILATERAL SALPINGO OOPHERECTOMY Bilateral 09/11/2020   Procedure: XI ROBOTIC ASSISTED TOTAL HYSTERECTOMY  WITH BILATERAL SALPINGO OOPHORECTOMY;  Surgeon: Adolphus Birchwood, MD;  Location: [facility];  Service: Gynecology;  Laterality: Bilateral;   SENTINEL NODE BIOPSY N/A 09/11/2020   Procedure: SENTINEL NODE BIOPSY;  Surgeon: Adolphus Birchwood, MD;  Location: [facility];  Service: Gynecology;  Laterality: N/A;   TUBAL LIGATION Bilateral     OB History     Gravida  1   Para  1   Term      Preterm      AB      Living  1      SAB      IAB      Ectopic      Multiple      Live Births               Home Medications    Prior to Admission medications   Medication Sig Start Date End Date Taking? Authorizing Provider  benzonatate (TESSALON) 100 MG capsule Take 1 capsule (100 mg total) by mouth every 8 (eight) hours as needed for cough. 06/06/23  Yes Lazara Grieser, Rolly Salter E, FNP  Dulaglutide (TRULICITY) 1.5 MG/0.5ML SOAJ INJECT 1.5 MG INTO THE SKIN ONCE A WEEK. 05/12/23  Yes [place], Betty G, MD  famotidine (PEPCID) 10 MG tablet Take 10 mg by mouth as needed for heartburn or indigestion.   Yes [provider]  fexofenadine (ALLEGRA ALLERGY) 60 MG tablet 1 po qd   Yes [provider]  gabapentin (NEURONTIN) 100 MG capsule TAKE 1 CAPSULE (100 MG TOTAL) BY MOUTH THREE TIMES DAILY.  12/09/22  Yes [place], Betty G, MD  JARDIANCE 25 MG TABS tablet TAKE 1 TABLET BY MOUTH EVERY DAY BEFORE BREAKFAST 02/16/23  Yes [place], Betty G, MD  Multiple Vitamins-Minerals (PRESERVISION AREDS) CAPS Take 1 capsule by mouth daily. 08/21/20  Yes [provider]  rosuvastatin (CRESTOR) 5 MG tablet Take 5 mg by mouth daily. 08/17/20  Yes [provider]  Continuous Blood Gluc Receiver (FREESTYLE LIBRE 14 DAY READER) DEVI 1 Units by Does not apply route 2 (two) times daily. H35.30, E11.65 10/07/19   Reed, Tiffany L, DO  Continuous Blood Gluc Sensor (FREESTYLE LIBRE 14 DAY SENSOR) MISC Apply 1 sensor every 14 days E11.319 06/18/22   [place], Betty G, MD  Elastic Bandages & Supports (MEDICAL COMPRESSION THIGH  HIGH) MISC 1 each by Does not apply route daily. 04/12/20   Wieters, Hallie C, PA-C  cetirizine (ZYRTEC) 10 MG tablet Take 10 mg by mouth daily as needed for allergies.  09/13/19  [provider]    Family History Family History  Problem Relation Age of Onset   Colon cancer Mother    Cancer Mother        colon/stomach   Heart disease Father    Heart attack Sister    Cancer Nephew        bile duct    Social History Social History   Tobacco Use   Smoking status: Former    Current packs/day: 0.00    Types: Cigarettes    Quit date: 03/26/2009    Years since quitting: 14.2   Smokeless tobacco: Never  Vaping Use   Vaping status: Never Used  Substance Use Topics   Alcohol use: No   Drug use: No     Allergies   Ammonia and Codeine   Review of Systems Review of Systems Per HPI  Physical Exam Triage Vital Signs ED Triage Vitals  Encounter Vitals Group     BP 06/06/23 1548 133/70     Systolic BP Percentile --      Diastolic BP Percentile --      Pulse Rate 06/06/23 1548 96     Resp 06/06/23 1548 (!) 24     Temp 06/06/23 1548 99.6 F (37.6 C)     Temp Source 06/06/23 1548 Oral     SpO2 06/06/23 1548 98 %     Weight --      Height --      Head Circumference --      Peak Flow --      Pain Score 06/06/23 1545 0     Pain Loc --      Pain Education --      Exclude from Growth Chart --    No data found.  Updated Vital Signs BP 133/70 (BP Location: Left Wrist)   Pulse 96   Temp 99.6 F (37.6 C) (Oral)   Resp (!) 24   LMP 07/21/1998 (Approximate)   SpO2 98%   Visual Acuity Right Eye Distance:   Left Eye Distance:   Bilateral Distance:    Right Eye Near:   Left Eye Near:    Bilateral Near:     Physical Exam Constitutional:      General: She is not in acute distress.    Appearance: Normal appearance. She is not toxic-appearing or diaphoretic.  HENT:     Head: Normocephalic and atraumatic.     Right Ear: Tympanic membrane and ear canal normal.      Left Ear: Tympanic membrane and  ear canal normal.     Nose: Congestion present.     Mouth/Throat:     Mouth: Mucous membranes are moist.     Pharynx: No posterior oropharyngeal erythema.  Eyes:     Extraocular Movements: Extraocular movements intact.     Conjunctiva/sclera: Conjunctivae normal.     Pupils: Pupils are equal, round, and reactive to light.  Cardiovascular:     Rate and Rhythm: Normal rate and regular rhythm.     Pulses: Normal pulses.     Heart sounds: Normal heart sounds.  Pulmonary:     Effort: Pulmonary effort is normal. No respiratory distress.     Breath sounds: Normal breath sounds. No stridor. No wheezing, rhonchi or rales.  Abdominal:     General: Abdomen is flat. Bowel sounds are normal.     Palpations: Abdomen is soft.  Musculoskeletal:        General: Normal range of motion.     Cervical back: Normal range of motion.  Skin:    General: Skin is warm and dry.  Neurological:     General: No focal deficit present.     Mental Status: She is alert and oriented to person, place, and time. Mental status is at baseline.  Psychiatric:        Mood and Affect: Mood normal.        Behavior: Behavior normal.      UC Treatments / Results  Labs (all labs ordered are listed, but only abnormal results are displayed) Labs Reviewed  POCT INFLUENZA A/B    EKG   Radiology No results found.  Procedures Procedures (including critical care time)  Medications Ordered in UC Medications - No data to display  Initial Impression / Assessment and Plan / UC Course  I have reviewed the triage vital signs and the nursing notes.  Pertinent labs & imaging results that were available during my care of the patient were reviewed by me and considered in my medical decision making (see chart for details).     Patient presents with symptoms likely from a viral upper respiratory infection. Do not suspect underlying cardiopulmonary process. Symptoms seem unlikely related  to ACS, CHF or COPD exacerbations, pneumonia, pneumothorax. Patient is nontoxic appearing and not in need of emergent medical intervention. Rapid flu was negative. Covid testing deferred given patient had covid less than 2 months ago and I have a low suspicion for this.   Recommended symptom control with medications, supportive care, symptom management.  Return if symptoms fail to improve in 1-2 weeks or you develop shortness of breath, chest pain, severe headache. Patient states understanding and is agreeable.  Discharged with PCP followup.  Final Clinical Impressions(s) / UC Diagnoses   Final diagnoses:  Viral upper respiratory tract infection with cough     Discharge Instructions      Suspect viral cause to your symptoms so this should run its course. I have prescribed a cough medication.  Follow up if any symptoms persist or worsen.      ED Prescriptions     Medication Sig Dispense Auth. Provider   benzonatate (TESSALON) 100 MG capsule Take 1 capsule (100 mg total) by mouth every 8 (eight) hours as needed for cough. 21 capsule [place], Acie Fredrickson, [place]      PDMP not reviewed this encounter.   Gustavus Bryant, [place] 06/06/23 1620

## 2023-06-06 NOTE — Discharge Instructions (Signed)
Suspect viral cause to your symptoms so this should run its course. I have prescribed a cough medication.  Follow up if any symptoms persist or worsen.

## 2023-06-07 ENCOUNTER — Emergency Department (HOSPITAL_BASED_OUTPATIENT_CLINIC_OR_DEPARTMENT_OTHER)
Admission: EM | Admit: 2023-06-07 | Discharge: 2023-06-07 | Disposition: A | Discharge status: Home / Self Care | Payer: Medicare HMO | Attending: Emergency Medicine | Admitting: Emergency Medicine

## 2023-06-07 ENCOUNTER — Emergency Department (HOSPITAL_BASED_OUTPATIENT_CLINIC_OR_DEPARTMENT_OTHER): Payer: Medicare HMO

## 2023-06-07 ENCOUNTER — Encounter (HOSPITAL_BASED_OUTPATIENT_CLINIC_OR_DEPARTMENT_OTHER): Payer: Self-pay

## 2023-06-07 DIAGNOSIS — J189 Pneumonia, unspecified organism: Secondary | ICD-10-CM | POA: Insufficient documentation

## 2023-06-07 DIAGNOSIS — R058 Other specified cough: Secondary | ICD-10-CM | POA: Diagnosis not present

## 2023-06-07 DIAGNOSIS — R5383 Other fatigue: Secondary | ICD-10-CM | POA: Diagnosis not present

## 2023-06-07 DIAGNOSIS — R0602 Shortness of breath: Secondary | ICD-10-CM | POA: Diagnosis not present

## 2023-06-07 DIAGNOSIS — E119 Type 2 diabetes mellitus without complications: Secondary | ICD-10-CM | POA: Insufficient documentation

## 2023-06-07 DIAGNOSIS — Z859 Personal history of malignant neoplasm, unspecified: Secondary | ICD-10-CM | POA: Diagnosis not present

## 2023-06-07 DIAGNOSIS — J449 Chronic obstructive pulmonary disease, unspecified: Secondary | ICD-10-CM | POA: Diagnosis not present

## 2023-06-07 DIAGNOSIS — Z794 Long term (current) use of insulin: Secondary | ICD-10-CM | POA: Diagnosis not present

## 2023-06-07 LAB — CBC
HCT: 46.2 % — ABNORMAL HIGH (ref 36.0–46.0)
Hemoglobin: 14.6 g/dL (ref 12.0–15.0)
MCH: 29.4 pg (ref 26.0–34.0)
MCHC: 31.6 g/dL (ref 30.0–36.0)
MCV: 93.1 fL (ref 80.0–100.0)
Platelets: 337 K/uL (ref 150–400)
RBC: 4.96 MIL/uL (ref 3.87–5.11)
RDW: 13.7 % (ref 11.5–15.5)
WBC: 11.4 K/uL — ABNORMAL HIGH (ref 4.0–10.5)
nRBC: 0 % (ref 0.0–0.2)

## 2023-06-07 LAB — BASIC METABOLIC PANEL WITH GFR
Anion gap: 10 (ref 5–15)
BUN: 14 mg/dL (ref 8–23)
CO2: 27 mmol/L (ref 22–32)
Calcium: 9.1 mg/dL (ref 8.9–10.3)
Chloride: 100 mmol/L (ref 98–111)
Creatinine, Ser: 0.85 mg/dL (ref 0.44–1.00)
GFR, Estimated: >60 mL/min
Glucose, Bld: 182 mg/dL — ABNORMAL HIGH (ref 70–99)
Potassium: 3.8 mmol/L (ref 3.5–5.1)
Sodium: 137 mmol/L (ref 135–145)

## 2023-06-07 MED ORDER — ALBUTEROL SULFATE HFA 108 (90 BASE) MCG/ACT IN AERS
2.0000 | INHALATION_SPRAY | RESPIRATORY_TRACT | Status: DC | PRN
  Administered 2023-06-07: 2 via RESPIRATORY_TRACT
  Filled 2023-06-07: qty 6.7

## 2023-06-07 MED ORDER — SODIUM CHLORIDE 0.9 % IV SOLN
1.0000 g | Freq: Once | INTRAVENOUS | Status: AC
  Administered 2023-06-07: 1 g via INTRAVENOUS
  Filled 2023-06-07: qty 10

## 2023-06-07 MED ORDER — AMOXICILLIN-POT CLAVULANATE 875-125 MG PO TABS: 1.0000 | ORAL_TABLET | Freq: Two times a day (BID) | ORAL | 0 refills | Status: DC

## 2023-06-07 MED ORDER — DOXYCYCLINE HYCLATE 100 MG PO TABS
100.0000 mg | ORAL_TABLET | Freq: Once | ORAL | Status: AC
  Administered 2023-06-07: 100 mg via ORAL
  Filled 2023-06-07: qty 1

## 2023-06-07 MED ORDER — IPRATROPIUM-ALBUTEROL 0.5-2.5 (3) MG/3ML IN SOLN
3.0000 mL | Freq: Once | RESPIRATORY_TRACT | Status: AC
  Administered 2023-06-07: 3 mL via RESPIRATORY_TRACT
  Filled 2023-06-07: qty 3

## 2023-06-07 MED ORDER — DOXYCYCLINE HYCLATE 100 MG PO CAPS: 100.0000 mg | ORAL_CAPSULE | Freq: Two times a day (BID) | ORAL | 0 refills | Status: DC

## 2023-06-07 NOTE — Discharge Instructions (Signed)
2 puffs of your albuterol inhaler every 4 hours for the next 4 to 5 days or as needed for severe coughing. Contact a health care provider if: You have a fever. You have trouble sleeping because you cannot control your cough with cough medicine. Get help right away if: Your shortness of breath becomes worse. Your chest pain increases. Your sickness becomes worse, especially if you are an older adult or have a weak immune system. You cough up blood. These symptoms may be an emergency. Get help right away. Call 911. Do not wait to see if the symptoms will go away. Do not drive yourself to the hospital.

## 2023-06-07 NOTE — ED Notes (Signed)
RT ambulated the Pt on RA. SATS 95-99% and HR 97-101

## 2023-06-07 NOTE — ED Provider Notes (Signed)
EMERGENCY DEPARTMENT AT [facility] Provider Note   CSN: [id number] Arrival date & time: 06/07/23  1323     History {Add pertinent medical, surgical, social history, OB history to HPI:1} Chief Complaint  Patient presents with   Cough   Shortness of Breath    Barbara Golden is a 87 y.o. female who presents emergency department with a chief complaint of productive cough.  She has been feeling unwell for the past few days with cough.  She has chest wall pain from coughing so hard.  She was seen as an at an urgent care yesterday and discharged on Tessalon.  Patient states that she did not get a chest x-ray and feels like her symptoms have worsened.  She feels very winded at rest it is worse when she exerts herself.  She had a recent COVID-19 infection that required hospitalization about a month and a half ago.  She denies hemoptysis.  She has cough productive of green sputum, facial pressure and nasal congestion.  She denies chest pain or loss of consciousness.   Cough Associated symptoms: shortness of breath   Shortness of Breath Associated symptoms: cough        Home Medications Prior to Admission medications   Medication Sig Start Date End Date Taking? Authorizing Provider  benzonatate (TESSALON) 100 MG capsule Take 1 capsule (100 mg total) by mouth every 8 (eight) hours as needed for cough. 06/06/23   Gustavus Bryant, FNP  Continuous Blood Gluc Receiver (FREESTYLE LIBRE 14 DAY READER) DEVI 1 Units by Does not apply route 2 (two) times daily. H35.30, E11.65 10/07/19   Reed, Tiffany L, DO  Continuous Blood Gluc Sensor (FREESTYLE LIBRE 14 DAY SENSOR) MISC Apply 1 sensor every 14 days E11.319 06/18/22   [place], Betty G, MD  Dulaglutide (TRULICITY) 1.5 MG/0.5ML SOAJ INJECT 1.5 MG INTO THE SKIN ONCE A WEEK. 05/12/23   [place], Betty G, MD  Elastic Bandages & Supports (MEDICAL COMPRESSION THIGH HIGH) MISC 1 each by Does not apply route daily. 04/12/20   Wieters, Hallie C,  PA-C  famotidine (PEPCID) 10 MG tablet Take 10 mg by mouth as needed for heartburn or indigestion.    [provider]  fexofenadine (ALLEGRA ALLERGY) 60 MG tablet 1 po qd    [provider]  gabapentin (NEURONTIN) 100 MG capsule TAKE 1 CAPSULE (100 MG TOTAL) BY MOUTH THREE TIMES DAILY. 12/09/22   [place], Betty G, MD  JARDIANCE 25 MG TABS tablet TAKE 1 TABLET BY MOUTH EVERY DAY BEFORE BREAKFAST 02/16/23   [place], Betty G, MD  Multiple Vitamins-Minerals (PRESERVISION AREDS) CAPS Take 1 capsule by mouth daily. 08/21/20   [provider]  rosuvastatin (CRESTOR) 5 MG tablet Take 5 mg by mouth daily. 08/17/20   [provider]  cetirizine (ZYRTEC) 10 MG tablet Take 10 mg by mouth daily as needed for allergies.  09/13/19  [provider]      Allergies    Ammonia and Codeine    Review of Systems   Review of Systems  Respiratory:  Positive for cough and shortness of breath.     Physical Exam Updated Vital Signs BP 139/78 (BP Location: Right Arm)   Pulse 97   Temp 98 F (36.7 C) (Oral)   Resp 16   Ht 5\' 5"  (1.651 m)   Wt 72.6 kg   LMP 07/21/1998 (Approximate)   SpO2 95%   BMI 26.63 kg/m  Physical Exam Vitals and nursing note reviewed.  Constitutional:  General: She is not in acute distress.    Appearance: She is well-developed. She is ill-appearing. She is not diaphoretic.  HENT:     Head: Normocephalic and atraumatic.     Right Ear: External ear normal.     Left Ear: External ear normal.     Nose: Nose normal.     Mouth/Throat:     Mouth: Mucous membranes are moist.  Eyes:     General: No scleral icterus.    Conjunctiva/sclera: Conjunctivae normal.  Cardiovascular:     Rate and Rhythm: Normal rate and regular rhythm.     Heart sounds: Normal heart sounds. No murmur heard.    No friction rub. No gallop.  Pulmonary:     Effort: Pulmonary effort is normal. No respiratory distress.     Breath sounds: Examination of the right-upper  field reveals rhonchi. Examination of the left-upper field reveals rhonchi. Examination of the right-middle field reveals rhonchi. Examination of the left-middle field reveals rhonchi. Examination of the right-lower field reveals rhonchi. Examination of the left-lower field reveals rhonchi. Rhonchi present. No decreased breath sounds.     Comments: Patient is winded with increased work of breathing Abdominal:     General: Bowel sounds are normal. There is no distension.     Palpations: Abdomen is soft. There is no mass.     Tenderness: There is no abdominal tenderness. There is no guarding.  Musculoskeletal:     Cervical back: Normal range of motion.  Skin:    General: Skin is warm and dry.  Neurological:     Mental Status: She is alert and oriented to person, place, and time.  Psychiatric:        Behavior: Behavior normal.     ED Results / Procedures / Treatments   Labs (all labs ordered are listed, but only abnormal results are displayed) Labs Reviewed  BASIC METABOLIC PANEL  CBC    EKG None  Radiology DG Chest Port 1 View  Result Date: 06/07/2023 CLINICAL DATA:  Productive cough, shortness of breath, and fatigue for 4 days. EXAM: PORTABLE CHEST 1 VIEW COMPARISON:  04/27/2023 FINDINGS: The heart size and mediastinal contours are within normal limits. Pulmonary hyperinflation again seen, consistent with COPD. Both lungs are clear. The visualized skeletal structures are unremarkable. IMPRESSION: No active disease. COPD. Electronically Signed   By: Danae Orleans M.D.   On: 06/07/2023 14:25    Procedures Procedures  {Document cardiac monitor, telemetry assessment procedure when appropriate:1}  Medications Ordered in ED Medications  albuterol (VENTOLIN HFA) 108 (90 Base) MCG/ACT inhaler 2 puff (has no administration in time range)  cefTRIAXone (ROCEPHIN) 1 g in sodium chloride 0.9 % 100 mL IVPB (has no administration in time range)  doxycycline (VIBRA-TABS) tablet 100 mg (has no  administration in time range)  ipratropium-albuterol (DUONEB) 0.5-2.5 (3) MG/3ML nebulizer solution 3 mL (has no administration in time range)    ED Course/ Medical Decision Making/ A&P   {   Click here for ABCD2, HEART and other calculatorsREFRESH Note before signing :1}                              Medical Decision Making Amount and/or Complexity of Data Reviewed Labs: ordered. Radiology: ordered.  Risk Prescription drug management.   ***  {Document critical care time when appropriate:1} {Document review of labs and clinical decision tools ie heart score, Chads2Vasc2 etc:1}  {Document your independent review of radiology images,  and any outside records:1} {Document your discussion with family members, caretakers, and with consultants:1} {Document social determinants of health affecting pt's care:1} {Document your decision making why or why not admission, treatments were needed:1} Final Clinical Impression(s) / ED Diagnoses Final diagnoses:  None    Rx / DC Orders ED Discharge Orders     None

## 2023-06-07 NOTE — ED Triage Notes (Signed)
Patient arrives with complaints of shortness of breath and worsening productive cough x4 days. Patient is concerned because she is cough up green sputum and having more fatigue. Seen at Urgent Care yesterday and had a negative Flu test. Had covid 2 months ago.

## 2023-06-07 NOTE — ED Notes (Signed)
She does well ambulating with Robin, R.T.; and maintains SPO2 of 95% while ambulating.

## 2023-06-11 ENCOUNTER — Encounter (HOSPITAL_COMMUNITY): Payer: Self-pay

## 2023-06-11 ENCOUNTER — Emergency Department (HOSPITAL_COMMUNITY)
Admission: EM | Admit: 2023-06-11 | Discharge: 2023-06-11 | Disposition: A | Discharge status: Home / Self Care | Payer: Medicare HMO | Attending: Emergency Medicine | Admitting: Emergency Medicine

## 2023-06-11 ENCOUNTER — Other Ambulatory Visit: Payer: Self-pay

## 2023-06-11 ENCOUNTER — Emergency Department (HOSPITAL_COMMUNITY): Payer: Medicare HMO

## 2023-06-11 DIAGNOSIS — F17210 Nicotine dependence, cigarettes, uncomplicated: Secondary | ICD-10-CM | POA: Diagnosis not present

## 2023-06-11 DIAGNOSIS — R051 Acute cough: Secondary | ICD-10-CM | POA: Diagnosis not present

## 2023-06-11 DIAGNOSIS — R531 Weakness: Secondary | ICD-10-CM | POA: Diagnosis not present

## 2023-06-11 DIAGNOSIS — R197 Diarrhea, unspecified: Secondary | ICD-10-CM | POA: Insufficient documentation

## 2023-06-11 DIAGNOSIS — R0602 Shortness of breath: Secondary | ICD-10-CM | POA: Diagnosis not present

## 2023-06-11 DIAGNOSIS — R9431 Abnormal electrocardiogram [ECG] [EKG]: Secondary | ICD-10-CM | POA: Diagnosis not present

## 2023-06-11 DIAGNOSIS — R059 Cough, unspecified: Secondary | ICD-10-CM | POA: Diagnosis present

## 2023-06-11 DIAGNOSIS — R0600 Dyspnea, unspecified: Secondary | ICD-10-CM | POA: Diagnosis not present

## 2023-06-11 LAB — CBC WITH DIFFERENTIAL/PLATELET
Abs Immature Granulocytes: 0.05 10*3/uL (ref 0.00–0.07)
Basophils Absolute: 0 10*3/uL (ref 0.0–0.1)
Basophils Relative: 0 %
Eosinophils Absolute: 0.1 10*3/uL (ref 0.0–0.5)
Eosinophils Relative: 1 %
HCT: 44 % (ref 36.0–46.0)
Hemoglobin: 13.8 g/dL (ref 12.0–15.0)
Immature Granulocytes: 0 %
Lymphocytes Relative: 25 %
Lymphs Abs: 2.8 10*3/uL (ref 0.7–4.0)
MCH: 30.6 pg (ref 26.0–34.0)
MCHC: 31.4 g/dL (ref 30.0–36.0)
MCV: 97.6 fL (ref 80.0–100.0)
Monocytes Absolute: 0.7 10*3/uL (ref 0.1–1.0)
Monocytes Relative: 6 %
Neutro Abs: 7.6 10*3/uL (ref 1.7–7.7)
Neutrophils Relative %: 68 %
Platelets: 404 10*3/uL — ABNORMAL HIGH (ref 150–400)
RBC: 4.51 MIL/uL (ref 3.87–5.11)
RDW: 13.9 % (ref 11.5–15.5)
WBC: 11.1 10*3/uL — ABNORMAL HIGH (ref 4.0–10.5)
nRBC: 0 % (ref 0.0–0.2)

## 2023-06-11 LAB — COMPREHENSIVE METABOLIC PANEL
ALT: 11 U/L (ref 0–44)
AST: 12 U/L — ABNORMAL LOW (ref 15–41)
Albumin: 3.4 g/dL — ABNORMAL LOW (ref 3.5–5.0)
Alkaline Phosphatase: 51 U/L (ref 38–126)
Anion gap: 11 (ref 5–15)
BUN: 10 mg/dL (ref 8–23)
CO2: 24 mmol/L (ref 22–32)
Calcium: 9.3 mg/dL (ref 8.9–10.3)
Chloride: 108 mmol/L (ref 98–111)
Creatinine, Ser: 0.82 mg/dL (ref 0.44–1.00)
GFR, Estimated: >60 mL/min (ref >60)
Glucose, Bld: 163 mg/dL — ABNORMAL HIGH (ref 70–99)
Potassium: 3.9 mmol/L (ref 3.5–5.1)
Sodium: 143 mmol/L (ref 135–145)
Total Bilirubin: 0.7 mg/dL (ref <1.2)
Total Protein: 7.5 g/dL (ref 6.5–8.1)

## 2023-06-11 LAB — LIPASE, BLOOD: Lipase: 29 U/L (ref 11–51)

## 2023-06-11 MED ORDER — IPRATROPIUM BROMIDE 0.02 % IN SOLN
0.5000 mg | Freq: Once | RESPIRATORY_TRACT | Status: AC
Start: 2023-06-11 — End: 2023-06-11
  Administered 2023-06-11: 0.5 mg via RESPIRATORY_TRACT
  Filled 2023-06-11: qty 2.5

## 2023-06-11 MED ORDER — ALBUTEROL SULFATE (2.5 MG/3ML) 0.083% IN NEBU
5.0000 mg | INHALATION_SOLUTION | Freq: Once | RESPIRATORY_TRACT | Status: AC
Start: 2023-06-11 — End: 2023-06-11
  Administered 2023-06-11: 5 mg via RESPIRATORY_TRACT
  Filled 2023-06-11: qty 6

## 2023-06-11 MED ORDER — ALBUTEROL SULFATE HFA 108 (90 BASE) MCG/ACT IN AERS
2.0000 | INHALATION_SPRAY | RESPIRATORY_TRACT | Status: DC
  Administered 2023-06-11: 2 via RESPIRATORY_TRACT
  Filled 2023-06-11: qty 6.7

## 2023-06-11 NOTE — ED Provider Triage Note (Signed)
Emergency Medicine Provider Triage Evaluation Note  Barbara Golden , a 87 y.o. female  was evaluated in triage.  Pt complains of diarrhea, cough. Seen a few days ago and diagnosed with CAP. Given augmentin and doxy however began having nonstop loose stools yesterday. Patient states she is still coughing but denies CP or shob. Clear sputum production. No fevers.  Review of Systems  Positive: See hpi Negative: See hpi  Physical Exam  BP (!) 152/69 (BP Location: Right Arm)   Pulse 88   Temp 98 F (36.7 C) (Oral)   Resp 20   Wt 72 kg   LMP 07/21/1998 (Approximate)   SpO2 96%   BMI 26.41 kg/m  Gen:   Awake, no distress   Resp:  Normal effort  MSK:   Moves extremities without difficulty  Other:  Oral mucosa dry, lungs CTAB, non productive cough in room, abd non TTP  Medical Decision Making  Medically screening exam initiated at 2:23 PM.  Appropriate orders placed.  Barbara Golden was informed that the remainder of the evaluation will be completed by another provider, this initial triage assessment does not replace that evaluation, and the importance of remaining in the ED until their evaluation is complete.  W/u started, pt stable.   Barbara Corrigan, PA-C 06/11/23 1425

## 2023-06-11 NOTE — ED Provider Notes (Signed)
[place] EMERGENCY DEPARTMENT AT [facility] Provider Note   CSN: [id number] Arrival date & time: 06/11/23  1404     History  Chief Complaint  Patient presents with   Diarrhea   Cough   Weakness    Barbara Golden is a 87 y.o. female.  87 year old female presents with persistent cough times several days.  Diagnosed with pneumonia several days ago.  Has been on antibiotics for this.  Did develop some watery diarrhea which is since resolved.  Denies any fever.  No exertional dyspnea.  No chest pain.  No pain to her legs.  Cough is been nonproductive and relieved with taking Robitussin DM.  No trouble swallowing.  Denies any fevers.  She does endorse a over 40-pack-year history tobacco use.  Now for the last 10 years denies any history of COPD       Home Medications Prior to Admission medications   Medication Sig Start Date End Date Taking? Authorizing Provider  amoxicillin-clavulanate (AUGMENTIN) 875-125 MG tablet Take 1 tablet by mouth every 12 (twelve) hours. 06/07/23   Harris, Abigail, PA-C  benzonatate (TESSALON) 100 MG capsule Take 1 capsule (100 mg total) by mouth every 8 (eight) hours as needed for cough. 06/06/23   Gustavus Bryant, FNP  Continuous Blood Gluc Receiver (FREESTYLE LIBRE 14 DAY READER) DEVI 1 Units by Does not apply route 2 (two) times daily. H35.30, E11.65 10/07/19   Reed, Tiffany L, DO  Continuous Blood Gluc Sensor (FREESTYLE LIBRE 14 DAY SENSOR) MISC Apply 1 sensor every 14 days E11.319 06/18/22   [place], Betty G, MD  doxycycline (VIBRAMYCIN) 100 MG capsule Take 1 capsule (100 mg total) by mouth 2 (two) times daily. One po bid x 7 days 06/07/23   Arthor Captain, PA-C  Dulaglutide (TRULICITY) 1.5 MG/0.5ML SOAJ INJECT 1.5 MG INTO THE SKIN ONCE A WEEK. 05/12/23   [place], Betty G, MD  Elastic Bandages & Supports (MEDICAL COMPRESSION THIGH HIGH) MISC 1 each by Does not apply route daily. 04/12/20   Wieters, Hallie C, PA-C  famotidine (PEPCID) 10 MG  tablet Take 10 mg by mouth as needed for heartburn or indigestion.    [provider]  fexofenadine (ALLEGRA ALLERGY) 60 MG tablet 1 po qd    [provider]  gabapentin (NEURONTIN) 100 MG capsule TAKE 1 CAPSULE (100 MG TOTAL) BY MOUTH THREE TIMES DAILY. 12/09/22   [place], Betty G, MD  JARDIANCE 25 MG TABS tablet TAKE 1 TABLET BY MOUTH EVERY DAY BEFORE BREAKFAST 02/16/23   [place], Betty G, MD  Multiple Vitamins-Minerals (PRESERVISION AREDS) CAPS Take 1 capsule by mouth daily. 08/21/20   [provider]  rosuvastatin (CRESTOR) 5 MG tablet Take 5 mg by mouth daily. 08/17/20   [provider]  cetirizine (ZYRTEC) 10 MG tablet Take 10 mg by mouth daily as needed for allergies.  09/13/19  [provider]      Allergies    Ammonia and Codeine    Review of Systems   Review of Systems  All other systems reviewed and are negative.   Physical Exam Updated Vital Signs BP (!) 142/113   Pulse 82   Temp 98.3 F (36.8 C) (Oral)   Resp 18   Wt 72 kg   LMP 07/21/1998 (Approximate)   SpO2 92%   BMI 26.41 kg/m  Physical Exam Vitals and nursing note reviewed.  Constitutional:      General: She is not in acute distress.    Appearance: Normal appearance.  She is well-developed. She is not toxic-appearing.  HENT:     Head: Normocephalic and atraumatic.  Eyes:     General: Lids are normal.     Conjunctiva/sclera: Conjunctivae normal.     Pupils: Pupils are equal, round, and reactive to light.  Neck:     Thyroid: No thyroid mass.     Trachea: No tracheal deviation.  Cardiovascular:     Rate and Rhythm: Normal rate and regular rhythm.     Heart sounds: Normal heart sounds. No murmur heard.    No gallop.  Pulmonary:     Effort: Pulmonary effort is normal. No respiratory distress.     Breath sounds: No stridor. Examination of the right-upper field reveals decreased breath sounds. Examination of the left-upper field reveals decreased breath sounds. Decreased  breath sounds present. No wheezing, rhonchi or rales.  Abdominal:     General: There is no distension.     Palpations: Abdomen is soft.     Tenderness: There is no abdominal tenderness. There is no rebound.  Musculoskeletal:        General: No tenderness. Normal range of motion.     Cervical back: Normal range of motion and neck supple.  Skin:    General: Skin is warm and dry.     Findings: No abrasion or rash.  Neurological:     Mental Status: She is alert and oriented to person, place, and time. Mental status is at baseline.     GCS: GCS eye subscore is 4. GCS verbal subscore is 5. GCS motor subscore is 6.     Cranial Nerves: Cranial nerves are intact. No cranial nerve deficit.     Sensory: No sensory deficit.     Motor: Motor function is intact.  Psychiatric:        Attention and Perception: Attention normal.        Speech: Speech normal.        Behavior: Behavior normal.     ED Results / Procedures / Treatments   Labs (all labs ordered are listed, but only abnormal results are displayed) Labs Reviewed  CBC WITH DIFFERENTIAL/PLATELET - Abnormal; Notable for the following components:      Result Value   WBC 11.1 (*)    Platelets 404 (*)    All other components within normal limits  COMPREHENSIVE METABOLIC PANEL - Abnormal; Notable for the following components:   Glucose, Bld 163 (*)    Albumin 3.4 (*)    AST 12 (*)    All other components within normal limits  C DIFFICILE QUICK SCREEN W PCR REFLEX    LIPASE, BLOOD  URINALYSIS, ROUTINE W REFLEX MICROSCOPIC    EKG EKG Interpretation Date/Time:  Thursday June 11 2023 14:11:10 EST Ventricular Rate:  88 PR Interval:  145 QRS Duration:  80 QT Interval:  349 QTC Calculation: 423 R Axis:   54  Text Interpretation: Sinus rhythm Low voltage, precordial leads Borderline repolarization abnormality Confirmed by Lorre Nick ([id number]) on 06/11/2023 6:11:50 PM  Radiology DG Chest Portable 1 View  Result Date:  06/11/2023 CLINICAL DATA:  Cough.  Dyspnea. EXAM: PORTABLE CHEST 1 VIEW COMPARISON:  06/07/2023. FINDINGS: Bilateral lung fields are clear. Bilateral costophrenic angles are clear. Stable cardio-mediastinal silhouette. No acute osseous abnormalities. The soft tissues are within normal limits. IMPRESSION: *No active disease. Electronically Signed   By: Jules Schick M.D.   On: 06/11/2023 16:41    Procedures Procedures    Medications Ordered in ED Medications  albuterol (  PROVENTIL) (2.5 MG/3ML) 0.083% nebulizer solution 5 mg (has no administration in time range)  ipratropium (ATROVENT) nebulizer solution 0.5 mg (has no administration in time range)    ED Course/ Medical Decision Making/ A&P                                 Medical Decision Making Risk Prescription drug management.   Patient unable to give a stool sample at this time.  Chest x-ray per interpretation shows no acute findings.  Appears that her pneumonia has resolved.  She has not had any diarrhea here.  She cannot give a sample for C. difficile however I feel this is less likely.  CBC electrolytes without significant abnormality.  She did have some coughing here which I suspect is from a bronchitis.  Given albuterol and does feel better.  Will give albuterol inhaler to go home with.        Final Clinical Impression(s) / ED Diagnoses Final diagnoses:  None    Rx / DC Orders ED Discharge Orders     None         Lorre Nick, MD 06/11/23 [phone]

## 2023-06-11 NOTE — ED Triage Notes (Signed)
C/o weakness, diarrhea, unproductive cough, and nausea x1 day.  Pt reports started on abx on Sunday for PNA and has had no improvement.  Denies fever, body aches, chills

## 2023-06-11 NOTE — ED Notes (Signed)
Attempted stick n  failed.

## 2023-06-11 NOTE — Discharge Instructions (Addendum)
Use 1 to 2 puffs of the inhaler every 4-6 hours as needed.  Return here for any trouble breathing

## 2023-06-12 ENCOUNTER — Other Ambulatory Visit: Payer: Self-pay | Admitting: Family Medicine

## 2023-06-12 DIAGNOSIS — G629 Polyneuropathy, unspecified: Secondary | ICD-10-CM

## 2023-06-12 DIAGNOSIS — H3530 Unspecified macular degeneration: Secondary | ICD-10-CM

## 2023-06-12 DIAGNOSIS — E1165 Type 2 diabetes mellitus with hyperglycemia: Secondary | ICD-10-CM

## 2023-06-19 ENCOUNTER — Other Ambulatory Visit: Payer: Self-pay | Admitting: Family Medicine

## 2023-07-01 ENCOUNTER — Inpatient Hospital Stay: Payer: Medicare HMO | Admitting: Obstetrics & Gynecology

## 2023-07-01 ENCOUNTER — Telehealth: Payer: Self-pay | Admitting: *Deleted

## 2023-07-01 DIAGNOSIS — C541 Malignant neoplasm of endometrium: Secondary | ICD-10-CM

## 2023-07-01 NOTE — Telephone Encounter (Signed)
Received a call this morning from patient's daughter Elita Quick who states patient woke up this morning feeling achy and will have to cancel her scheduled appointment today at 2 pm with Dr. Tamela Oddi. Advised Pam the office would call back at a later date to schedule follow up in January. Pam verbalized understanding and thanked the office.

## 2023-07-06 ENCOUNTER — Encounter: Payer: Self-pay | Admitting: Family Medicine

## 2023-07-06 ENCOUNTER — Ambulatory Visit (INDEPENDENT_AMBULATORY_CARE_PROVIDER_SITE_OTHER): Payer: Medicare HMO | Admitting: Family Medicine

## 2023-07-06 VITALS — BP 118/62 | HR 88 | Resp 16 | Ht 65.0 in | Wt 158.2 lb

## 2023-07-06 DIAGNOSIS — K219 Gastro-esophageal reflux disease without esophagitis: Secondary | ICD-10-CM | POA: Diagnosis not present

## 2023-07-06 DIAGNOSIS — R059 Cough, unspecified: Secondary | ICD-10-CM

## 2023-07-06 MED ORDER — ALBUTEROL SULFATE HFA 108 (90 BASE) MCG/ACT IN AERS: 2.0000 | INHALATION_SPRAY | Freq: Four times a day (QID) | RESPIRATORY_TRACT | 0 refills | Status: DC | PRN

## 2023-07-06 MED ORDER — PANTOPRAZOLE SODIUM 40 MG PO TBEC: 40.0000 mg | DELAYED_RELEASE_TABLET | Freq: Every day | ORAL | 0 refills | Status: DC

## 2023-07-06 NOTE — Progress Notes (Signed)
ACUTE VISIT Chief Complaint  Patient presents with   Follow-up   HPI: [name] is a 87 y.o. female with a PMHx significant for DM II, HTN, HLD, macular degeneration, back pain, varicose vein disease, endometrial cancer, and neuropathy, who is here today with her daughter for ED follow up.   Patient was seen in ED on 11/16, 11/17, and 11/21 initially dx'ed with viral URI, community acquired pneumonia, and persistent cough, respectively. She was given albuterol inh, which helped her cough some, but is out now.   Currently, she says she is feeling alright in general, but having some occasional fatigue which started after her second ED visit. She is still coughing, and feels like there is phlegm in her throat, but is not coughing anything up.   She also endorses some abdominal soreness from her cough and occasional heartburn depending on what she eats. She takes pepcid 10 mg most days. She hasn't tried PPI's.   Denies any SOB, wheezing, chest pain, nausea,vomiting,or abdominal pain.  Lab Results  Component Value Date   LIPASE 29 06/11/2023   Lab Results  Component Value Date   NA 143 06/11/2023   CL 108 06/11/2023   K 3.9 06/11/2023   CO2 24 06/11/2023   BUN 10 06/11/2023   CREATININE 0.82 06/11/2023   GFRNONAA >60 06/11/2023   CALCIUM 9.3 06/11/2023   PHOS 2.9 04/15/2023   ALBUMIN 3.4 (L) 06/11/2023   GLUCOSE 163 (H) 06/11/2023   Lab Results  Component Value Date   ALT 11 06/11/2023   AST 12 (L) 06/11/2023   ALKPHOS 51 06/11/2023   BILITOT 0.7 06/11/2023   Lab Results  Component Value Date   WBC 11.1 (H) 06/11/2023   HGB 13.8 06/11/2023   HCT 44.0 06/11/2023   MCV 97.6 06/11/2023   PLT 404 (H) 06/11/2023   Diabetes Mellitus II:  - Her daughter is concerned about high BS's. She has been checking her sugar regularly at home. She says it has been as high as 187.  - Medications: Currently on Jardiance 25 mg daily and Trulicity 1.5 mg weekly.  Lab Results   Component Value Date   HGBA1C 8.7 (H) 04/13/2023   Review of Systems  Constitutional:  Positive for activity change and fatigue. Negative for appetite change, chills and fever.  Cardiovascular:  Negative for palpitations and leg swelling.  Endocrine: Negative for cold intolerance and heat intolerance.  Genitourinary:  Negative for decreased urine volume, dysuria and hematuria.  Skin:  Negative for rash.  Allergic/Immunologic: Positive for environmental allergies.  Neurological:  Negative for syncope, weakness and headaches.  Psychiatric/Behavioral:  Negative for confusion and hallucinations.   See other pertinent positives and negatives in HPI.  Current Outpatient Medications on File Prior to Visit  Medication Sig Dispense Refill   benzonatate (TESSALON) 100 MG capsule Take 1 capsule (100 mg total) by mouth every 8 (eight) hours as needed for cough. 21 capsule 0   Continuous Blood Gluc Receiver (FREESTYLE LIBRE 14 DAY READER) DEVI 1 Units by Does not apply route 2 (two) times daily. H35.30, E11.65 1 each 2   Continuous Glucose Sensor (FREESTYLE LIBRE 14 DAY SENSOR) MISC APPLY 1 SENSOR EVERY 14 DAYS 3 each 11   Dulaglutide (TRULICITY) 1.5 MG/0.5ML SOAJ INJECT 1.5 MG INTO THE SKIN ONCE A WEEK. 2 mL 3   Elastic Bandages & Supports (MEDICAL COMPRESSION THIGH HIGH) MISC 1 each by Does not apply route daily. 2 each 0   famotidine (PEPCID) 10 MG  tablet Take 10 mg by mouth as needed for heartburn or indigestion.     fexofenadine (ALLEGRA ALLERGY) 60 MG tablet 1 po qd     gabapentin (NEURONTIN) 100 MG capsule TAKE 1 CAPSULE (100 MG TOTAL) BY MOUTH 3 TIMES A DAY 90 capsule 3   JARDIANCE 25 MG TABS tablet TAKE 1 TABLET BY MOUTH EVERY DAY BEFORE BREAKFAST 30 tablet 3   Multiple Vitamins-Minerals (PRESERVISION AREDS) CAPS Take 1 capsule by mouth daily.     rosuvastatin (CRESTOR) 5 MG tablet Take 5 mg by mouth daily.     [DISCONTINUED] cetirizine (ZYRTEC) 10 MG tablet Take 10 mg by mouth daily as  needed for allergies.     No current facility-administered medications on file prior to visit.    Past Medical History:  Diagnosis Date   Allergy    Cancer (HCC)    endometrial   Cataract    Diabetes mellitus without complication (HCC)    type 2   GERD (gastroesophageal reflux disease)    High cholesterol    Jaundice    as a child   Macular degeneration    Nuclear sclerotic cataract of both eyes 09/05/2020   Varicose veins of lower extremity    right leg   Allergies  Allergen Reactions   Ammonia    Codeine Other (See Comments)    Causes lip burning and hyperactivity    Social History   Socioeconomic History   Marital status: Widowed    Spouse name: Not on file   Number of children: 1   Years of education: Not on file   Highest education level: Not on file  Occupational History   Occupation: retired  Tobacco Use   Smoking status: Former    Current packs/day: 0.00    Types: Cigarettes    Quit date: 03/26/2009    Years since quitting: 14.2   Smokeless tobacco: Never  Vaping Use   Vaping status: Never Used  Substance and Sexual Activity   Alcohol use: No   Drug use: No   Sexual activity: Not Currently  Other Topics Concern   Not on file  Social History Narrative   3 Step Daughters/lives with Daughter(Barbara Golden) and son-in-law.   Social Drivers of Corporate investment banker Strain: Low Risk  (03/03/2023)   Overall Financial Resource Strain (CARDIA)    Difficulty of Paying Living Expenses: Not hard at all  Food Insecurity: No Food Insecurity (04/14/2023)   Hunger Vital Sign    Worried About Running Out of Food in the Last Year: Never true    Ran Out of Food in the Last Year: Never true  Transportation Needs: No Transportation Needs (04/14/2023)   PRAPARE - Administrator, Civil Service (Medical): No    Lack of Transportation (Non-Medical): No  Physical Activity: Insufficiently Active (03/03/2023)   Exercise Vital Sign    Days of Exercise per  Week: 7 days    Minutes of Exercise per Session: 10 min  Stress: No Stress Concern Present (03/03/2023)   [employer] of Occupational Health - Occupational Stress Questionnaire    Feeling of Stress : Not at all  Social Connections: Not on file    Vitals:   07/06/23 1357  BP: 118/62  Pulse: 88  Resp: 16  SpO2: 97%   Body mass index is 26.33 kg/m.  Physical Exam Vitals and nursing note reviewed.  Constitutional:      General: She is not in acute distress.  Appearance: She is well-developed.  HENT:     Head: Normocephalic and atraumatic.     Mouth/Throat:     Mouth: Mucous membranes are moist.     Pharynx: Uvula midline. Postnasal drip present.  Eyes:     Conjunctiva/sclera: Conjunctivae normal.  Cardiovascular:     Rate and Rhythm: Normal rate and regular rhythm.     Pulses:          Posterior tibial pulses are 2+ on the right side and 2+ on the left side.     Heart sounds: No murmur heard. Pulmonary:     Effort: Pulmonary effort is normal. No respiratory distress.     Breath sounds: Normal breath sounds.  Abdominal:     Palpations: Abdomen is soft. There is no hepatomegaly or mass.     Tenderness: There is no abdominal tenderness.  Musculoskeletal:     Right lower leg: No edema.     Left lower leg: No edema.  Lymphadenopathy:     Cervical: No cervical adenopathy.  Skin:    General: Skin is warm.     Findings: No erythema or rash.  Neurological:     Mental Status: She is alert and oriented to person, place, and time.     Comments: Gait assisted with a cane.  Psychiatric:        Mood and Affect: Mood and affect normal.   ASSESSMENT AND PLAN:  Ms. Trindle was seen today for ER follow up.  Cough, unspecified type We discussed possible etiologies, ? Post viral, COPD,and GERD among some. CXR 9/25,10/7,11/17,and 06/11/23 negative for acute process. We discussed chest CT, we decided to hold on it for now. She reports that cough has improved, it is mainly at  bedtime. Albuterol inh has helped some, so she can continue 1-2 puff qid as needed. Plain Mucinex. Continue Benzonatate 100 mg tid prn. Instructed about warning signs.  Instructed about warning signs.  -     Albuterol Sulfate HFA; Inhale 2 puffs into the lungs every 6 (six) hours as needed for wheezing or shortness of breath.  Dispense: 8 g; Refill: 0  Gastroesophageal reflux disease, unspecified whether esophagitis present This problem could be contributing ti her cough. She agrees with PPI trial, Protonix 40 mg daily. GERD precautions also recommended.  -     Pantoprazole Sodium; Take 1 tablet (40 mg total) by mouth daily before breakfast.  Dispense: 60 tablet; Refill: 0  I spent a total of 34 minutes in both face to face and non face to face activities for this visit on the date of this encounter. During this time history was obtained and documented, examination was performed, prior labs/imaging reviewed, and assessment/plan discussed.  In regard to DM II she has an appt 07/28/23. Explained that acute illness could affect glucose control. For now no changes in current management.  Return if symptoms worsen or fail to improve, for keep next appointment.  I, Rolla Etienne Wierda, acting as a scribe for Everett Ehrler [place], MD., have documented all relevant documentation on the behalf of Kassaundra Hair [place], MD, as directed by  Alveda Vanhorne [place], MD while in the presence of Sheria Rosello [place], MD.   I, Tayler Heiden [place], MD, have reviewed all documentation for this visit. The documentation on 07/06/23 for the exam, diagnosis, procedures, and orders are all accurate and complete.  Breandan People G. [place], MD  [facility]. [place] office.

## 2023-07-06 NOTE — Patient Instructions (Addendum)
A few things to remember from today's visit:  Cough, unspecified type  Gastroesophageal reflux disease, unspecified whether esophagitis present - Plan: pantoprazole (PROTONIX) 40 MG tablet  Protonix 30 min before breakfast or 3 hours after dinner with empty stomach. Albuterol inh to continue 1-2 puff 3-4 times per day as needed. Plain Mucinex may help with cough. Monitor for new symptoms.  If you need refills for medications you take chronically, please call your pharmacy. Do not use My Chart to request refills or for acute issues that need immediate attention. If you send a my chart message, it may take a few days to be addressed, specially if I am not in the office.  Please be sure medication list is accurate. If a new problem present, please set up appointment sooner than planned today.

## 2023-07-21 ENCOUNTER — Telehealth: Payer: Self-pay | Admitting: *Deleted

## 2023-07-21 NOTE — Telephone Encounter (Signed)
 Spoke with the patient and the daughter to schedule a follow up appt with Dr Tamela Oddi on 1/29

## 2023-07-28 ENCOUNTER — Encounter: Payer: Self-pay | Admitting: Family Medicine

## 2023-07-28 ENCOUNTER — Ambulatory Visit: Payer: Medicare HMO | Admitting: Family Medicine

## 2023-07-28 VITALS — BP 120/68 | HR 100 | Resp 16 | Ht 65.0 in | Wt 158.1 lb

## 2023-07-28 DIAGNOSIS — E782 Mixed hyperlipidemia: Secondary | ICD-10-CM

## 2023-07-28 DIAGNOSIS — E1142 Type 2 diabetes mellitus with diabetic polyneuropathy: Secondary | ICD-10-CM | POA: Diagnosis not present

## 2023-07-28 DIAGNOSIS — Z7984 Long term (current) use of oral hypoglycemic drugs: Secondary | ICD-10-CM

## 2023-07-28 DIAGNOSIS — C541 Malignant neoplasm of endometrium: Secondary | ICD-10-CM | POA: Diagnosis not present

## 2023-07-28 DIAGNOSIS — G63 Polyneuropathy in diseases classified elsewhere: Secondary | ICD-10-CM | POA: Insufficient documentation

## 2023-07-28 DIAGNOSIS — G629 Polyneuropathy, unspecified: Secondary | ICD-10-CM

## 2023-07-28 LAB — POCT GLYCOSYLATED HEMOGLOBIN (HGB A1C): Hemoglobin A1C: 8.3 % — AB (ref 4.0–5.6)

## 2023-07-28 MED ORDER — EMPAGLIFLOZIN-METFORMIN HCL 12.5-500 MG PO TABS: 1.0000 | ORAL_TABLET | Freq: Two times a day (BID) | ORAL | 1 refills | Status: DC

## 2023-07-28 MED ORDER — GABAPENTIN 100 MG PO CAPS: 100.0000 mg | ORAL_CAPSULE | Freq: Every day | ORAL | 2 refills | Status: DC

## 2023-07-28 NOTE — Progress Notes (Signed)
 HPI: [name] is a 88 y.o. female with a PMHx significant for DM II, HTN, HLD, macular degeneration, back pain, varicose vein disease, endometrial cancer, and neuropathy, who is here today with her daughter for chronic disease management.  Last seen on 07/06/2023  Diabetes Mellitus II: Dx'ed in 2019 at age 89. - Checking BG at home: She has been checking her blood sugar regularly. She says it has been high lately and attributes that to her diet. - Medications: Currently on Jardiance  25 mg daily and Trulicity  1.5 mg weekly. She has been on metformin  in the past but stopped because she had diarrhea.  - eye exam: Her eye care provider retired, so she would like a referral to see a new doctor.  - foot exam: Over a year ago. - Negative for symptoms of hypoglycemia, polyuria, polydipsia, foot ulcers/trauma  Peripheral neuropathy :She is currently taking gabapentin  100 mg at bedtime, which she believes is effective in controlling her symptoms.  Lab Results  Component Value Date   HGBA1C 8.7 (A) 07/28/2023   Lab Results  Component Value Date   MICROALBUR 1.3 04/27/2023   Hyperlipidemia: Currently on rosuvastatin  5 mg daily.  Side effects from medication: none Lab Results  Component Value Date   CHOL 118 01/11/2021   HDL 43 (L) 01/11/2021   LDLCALC 51 01/11/2021   TRIG 162 (H) 01/11/2021   CHOLHDL 2.7 01/11/2021   Concerns today:   Patient complains her throat is sore and dry in the mornings when she wakes up. It improves when she gets a sip of water .  She mentions she often breathes through her mouth.  She takes allegra 60 mg daily for allergies and protonix  40 mg daily for GERD.   She says she is still coughing, but it has improved since her last visit. The cough is worse at night. Denies any heartburn or wheezing.   Endometrial cancer: She has established with new gyn oncologist. Last visit 07/01/23.  Review of Systems  Constitutional:  Negative for activity  change, appetite change, chills and fever.  HENT:  Negative for mouth sores and trouble swallowing.   Gastrointestinal:  Negative for abdominal pain, nausea and vomiting.  Endocrine: Negative for cold intolerance and heat intolerance.  Genitourinary:  Negative for decreased urine volume, dysuria and hematuria.  Skin:  Negative for rash.  Allergic/Immunologic: Positive for environmental allergies.  Neurological:  Negative for syncope and facial asymmetry.  Psychiatric/Behavioral:  Negative for confusion and hallucinations.   See other pertinent positives and negatives in HPI.  Current Outpatient Medications on File Prior to Visit  Medication Sig Dispense Refill   albuterol  (VENTOLIN  HFA) 108 (90 Base) MCG/ACT inhaler Inhale 2 puffs into the lungs every 6 (six) hours as needed for wheezing or shortness of breath. 8 g 0   Continuous Blood Gluc Receiver (FREESTYLE LIBRE 14 DAY READER) DEVI 1 Units by Does not apply route 2 (two) times daily. H35.30, E11.65 1 each 2   Continuous Glucose Sensor (FREESTYLE LIBRE 14 DAY SENSOR) MISC APPLY 1 SENSOR EVERY 14 DAYS 3 each 11   Dulaglutide  (TRULICITY ) 1.5 MG/0.5ML SOAJ INJECT 1.5 MG INTO THE SKIN ONCE A WEEK. 2 mL 3   Elastic Bandages & Supports (MEDICAL COMPRESSION THIGH HIGH) MISC 1 each by Does not apply route daily. 2 each 0   famotidine  (PEPCID ) 10 MG tablet Take 10 mg by mouth as needed for heartburn or indigestion.     fexofenadine (ALLEGRA ALLERGY) 60 MG tablet 1 po qd  Multiple Vitamins-Minerals (PRESERVISION AREDS) CAPS Take 1 capsule by mouth daily.     pantoprazole  (PROTONIX ) 40 MG tablet Take 1 tablet (40 mg total) by mouth daily before breakfast. 60 tablet 0   rosuvastatin  (CRESTOR ) 5 MG tablet Take 5 mg by mouth daily.     [DISCONTINUED] cetirizine (ZYRTEC) 10 MG tablet Take 10 mg by mouth daily as needed for allergies.     No current facility-administered medications on file prior to visit.    Past Medical History:  Diagnosis Date    Allergy    Cancer (HCC)    endometrial   Cataract    Diabetes mellitus without complication (HCC)    type 2   GERD (gastroesophageal reflux disease)    High cholesterol    Jaundice    as a child   Macular degeneration    Nuclear sclerotic cataract of both eyes 09/05/2020   Varicose veins of lower extremity    right leg   Allergies  Allergen Reactions   Ammonia    Codeine Other (See Comments)    Causes lip burning and hyperactivity    Social History   Socioeconomic History   Marital status: Widowed    Spouse name: Not on file   Number of children: 1   Years of education: Not on file   Highest education level: Not on file  Occupational History   Occupation: retired  Tobacco Use   Smoking status: Former    Current packs/day: 0.00    Types: Cigarettes    Quit date: 03/26/2009    Years since quitting: 14.3   Smokeless tobacco: Never  Vaping Use   Vaping status: Never Used  Substance and Sexual Activity   Alcohol use: No   Drug use: No   Sexual activity: Not Currently  Other Topics Concern   Not on file  Social History Narrative   3 Step Daughters/lives with Daughter(Pam Bouwer) and son-in-law.   Social Drivers of Corporate Investment Banker Strain: Low Risk  (03/03/2023)   Overall Financial Resource Strain (CARDIA)    Difficulty of Paying Living Expenses: Not hard at all  Food Insecurity: No Food Insecurity (04/14/2023)   Hunger Vital Sign    Worried About Running Out of Food in the Last Year: Never true    Ran Out of Food in the Last Year: Never true  Transportation Needs: No Transportation Needs (04/14/2023)   PRAPARE - Administrator, Civil Service (Medical): No    Lack of Transportation (Non-Medical): No  Physical Activity: Insufficiently Active (03/03/2023)   Exercise Vital Sign    Days of Exercise per Week: 7 days    Minutes of Exercise per Session: 10 min  Stress: No Stress Concern Present (03/03/2023)   [employer] of Occupational  Health - Occupational Stress Questionnaire    Feeling of Stress : Not at all  Social Connections: Not on file   Today's Vitals   07/28/23 1432  BP: 120/68  Pulse: 100  Resp: 16  SpO2: 94%  Weight: 158 lb 2 oz (71.7 kg)  Height: 5' 5 (1.651 m)   Body mass index is 26.31 kg/m.  Physical Exam Vitals and nursing note reviewed.  Constitutional:      General: She is not in acute distress.    Appearance: She is well-developed.  HENT:     Head: Normocephalic and atraumatic.     Mouth/Throat:     Mouth: Mucous membranes are moist.     Pharynx: Oropharynx is  clear.  Eyes:     Conjunctiva/sclera: Conjunctivae normal.  Cardiovascular:     Rate and Rhythm: Normal rate and regular rhythm.     Pulses:          Dorsalis pedis pulses are 2+ on the right side and 2+ on the left side.     Heart sounds: No murmur heard. Pulmonary:     Effort: Pulmonary effort is normal. No respiratory distress.     Breath sounds: Normal breath sounds.  Abdominal:     Palpations: Abdomen is soft. There is no mass.     Tenderness: There is no abdominal tenderness.  Musculoskeletal:     Right lower leg: No edema.     Left lower leg: No edema.  Lymphadenopathy:     Cervical: No cervical adenopathy.  Skin:    General: Skin is warm.     Findings: No erythema or rash.  Neurological:     General: No focal deficit present.     Mental Status: She is alert and oriented to person, place, and time.     Cranial Nerves: No cranial nerve deficit.     Gait: Gait normal.  Psychiatric:        Mood and Affect: Mood and affect normal.    ASSESSMENT AND PLAN:  Ms. Pesantez was seen today for chronic follow up.   Orders Placed This Encounter  Procedures   POC HgB A1c   Lab Results  Component Value Date   HGBA1C 8.3 (A) 07/28/2023   Type 2 diabetes mellitus with diabetic polyneuropathy, without long-term current use of insulin  (HCC) Assessment & Plan: HgA1C is noat goal, HgA1C went from 8.7 to 8.3. She had  diarrhea with Metformin  in the past, when she was taking 1000 mg bid. She agrees with trying combination empagliflozin -metformin  12.5-500 mg bid with food. Continue Trulicity  1.5 mg weekly. Regular exercise as tolerated and healthy diet with avoidance of added sugar food intake is an important part of treatment and recommended. Annual eye exam, periodic dental and foot care recommended. F/U in 4 months.  Orders: -     POCT glycosylated hemoglobin (Hb A1C) -     Empagliflozin -metFORMIN  HCl; Take 1 tablet by mouth 2 (two) times daily with a meal.  Dispense: 60 tablet; Refill: 1  Polyneuropathy associated with underlying disease (HCC) Assessment & Plan: Symptoms have improved with Gabapentin , which she is taking at bedtime 100 mg. No changes in current management. Some side effects of med discussed. Continue good foot care.  Orders: -     Gabapentin ; Take 1 capsule (100 mg total) by mouth at bedtime.  Dispense: 30 capsule; Refill: 2  Endometrial cancer [facility]) Assessment & Plan: Follows annually with gyn oncologist, last visit 07/01/23.   Mixed hyperlipidemia Assessment & Plan: Last LDL 51 in 12/2022. She is not fasting today. Continue Rosuvastatin  5 mg daily and low fat diet. Will plan on fasting labs next visit.   Return in about 4 months (around 11/25/2023) for chronic problems.  I, Leonce PARAS Wierda, acting as a scribe for Skeeter Sheard, MD., have documented all relevant documentation on the behalf of Doneta Bayman, MD, as directed by  Zaila Crew, MD while in the presence of Margie Brink, MD.   I, Camella Seim, have reviewed all documentation for this visit. The documentation on 07/30/23 for the exam, diagnosis, procedures, and orders are all accurate and complete.  Eilee Schader G. Able Malloy, MD  [facility]. [place] office.

## 2023-07-28 NOTE — Patient Instructions (Addendum)
 A few things to remember from today's visit:  Type 2 diabetes mellitus with diabetic polyneuropathy, without long-term current use of insulin  (HCC) - Plan: POC HgB A1c, Empagliflozin -metFORMIN  HCl 12.5-500 MG TABS  Polyneuropathy, unspecified - Plan: gabapentin  (NEURONTIN ) 100 MG capsule  Combination metformin  and Jardiance  sent to your pharmacy to try. Let me know about tolerance in a few weeks. Fasting labs next visit.  If you need refills for medications you take chronically, please call your pharmacy. Do not use My Chart to request refills or for acute issues that need immediate attention. If you send a my chart message, it may take a few days to be addressed, specially if I am not in the office.  Please be sure medication list is accurate. If a new problem present, please set up appointment sooner than planned today.

## 2023-07-30 NOTE — Assessment & Plan Note (Addendum)
 HgA1C is noat goal, HgA1C went from 8.7 to 8.3. She had diarrhea with Metformin  in the past, when she was taking 1000 mg bid. She agrees with trying combination empagliflozin -metformin  12.5-500 mg bid with food. Continue Trulicity  1.5 mg weekly. Regular exercise as tolerated and healthy diet with avoidance of added sugar food intake is an important part of treatment and recommended. Annual eye exam, periodic dental and foot care recommended. F/U in 4 months.

## 2023-07-30 NOTE — Assessment & Plan Note (Signed)
 Symptoms have improved with Gabapentin, which she is taking at bedtime 100 mg. No changes in current management. Some side effects of med discussed. Continue good foot care.

## 2023-07-30 NOTE — Assessment & Plan Note (Addendum)
 Follows annually with gyn oncologist, last visit 07/01/23.

## 2023-07-30 NOTE — Assessment & Plan Note (Signed)
 Last LDL 51 in 12/2022. She is not fasting today. Continue Rosuvastatin 5 mg daily and low fat diet. Will plan on fasting labs next visit.

## 2023-08-02 ENCOUNTER — Other Ambulatory Visit: Payer: Self-pay | Admitting: Family Medicine

## 2023-08-19 ENCOUNTER — Inpatient Hospital Stay: Payer: Medicare HMO | Attending: Obstetrics & Gynecology | Admitting: Obstetrics & Gynecology

## 2023-08-19 DIAGNOSIS — C541 Malignant neoplasm of endometrium: Secondary | ICD-10-CM

## 2023-08-21 ENCOUNTER — Observation Stay (HOSPITAL_COMMUNITY)
Admission: EM | Admit: 2023-08-21 | Discharge: 2023-08-23 | Disposition: A | Discharge status: Home / Self Care | Payer: Medicare HMO | Attending: Internal Medicine | Admitting: Internal Medicine

## 2023-08-21 ENCOUNTER — Encounter (HOSPITAL_COMMUNITY): Payer: Self-pay

## 2023-08-21 ENCOUNTER — Other Ambulatory Visit: Payer: Self-pay

## 2023-08-21 DIAGNOSIS — Z20822 Contact with and (suspected) exposure to covid-19: Secondary | ICD-10-CM | POA: Diagnosis not present

## 2023-08-21 DIAGNOSIS — I959 Hypotension, unspecified: Secondary | ICD-10-CM | POA: Diagnosis not present

## 2023-08-21 DIAGNOSIS — Z743 Need for continuous supervision: Secondary | ICD-10-CM | POA: Diagnosis not present

## 2023-08-21 DIAGNOSIS — E119 Type 2 diabetes mellitus without complications: Secondary | ICD-10-CM | POA: Diagnosis not present

## 2023-08-21 DIAGNOSIS — E785 Hyperlipidemia, unspecified: Secondary | ICD-10-CM | POA: Insufficient documentation

## 2023-08-21 DIAGNOSIS — R531 Weakness: Secondary | ICD-10-CM | POA: Diagnosis not present

## 2023-08-21 DIAGNOSIS — R231 Pallor: Secondary | ICD-10-CM | POA: Diagnosis not present

## 2023-08-21 DIAGNOSIS — Z87891 Personal history of nicotine dependence: Secondary | ICD-10-CM | POA: Insufficient documentation

## 2023-08-21 DIAGNOSIS — R112 Nausea with vomiting, unspecified: Secondary | ICD-10-CM

## 2023-08-21 DIAGNOSIS — R2689 Other abnormalities of gait and mobility: Secondary | ICD-10-CM | POA: Diagnosis not present

## 2023-08-21 DIAGNOSIS — K219 Gastro-esophageal reflux disease without esophagitis: Secondary | ICD-10-CM

## 2023-08-21 DIAGNOSIS — R55 Syncope and collapse: Secondary | ICD-10-CM | POA: Diagnosis not present

## 2023-08-21 DIAGNOSIS — R197 Diarrhea, unspecified: Secondary | ICD-10-CM | POA: Diagnosis not present

## 2023-08-21 DIAGNOSIS — R0902 Hypoxemia: Secondary | ICD-10-CM | POA: Diagnosis not present

## 2023-08-21 DIAGNOSIS — Z8542 Personal history of malignant neoplasm of other parts of uterus: Secondary | ICD-10-CM | POA: Diagnosis not present

## 2023-08-21 DIAGNOSIS — K429 Umbilical hernia without obstruction or gangrene: Secondary | ICD-10-CM | POA: Diagnosis not present

## 2023-08-21 NOTE — ED Triage Notes (Signed)
Pt complaining of getting dizzy at home after she went to the bathroom, fell in the doorway of the bathroom denies hitting her head.  She then had an episode of nausea, vomiting, and diarrhea that started at 9 pm

## 2023-08-22 ENCOUNTER — Emergency Department (HOSPITAL_COMMUNITY): Payer: Medicare HMO

## 2023-08-22 DIAGNOSIS — R42 Dizziness and giddiness: Secondary | ICD-10-CM

## 2023-08-22 DIAGNOSIS — K429 Umbilical hernia without obstruction or gangrene: Secondary | ICD-10-CM | POA: Diagnosis not present

## 2023-08-22 DIAGNOSIS — R55 Syncope and collapse: Secondary | ICD-10-CM | POA: Diagnosis present

## 2023-08-22 LAB — CBC
HCT: 43.5 % (ref 36.0–46.0)
Hemoglobin: 13.8 g/dL (ref 12.0–15.0)
MCH: 30.2 pg (ref 26.0–34.0)
MCHC: 31.7 g/dL (ref 30.0–36.0)
MCV: 95.2 fL (ref 80.0–100.0)
Platelets: 253 10*3/uL (ref 150–400)
RBC: 4.57 MIL/uL (ref 3.87–5.11)
RDW: 13.5 % (ref 11.5–15.5)
WBC: 14.7 10*3/uL — ABNORMAL HIGH (ref 4.0–10.5)
nRBC: 0 % (ref 0.0–0.2)

## 2023-08-22 LAB — COMPREHENSIVE METABOLIC PANEL
ALT: 13 U/L (ref 0–44)
AST: 15 U/L (ref 15–41)
Albumin: 4 g/dL (ref 3.5–5.0)
Alkaline Phosphatase: 49 U/L (ref 38–126)
Anion gap: 12 (ref 5–15)
BUN: 16 mg/dL (ref 8–23)
CO2: 25 mmol/L (ref 22–32)
Calcium: 9.2 mg/dL (ref 8.9–10.3)
Chloride: 105 mmol/L (ref 98–111)
Creatinine, Ser: 0.73 mg/dL (ref 0.44–1.00)
GFR, Estimated: >60 mL/min (ref >60)
Glucose, Bld: 159 mg/dL — ABNORMAL HIGH (ref 70–99)
Potassium: 4.5 mmol/L (ref 3.5–5.1)
Sodium: 142 mmol/L (ref 135–145)
Total Bilirubin: 0.9 mg/dL (ref 0.0–1.2)
Total Protein: 7.8 g/dL (ref 6.5–8.1)

## 2023-08-22 LAB — RESP PANEL BY RT-PCR (RSV, FLU A&B, COVID)  RVPGX2
Influenza A by PCR: NEGATIVE
Influenza B by PCR: NEGATIVE
Resp Syncytial Virus by PCR: NEGATIVE
SARS Coronavirus 2 by RT PCR: NEGATIVE

## 2023-08-22 LAB — GLUCOSE, CAPILLARY: Glucose-Capillary: 156 mg/dL — ABNORMAL HIGH (ref 70–99)

## 2023-08-22 LAB — CBG MONITORING, ED
Glucose-Capillary: 137 mg/dL — ABNORMAL HIGH (ref 70–99)
Glucose-Capillary: 136 mg/dL — ABNORMAL HIGH (ref 70–99)

## 2023-08-22 LAB — LIPASE, BLOOD: Lipase: 33 U/L (ref 11–51)

## 2023-08-22 MED ORDER — ROSUVASTATIN CALCIUM 5 MG PO TABS
5.0000 mg | ORAL_TABLET | Freq: Every day | ORAL | Status: DC
  Administered 2023-08-22 – 2023-08-23 (×2): 5 mg via ORAL
  Filled 2023-08-22 (×2): qty 1

## 2023-08-22 MED ORDER — EMPAGLIFLOZIN 25 MG PO TABS
25.0000 mg | ORAL_TABLET | Freq: Every day | ORAL | Status: DC
  Administered 2023-08-22 – 2023-08-23 (×2): 25 mg via ORAL
  Filled 2023-08-22 (×2): qty 1

## 2023-08-22 MED ORDER — INSULIN ASPART 100 UNIT/ML IJ SOLN
0.0000 [IU] | Freq: Three times a day (TID) | INTRAMUSCULAR | Status: DC
  Administered 2023-08-22 – 2023-08-23 (×2): 2 [IU] via SUBCUTANEOUS
  Filled 2023-08-22: qty 0.15

## 2023-08-22 MED ORDER — GABAPENTIN 100 MG PO CAPS
100.0000 mg | ORAL_CAPSULE | Freq: Every day | ORAL | Status: DC
  Administered 2023-08-22: 100 mg via ORAL
  Filled 2023-08-22: qty 1

## 2023-08-22 MED ORDER — EMPAGLIFLOZIN-METFORMIN HCL 12.5-500 MG PO TABS
1.0000 | ORAL_TABLET | Freq: Two times a day (BID) | ORAL | Status: DC
Start: 2023-08-22 — End: 2023-08-22

## 2023-08-22 MED ORDER — PANTOPRAZOLE SODIUM 40 MG PO TBEC
40.0000 mg | DELAYED_RELEASE_TABLET | Freq: Every day | ORAL | Status: DC
  Administered 2023-08-22 – 2023-08-23 (×2): 40 mg via ORAL
  Filled 2023-08-22 (×2): qty 1

## 2023-08-22 MED ORDER — IOHEXOL 300 MG/ML  SOLN
100.0000 mL | Freq: Once | INTRAMUSCULAR | Status: AC | PRN
  Administered 2023-08-22: 100 mL via INTRAVENOUS

## 2023-08-22 MED ORDER — METFORMIN HCL 500 MG PO TABS: 500.0000 mg | ORAL_TABLET | Freq: Two times a day (BID) | ORAL | Status: DC

## 2023-08-22 MED ORDER — ONDANSETRON HCL 4 MG/2ML IJ SOLN: 4.0000 mg | Freq: Four times a day (QID) | INTRAMUSCULAR | Status: DC | PRN

## 2023-08-22 MED ORDER — DIPHENHYDRAMINE HCL 50 MG/ML IJ SOLN
25.0000 mg | Freq: Once | INTRAMUSCULAR | Status: AC
Start: 2023-08-22 — End: 2023-08-22
  Administered 2023-08-22: 25 mg via INTRAVENOUS
  Filled 2023-08-22: qty 1

## 2023-08-22 MED ORDER — SODIUM CHLORIDE 0.9 % IV BOLUS
1000.0000 mL | Freq: Once | INTRAVENOUS | Status: AC
  Administered 2023-08-22: 1000 mL via INTRAVENOUS

## 2023-08-22 MED ORDER — INSULIN ASPART 100 UNIT/ML IJ SOLN
0.0000 [IU] | Freq: Every day | INTRAMUSCULAR | Status: DC
  Filled 2023-08-22: qty 0.05

## 2023-08-22 MED ORDER — PROCHLORPERAZINE EDISYLATE 10 MG/2ML IJ SOLN
10.0000 mg | Freq: Once | INTRAMUSCULAR | Status: AC
  Administered 2023-08-22: 10 mg via INTRAVENOUS
  Filled 2023-08-22: qty 2

## 2023-08-22 MED ORDER — ONDANSETRON HCL 4 MG/2ML IJ SOLN
4.0000 mg | Freq: Once | INTRAMUSCULAR | Status: AC
Start: 2023-08-22 — End: 2023-08-22
  Administered 2023-08-22: 4 mg via INTRAVENOUS
  Filled 2023-08-22: qty 2

## 2023-08-22 MED ORDER — SODIUM CHLORIDE 0.9 % IV BOLUS
1000.0000 mL | Freq: Once | INTRAVENOUS | Status: AC
Start: 2023-08-22 — End: 2023-08-22
  Administered 2023-08-22: 1000 mL via INTRAVENOUS

## 2023-08-22 MED ORDER — ONDANSETRON HCL 4 MG/2ML IJ SOLN
4.0000 mg | Freq: Once | INTRAMUSCULAR | Status: AC
  Administered 2023-08-22: 4 mg via INTRAVENOUS
  Filled 2023-08-22: qty 2

## 2023-08-22 MED ORDER — TRAZODONE HCL 50 MG PO TABS: 50.0000 mg | ORAL_TABLET | Freq: Every evening | ORAL | Status: DC | PRN

## 2023-08-22 MED ORDER — ONDANSETRON HCL 4 MG PO TABS: 4.0000 mg | ORAL_TABLET | Freq: Four times a day (QID) | ORAL | Status: DC | PRN

## 2023-08-22 MED ORDER — ENOXAPARIN SODIUM 40 MG/0.4ML IJ SOSY
40.0000 mg | PREFILLED_SYRINGE | INTRAMUSCULAR | Status: DC
  Administered 2023-08-22: 40 mg via SUBCUTANEOUS
  Filled 2023-08-22: qty 0.4

## 2023-08-22 MED ORDER — ACETAMINOPHEN 325 MG PO TABS
650.0000 mg | ORAL_TABLET | Freq: Four times a day (QID) | ORAL | Status: DC | PRN
  Administered 2023-08-22: 650 mg via ORAL
  Filled 2023-08-22: qty 2

## 2023-08-22 MED ORDER — ACETAMINOPHEN 650 MG RE SUPP: 650.0000 mg | Freq: Four times a day (QID) | RECTAL | Status: DC | PRN

## 2023-08-22 NOTE — Evaluation (Signed)
Occupational Therapy Evaluation Patient Details Name: Barbara Golden MRN: [id number] DOB: 08-14-1935 Today's Date: 08/22/2023   History of Present Illness Patient is a 88 year old female who presented with Pre syncope with vomiting and diarrhea. Patient was admitted with weakness/dizziness. Patient is pending respiratory panel at this time. PMH: DM II, hyperlipidemia, magular degeneration, COVID.   Clinical Impression   Patient is a 88 year old female who was admitted for above. Patient reported living at home with daughter and son in law with independence for Adls at rollator level. Patient was CGA for transfer from ED stretcher to bathroom with increased time and RW. Patient needed increased A for hygiene and clothing management during session compared to baseline. Patient reported plan is to d/c home with family and HH when medically stable. Patient would continue to benefit from skilled OT services at this time while admitted and after d/c to address noted deficits in order to improve overall safety and independence in ADLs.        If plan is discharge home, recommend the following: A little help with walking and/or transfers;A little help with bathing/dressing/bathroom;Assistance with cooking/housework;Direct supervision/assist for medications management;Assist for transportation;Direct supervision/assist for financial management;Help with stairs or ramp for entrance    Functional Status Assessment  Patient has had a recent decline in their functional status and demonstrates the ability to make significant improvements in function in a reasonable and predictable amount of time.  Equipment Recommendations  None recommended by OT       Precautions / Restrictions Precautions Precautions: Fall Restrictions Weight Bearing Restrictions Per Provider Order: No      Mobility Bed Mobility Overal bed mobility: Needs Assistance Bed Mobility: Supine to Sit     Supine to sit: Supervision      General bed mobility comments: on strecher in ED        Balance Overall balance assessment: Mild deficits observed, not formally tested                 ADL either performed or assessed with clinical judgement   ADL Overall ADL's : Needs assistance/impaired Eating/Feeding: Set up;Sitting Eating/Feeding Details (indicate cue type and reason): could not read packets on meal tray Grooming: Sitting;Set up   Upper Body Bathing: Sitting;Minimal assistance   Lower Body Bathing: Sitting/lateral leans;Moderate assistance   Upper Body Dressing : Sitting;Contact guard assist   Lower Body Dressing: Sitting/lateral leans;Minimal assistance Lower Body Dressing Details (indicate cue type and reason): with patient needing help pushig down/pulling up underwear at toilet with cues. Toilet Transfer: Minimal assistance;Ambulation;Rolling walker (2 wheels) Toilet Transfer Details (indicate cue type and reason): with increased time. patient reporting using rollator at home Toileting- Clothing Manipulation and Hygiene: Minimal assistance;Sitting/lateral lean               Vision Baseline Vision/History: 6 Macular Degeneration Additional Comments: had hard time seeing items on tray without cues.            Pertinent Vitals/Pain Pain Assessment Pain Assessment: No/denies pain     Extremity/Trunk Assessment Upper Extremity Assessment Upper Extremity Assessment: Generalized weakness   Lower Extremity Assessment Lower Extremity Assessment: Defer to PT evaluation   Cervical / Trunk Assessment Cervical / Trunk Assessment: Kyphotic   Communication Communication Communication: No apparent difficulties   Cognition Arousal: Alert Behavior During Therapy: Flat affect Overall Cognitive Status: No family/caregiver present to determine baseline cognitive functioning             General Comments: patient was  plesant and cooperative slow to answer questions. eval was completed in  hallway in ED not appropraite for indepth cog assessment.     General Comments  patient denied dizziness during session. patient on airborne precautions in ED            Home Living Family/patient expects to be discharged to:: Private residence Living Arrangements: Children;Other relatives (daughter and son in law) Available Help at Discharge: Family;Available 24 hours/day Type of Home: House Home Access: Stairs to enter [employer] of Steps: 4 at front, 1 step in back Entrance Stairs-Rails: Right;Left;Can reach both Home Layout: One level     Bathroom Shower/Tub: Chief Strategy Officer: Standard     Home Equipment: Cane - single point;Shower seat;Hand held Stage manager (4 wheels)          Prior Functioning/Environment Prior Level of Function : Independent/Modified Independent             Mobility Comments: in house uses Rollator, but if its tight she uses SPC, uses rascals in shopping stores like [employer]. one fall in last 6 months due to the dogs tripping her up. otherwise no falls ADLs Comments: does her own bathing/dressing        OT Problem List: Decreased activity tolerance;Impaired balance (sitting and/or standing);Decreased coordination;Decreased safety awareness;Decreased knowledge of precautions;Decreased knowledge of use of DME or AE;Cardiopulmonary status limiting activity      OT Treatment/Interventions: Energy conservation;Self-care/ADL training;Therapeutic activities;Patient/family education;Balance training;Therapeutic exercise;DME and/or AE instruction    OT Goals(Current goals can be found in the care plan section) Acute Rehab OT Goals Patient Stated Goal: to ge better OT Goal Formulation: With patient/family Time For Goal Achievement: 09/05/23 Potential to Achieve Goals: Fair  OT Frequency: Min 1X/week       AM-PAC OT "6 Clicks" Daily Activity     Outcome Measure Help from another person eating meals?: A  Little Help from another person taking care of personal grooming?: A Little Help from another person toileting, which includes using toliet, bedpan, or urinal?: A Lot Help from another person bathing (including washing, rinsing, drying)?: A Lot Help from another person to put on and taking off regular upper body clothing?: A Little Help from another person to put on and taking off regular lower body clothing?: A Lot 6 Click Score: 15   End of Session Equipment Utilized During Treatment: Rolling walker (2 wheels) Nurse Communication: Other (comment) (patients status during session.)  Activity Tolerance: Patient limited by fatigue Patient left: in bed;with call bell/phone within reach (EOB in hallway strecther in ED with nurse present.)  OT Visit Diagnosis: Unsteadiness on feet (R26.81);Other abnormalities of gait and mobility (R26.89);History of falling (Z91.81);Muscle weakness (generalized) (M62.81)                Time: 6962-9528 OT Time Calculation (min): 23 min Charges:  OT General Charges $OT Visit: 1 Visit OT Evaluation $OT Eval Low Complexity: 1 Low OT Treatments $Self Care/Home Management : 8-22 mins  Rosalio Loud, MS Acute Rehabilitation Department Office# [phone]   Selinda Flavin 08/22/2023, 1:37 PM

## 2023-08-22 NOTE — ED Notes (Signed)
.ED TO INPATIENT HANDOFF REPORT  Name/Age/Gender Barbara Golden 88 y.o. female  Code Status    Code Status Orders  (From admission, onward)           Start     Ordered   08/22/23 0757  Full code  Continuous       Question:  By:  Answer:  Consent: discussion documented in EHR   08/22/23 0759           Code Status History     Date Active Date Inactive Code Status Order ID Comments User Context   04/14/2023 0016 04/15/2023 2037 Full Code [id number]  Angie Fava, DO ED   09/11/2020 1002 09/11/2020 1733 Full Code [id number]  Doylene Bode, NP Inpatient   09/13/2019 2212 09/14/2019 2019 Full Code [id number]  Versie Starks, DO ED       Home/SNF/Other Home  Chief Complaint Syncope [R55]  Level of Care/Admitting Diagnosis ED Disposition     ED Disposition  Admit   Condition  --   Comment  Hospital Area: [facility] [[id number]]  Level of Care: Telemetry [5]  Admit to tele based on following criteria: Eval of Syncope  May place patient in observation at [facility] or [facility] if equivalent level of care is available:: No  Covid Evaluation: Asymptomatic - no recent exposure (last 10 days) testing not required  Diagnosis: Syncope [206001]  Admitting Physician: Eduard Clos [phone]  Attending Physician: Eduard Clos [phone]          Medical History Past Medical History:  Diagnosis Date   Allergy    Cancer (HCC)    endometrial   Cataract    Diabetes mellitus without complication (HCC)    type 2   GERD (gastroesophageal reflux disease)    High cholesterol    Jaundice    as a child   Macular degeneration    Nuclear sclerotic cataract of both eyes 09/05/2020   Varicose veins of lower extremity    right leg    Allergies Allergies  Allergen Reactions   Ammonia     Unknown rxn.   Codeine Other (See Comments)    Causes lip burning and hyperactivity    IV Location/Drains/Wounds Patient Lines/Drains/Airways  Status     Active Line/Drains/Airways     Name Placement date Placement time Site Days   Peripheral IV 08/21/23 18 G 1" Anterior;Left;Proximal Forearm 08/21/23  2330  Forearm  1            Labs/Imaging Results for orders placed or performed during the hospital encounter of 08/21/23 (from the past 48 hours)  CBC     Status: Abnormal   Collection Time: 08/22/23 12:38 AM  Result Value Ref Range   WBC 14.7 (H) 4.0 - 10.5 K/uL   RBC 4.57 3.87 - 5.11 MIL/uL   Hemoglobin 13.8 12.0 - 15.0 g/dL   HCT 96.2 95.2 - 84.1 %   MCV 95.2 80.0 - 100.0 fL   MCH 30.2 26.0 - 34.0 pg   MCHC 31.7 30.0 - 36.0 g/dL   RDW 32.4 40.1 - 02.7 %   Platelets 253 150 - 400 K/uL   nRBC 0.0 0.0 - 0.2 %    Comment: Performed at [facility], [address], [place], [place] [address]  Comprehensive metabolic panel     Status: Abnormal   Collection Time: 08/22/23  1:31 AM  Result Value Ref Range   Sodium 142 135 -  145 mmol/L   Potassium 4.5 3.5 - 5.1 mmol/L   Chloride 105 98 - 111 mmol/L   CO2 25 22 - 32 mmol/L   Glucose, Bld 159 (H) 70 - 99 mg/dL    Comment: Glucose reference range applies only to samples taken after fasting for at least 8 hours.   BUN 16 8 - 23 mg/dL   Creatinine, Ser 1.61 0.44 - 1.00 mg/dL   Calcium 9.2 8.9 - 09.6 mg/dL   Total Protein 7.8 6.5 - 8.1 g/dL   Albumin 4.0 3.5 - 5.0 g/dL   AST 15 15 - 41 U/L   ALT 13 0 - 44 U/L   Alkaline Phosphatase 49 38 - 126 U/L   Total Bilirubin 0.9 0.0 - 1.2 mg/dL   GFR, Estimated >04 >54 mL/min    Comment: (NOTE) Calculated using the CKD-EPI Creatinine Equation (2021)    Anion gap 12 5 - 15    Comment: Performed at [facility], [address], [place], [place] [address]  Lipase, blood     Status: None   Collection Time: 08/22/23  1:31 AM  Result Value Ref Range   Lipase 33 11 - 51 U/L    Comment: Performed at [facility], [address], [place], [place] [address]  CBG monitoring,  ED     Status: Abnormal   Collection Time: 08/22/23 11:26 AM  Result Value Ref Range   Glucose-Capillary 137 (H) 70 - 99 mg/dL    Comment: Glucose reference range applies only to samples taken after fasting for at least 8 hours.  Resp panel by RT-PCR (RSV, Flu A&B, Covid) Anterior Nasal Swab     Status: None   Collection Time: 08/22/23 12:24 PM   Specimen: Anterior Nasal Swab  Result Value Ref Range   SARS Coronavirus 2 by RT PCR NEGATIVE NEGATIVE    Comment: (NOTE) SARS-CoV-2 target nucleic acids are NOT DETECTED.  The SARS-CoV-2 RNA is generally detectable in upper respiratory specimens during the acute phase of infection. The lowest concentration of SARS-CoV-2 viral copies this assay can detect is 138 copies/mL. A negative result does not preclude SARS-Cov-2 infection and should not be used as the sole basis for treatment or other patient management decisions. A negative result may occur with  improper specimen collection/handling, submission of specimen other than nasopharyngeal swab, presence of viral mutation(s) within the areas targeted by this assay, and inadequate number of viral copies(<138 copies/mL). A negative result must be combined with clinical observations, patient history, and epidemiological information. The expected result is Negative.  Fact Sheet for Patients:  BloggerCourse.com  Fact Sheet for Healthcare Providers:  SeriousBroker.it  This test is no t yet approved or cleared by the [place] FDA and  has been authorized for detection and/or diagnosis of SARS-CoV-2 by FDA under an Emergency Use Authorization (EUA). This EUA will remain  in effect (meaning this test can be used) for the duration of the COVID-19 declaration under Section 564(b)(1) of the Act, 21 U.S.C.section 360bbb-3(b)(1), unless the authorization is terminated  or revoked sooner.       Influenza A by PCR NEGATIVE NEGATIVE   Influenza  B by PCR NEGATIVE NEGATIVE    Comment: (NOTE) The Xpert Xpress SARS-CoV-2/FLU/RSV plus assay is intended as an aid in the diagnosis of influenza from Nasopharyngeal swab specimens and should not be used as a sole basis for treatment. Nasal washings and aspirates are unacceptable for Xpert Xpress SARS-CoV-2/FLU/RSV testing.  Fact Sheet for  Patients: BloggerCourse.com  Fact Sheet for Healthcare Providers: SeriousBroker.it  This test is not yet approved or cleared by the [place] FDA and has been authorized for detection and/or diagnosis of SARS-CoV-2 by FDA under an Emergency Use Authorization (EUA). This EUA will remain in effect (meaning this test can be used) for the duration of the COVID-19 declaration under Section 564(b)(1) of the Act, 21 U.S.C. section 360bbb-3(b)(1), unless the authorization is terminated or revoked.     Resp Syncytial Virus by PCR NEGATIVE NEGATIVE    Comment: (NOTE) Fact Sheet for Patients: BloggerCourse.com  Fact Sheet for Healthcare Providers: SeriousBroker.it  This test is not yet approved or cleared by the [place] FDA and has been authorized for detection and/or diagnosis of SARS-CoV-2 by FDA under an Emergency Use Authorization (EUA). This EUA will remain in effect (meaning this test can be used) for the duration of the COVID-19 declaration under Section 564(b)(1) of the Act, 21 U.S.C. section 360bbb-3(b)(1), unless the authorization is terminated or revoked.  Performed at [facility], [address] [place], [place] [address]   CBG monitoring, ED     Status: Abnormal   Collection Time: 08/22/23  7:19 PM  Result Value Ref Range   Glucose-Capillary 136 (H) 70 - 99 mg/dL    Comment: Glucose reference range applies only to samples taken after fasting for at least 8 hours.   CT ABDOMEN PELVIS W CONTRAST Result  Date: 08/22/2023 CLINICAL DATA:  Bowel obstruction suspected EXAM: CT ABDOMEN AND PELVIS WITH CONTRAST TECHNIQUE: Multidetector CT imaging of the abdomen and pelvis was performed using the standard protocol following bolus administration of intravenous contrast. RADIATION DOSE REDUCTION: This exam was performed according to the departmental dose-optimization program which includes automated exposure control, adjustment of the mA and/or kV according to patient size and/or use of iterative reconstruction technique. CONTRAST:  OMNIPAQUE IOHEXOL 300 MG/ML  SOLN COMPARISON:  None Available. FINDINGS: Lower chest:  Coronary atherosclerosis. Hepatobiliary: Suspect hepatic steatosis, but a postcontrast scan. No focal liver abnormality.No evidence of biliary obstruction or stone. Pancreas: Unremarkable. Spleen: Unremarkable. Adrenals/Urinary Tract: Negative adrenals. No hydronephrosis or stone. Unremarkable bladder. Stomach/Bowel:  No obstruction. No appendicitis. Vascular/Lymphatic: No acute vascular abnormality. Severe atherosclerosis with advanced narrowing of the proximal SMA and celiac. Diffuse narrowing and attenuation of the iliacs. No mass or adenopathy. Reproductive:Hysterectomy. Other: No ascites or pneumoperitoneum. Small fatty umbilical hernia. Musculoskeletal: No acute abnormalities. Ordinary and generalized degeneration. IMPRESSION: 1. Negative for bowel obstruction or visible inflammation. 2. Advanced atherosclerosis including the coronary arteries. 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 Electronically Signed   By: Tiburcio Pea M.D.   On: 08/22/2023 05:03    Pending Labs Unresulted Labs (From admission, onward)     Start     Ordered   08/23/23 0500  Basic metabolic panel  Tomorrow morning,   R        08/22/23 0759   08/23/23 0500  CBC  Tomorrow morning,   R        08/22/23 0759   08/22/23 0759  Norovirus group 1 & 2 by PCR, stool  (Norovirus group 1 & 2 by PCR, stool panel)   Once,   R        08/22/23 0759            Vitals/Pain Today's Vitals   08/22/23 0454 08/22/23 0950 08/22/23 1442 08/22/23 1921  BP:  (!) 148/61 (!) 123/42 (!) 120/53  Pulse:  95 93 93  Resp:  16 16 20   Temp:  99 F (37.2 C) 98.8 F (37.1 C) 100 F (37.8 C)  TempSrc:  Oral Oral Oral  SpO2:  91% 90% 91%  Weight:      Height:      PainSc: 7        Isolation Precautions Airborne and Contact precautions  Medications Medications  rosuvastatin (CRESTOR) tablet 5 mg (5 mg Oral Given 08/22/23 1456)  pantoprazole (PROTONIX) EC tablet 40 mg (40 mg Oral Given 08/22/23 1455)  gabapentin (NEURONTIN) capsule 100 mg (has no administration in time range)  enoxaparin (LOVENOX) injection 40 mg (has no administration in time range)  insulin aspart (novoLOG) injection 0-15 Units ( Subcutaneous Not Given 08/22/23 1924)  insulin aspart (novoLOG) injection 0-5 Units (has no administration in time range)  acetaminophen (TYLENOL) tablet 650 mg (650 mg Oral Given 08/22/23 1926)    Or  acetaminophen (TYLENOL) suppository 650 mg ( Rectal See Alternative 08/22/23 1926)  ondansetron (ZOFRAN) tablet 4 mg (has no administration in time range)    Or  ondansetron (ZOFRAN) injection 4 mg (has no administration in time range)  traZODone (DESYREL) tablet 50 mg (has no administration in time range)  empagliflozin (JARDIANCE) tablet 25 mg (25 mg Oral Given 08/22/23 1456)    And  metFORMIN (GLUCOPHAGE) tablet 500 mg (has no administration in time range)  sodium chloride 0.9 % bolus 1,000 mL (0 mLs Intravenous Stopped 08/22/23 0252)  ondansetron (ZOFRAN) injection 4 mg (4 mg Intravenous Given 08/22/23 0040)  ondansetron (ZOFRAN) injection 4 mg (4 mg Intravenous Given 08/22/23 0217)  sodium chloride 0.9 % bolus 1,000 mL (0 mLs Intravenous Stopped 08/22/23 0500)  diphenhydrAMINE (BENADRYL) injection 25 mg (25 mg Intravenous Given 08/22/23 0349)  prochlorperazine (COMPAZINE) injection 10 mg (10 mg Intravenous Given 08/22/23 0349)   iohexol (OMNIPAQUE) 300 MG/ML solution 100 mL (100 mLs Intravenous Contrast Given 08/22/23 0406)    Mobility walks

## 2023-08-22 NOTE — Plan of Care (Signed)

## 2023-08-22 NOTE — H&P (Signed)
History and Physical  Barbara Golden ZOX:[id number] DOB: 20-May-1936 DOA: 08/21/2023  PCP: [place], Betty G, MD   Chief Complaint: Vomiting, diarrhea  HPI: Barbara Golden is a 88 y.o. female with medical history significant for 2 diabetes, GERD, macular degeneration being admitted to the hospital with presyncope in the setting of sudden onset vomiting and diarrhea.  States that her daughter was recently sick with the same symptoms, last evening she had sudden onset of abdominal cramping, multiple bouts of vomiting, and diarrhea x 1.  She denies any fevers, or pain of any kind.  After she vomited multiple times, she became very weak, extremely lightheaded, was unable to stand and crawled back to her bedroom, from where she called EMS.  She denies any loss of consciousness, or falling.  Workup in the emergency department was relatively benign as noted below, however she had persistent severe nausea, dizziness with attempts to ambulate.  As such, hospitalist was contacted for admission.  Review of Systems: Please see HPI for pertinent positives and negatives. A complete 10 system review of systems are otherwise negative.  Past Medical History:  Diagnosis Date   Allergy    Cancer (HCC)    endometrial   Cataract    Diabetes mellitus without complication (HCC)    type 2   GERD (gastroesophageal reflux disease)    High cholesterol    Jaundice    as a child   Macular degeneration    Nuclear sclerotic cataract of both eyes 09/05/2020   Varicose veins of lower extremity    right leg   Past Surgical History:  Procedure Laterality Date   ROBOTIC ASSISTED TOTAL HYSTERECTOMY WITH BILATERAL SALPINGO OOPHERECTOMY Bilateral 09/11/2020   Procedure: XI ROBOTIC ASSISTED TOTAL HYSTERECTOMY WITH BILATERAL SALPINGO OOPHORECTOMY;  Surgeon: Adolphus Birchwood, MD;  Location: [facility];  Service: Gynecology;  Laterality: Bilateral;   SENTINEL NODE BIOPSY N/A 09/11/2020   Procedure: SENTINEL NODE BIOPSY;  Surgeon: Adolphus Birchwood, MD;  Location: [facility];  Service: Gynecology;  Laterality: N/A;   TUBAL LIGATION Bilateral    Social History:  reports that she quit smoking about 14 years ago. Her smoking use included cigarettes. She has never used smokeless tobacco. She reports that she does not drink alcohol and does not use drugs.  Allergies  Allergen Reactions   Ammonia    Codeine Other (See Comments)    Causes lip burning and hyperactivity    Family History  Problem Relation Age of Onset   Colon cancer Mother    Cancer Mother        colon/stomach   Heart disease Father    Heart attack Sister    Cancer Nephew        bile duct     Prior to Admission medications   Medication Sig Start Date End Date Taking? Authorizing Provider  albuterol (VENTOLIN HFA) 108 (90 Base) MCG/ACT inhaler TAKE 2 PUFFS BY MOUTH EVERY 6 HOURS AS NEEDED FOR WHEEZE OR SHORTNESS OF BREATH 08/03/23   [place], Betty G, MD  Continuous Blood Gluc Receiver (FREESTYLE LIBRE 14 DAY READER) DEVI 1 Units by Does not apply route 2 (two) times daily. H35.30, E11.65 10/07/19   Reed, Tiffany L, DO  Continuous Glucose Sensor (FREESTYLE LIBRE 14 DAY SENSOR) MISC APPLY 1 SENSOR EVERY 14 DAYS 06/12/23   [place], Betty G, MD  Dulaglutide (TRULICITY) 1.5 MG/0.5ML SOAJ INJECT 1.5 MG INTO THE SKIN ONCE A WEEK. 05/12/23   [place], Betty G, MD  Elastic Bandages & Supports (  MEDICAL COMPRESSION THIGH HIGH) MISC 1 each by Does not apply route daily. 04/12/20   Wieters, Hallie C, PA-C  Empagliflozin-metFORMIN HCl 12.5-500 MG TABS Take 1 tablet by mouth 2 (two) times daily with a meal. 07/28/23   [place], Betty G, MD  famotidine (PEPCID) 10 MG tablet Take 10 mg by mouth as needed for heartburn or indigestion.    [provider]  fexofenadine (ALLEGRA ALLERGY) 60 MG tablet 1 po qd    [provider]  gabapentin (NEURONTIN) 100 MG capsule Take 1 capsule (100 mg total) by mouth at bedtime. 07/28/23   [place], Betty G, MD  Multiple Vitamins-Minerals  (PRESERVISION AREDS) CAPS Take 1 capsule by mouth daily. 08/21/20   [provider]  pantoprazole (PROTONIX) 40 MG tablet Take 1 tablet (40 mg total) by mouth daily before breakfast. 07/06/23   [place], Betty G, MD  rosuvastatin (CRESTOR) 5 MG tablet Take 5 mg by mouth daily. 08/17/20   [provider]  cetirizine (ZYRTEC) 10 MG tablet Take 10 mg by mouth daily as needed for allergies.  09/13/19  [provider]    Physical Exam: BP (!) 145/62 (BP Location: Right Arm)   Pulse 82   Temp 98.1 F (36.7 C) (Oral)   Resp 18   Ht 5\' 5"  (1.651 m)   Wt 73.5 kg   LMP 07/21/1998 (Approximate)   SpO2 98%   BMI 26.96 kg/m  General:  Alert, oriented, calm, in no acute distress, elderly female resting on a stretcher in the emergency department, she appears her stated age.  She is a good historian.  Mucous membranes look dry. Cardiovascular: RRR, no murmurs or rubs, no peripheral edema  Respiratory: clear to auscultation bilaterally, no wheezes, no crackles  Abdomen: soft, nontender, nondistended, normal bowel tones heard  Skin: dry, no rashes  Musculoskeletal: no joint effusions, normal range of motion  Psychiatric: appropriate affect, normal speech  Neurologic: extraocular muscles intact, clear speech, moving all extremities with intact sensorium         Labs on Admission:  Basic Metabolic Panel: Recent Labs  Lab 08/22/23 0131  NA 142  K 4.5  CL 105  CO2 25  GLUCOSE 159*  BUN 16  CREATININE 0.73  CALCIUM 9.2   Liver Function Tests: Recent Labs  Lab 08/22/23 0131  AST 15  ALT 13  ALKPHOS 49  BILITOT 0.9  PROT 7.8  ALBUMIN 4.0   Recent Labs  Lab 08/22/23 0131  LIPASE 33   No results for input(s): "AMMONIA" in the last 168 hours. CBC: Recent Labs  Lab 08/22/23 0038  WBC 14.7*  HGB 13.8  HCT 43.5  MCV 95.2  PLT 253   Cardiac Enzymes: No results for input(s): "CKTOTAL", "CKMB", "CKMBINDEX", "TROPONINI" in the last 168 hours. BNP (last 3  results) No results for input(s): "BNP" in the last 8760 hours.  ProBNP (last 3 results) No results for input(s): "PROBNP" in the last 8760 hours.  CBG: No results for input(s): "GLUCAP" in the last 168 hours.  Radiological Exams on Admission: CT ABDOMEN PELVIS W CONTRAST Result Date: 08/22/2023 CLINICAL DATA:  Bowel obstruction suspected EXAM: CT ABDOMEN AND PELVIS WITH CONTRAST TECHNIQUE: Multidetector CT imaging of the abdomen and pelvis was performed using the standard protocol following bolus administration of intravenous contrast. RADIATION DOSE REDUCTION: This exam was performed according to the departmental dose-optimization program which includes automated exposure control, adjustment of the mA and/or kV according to patient size and/or use of iterative reconstruction technique.  CONTRAST:  OMNIPAQUE IOHEXOL 300 MG/ML  SOLN COMPARISON:  None Available. FINDINGS: Lower chest:  Coronary atherosclerosis. Hepatobiliary: Suspect hepatic steatosis, but a postcontrast scan. No focal liver abnormality.No evidence of biliary obstruction or stone. Pancreas: Unremarkable. Spleen: Unremarkable. Adrenals/Urinary Tract: Negative adrenals. No hydronephrosis or stone. Unremarkable bladder. Stomach/Bowel:  No obstruction. No appendicitis. Vascular/Lymphatic: No acute vascular abnormality. Severe atherosclerosis with advanced narrowing of the proximal SMA and celiac. Diffuse narrowing and attenuation of the iliacs. No mass or adenopathy. Reproductive:Hysterectomy. Other: No ascites or pneumoperitoneum. Small fatty umbilical hernia. Musculoskeletal: No acute abnormalities. Ordinary and generalized degeneration. IMPRESSION: 1. Negative for bowel obstruction or visible inflammation. 2. Advanced atherosclerosis including the coronary arteries. 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 Electronically Signed   By: Tiburcio Pea M.D.   On: 08/22/2023 05:03   Assessment/Plan Barbara Golden is a 88 y.o.  female with medical history significant for 2 diabetes, GERD, macular degeneration being admitted to the hospital with presyncope in the setting of sudden onset vomiting and diarrhea.   Weakness, dizziness, presyncope-in the setting of dehydration due to persistent vomiting and diarrhea.  I suspect she has a viral illness causing the symptoms.  No report of syncope, loss of consciousness, or fall. -Observation admission -She received a total of 2 L normal saline, blood pressure is elevated -Check COVID, flu, RSV panel -Check norovirus -Encourage p.o. intake as tolerated -PT/OT consult -Check orthostatics this morning  Type 2 diabetes-not well-controlled, last hemoglobin A1c 8.3 on 07/28/2023 -Carb controlled diet -Continue home oral medications -Moderate dose sliding scale  Hyperlipidemia-Crestor  DVT prophylaxis: Lovenox     Code Status: Full Code  Consults called: None  Admission status: Observation  Time spent: 48 minutes  Amiera Herzberg Sharlette Dense MD [facility] Pager [phone]  If 7PM-7AM, please contact night-coverage [email or web address] Password [email or web address]  08/22/2023, 8:05 AM

## 2023-08-22 NOTE — ED Provider Notes (Signed)
[facility]PT [facility] Emergency Department Provider Note MRN:  [id number]  Arrival date & time: 08/22/23     Chief Complaint   Diarrhea History of Present Illness   Barbara Golden is a 88 y.o. year-old female with a history of diabetes, endometrial cancer presenting to the ED with chief complaint of diarrhea.  At about 9 PM patient began experiencing nausea vomiting and diarrhea.  Felt generally unwell, some chills and malaise.  Denies any abdominal pain.  She became very weak and decided to lay down on the floor.  Did not fall, did not hit her head, denies any pain whatsoever.  She was feeling too weak to get up and so EMS was called.  Denies numbness or weakness to the arms or legs.  Review of Systems  A thorough review of systems was obtained and all systems are negative except as noted in the HPI and PMH.   Patient's Health History    Past Medical History:  Diagnosis Date   Allergy    Cancer [facility])    endometrial   Cataract    Diabetes mellitus without complication (HCC)    type 2   GERD (gastroesophageal reflux disease)    High cholesterol    Jaundice    as a child   Macular degeneration    Nuclear sclerotic cataract of both eyes 09/05/2020   Varicose veins of lower extremity    right leg    Past Surgical History:  Procedure Laterality Date   ROBOTIC ASSISTED TOTAL HYSTERECTOMY WITH BILATERAL SALPINGO OOPHERECTOMY Bilateral 09/11/2020   Procedure: XI ROBOTIC ASSISTED TOTAL HYSTERECTOMY WITH BILATERAL SALPINGO OOPHORECTOMY;  Surgeon: Adolphus Birchwood, MD;  Location: [facility];  Service: Gynecology;  Laterality: Bilateral;   SENTINEL NODE BIOPSY N/A 09/11/2020   Procedure: SENTINEL NODE BIOPSY;  Surgeon: Adolphus Birchwood, MD;  Location: [facility];  Service: Gynecology;  Laterality: N/A;   TUBAL LIGATION Bilateral     Family History  Problem Relation Age of Onset   Colon cancer Mother    Cancer Mother        colon/stomach   Heart disease Father    Heart attack Sister     Cancer Nephew        bile duct    Social History   Socioeconomic History   Marital status: Widowed    Spouse name: Not on file   Number of children: 1   Years of education: Not on file   Highest education level: Not on file  Occupational History   Occupation: retired  Tobacco Use   Smoking status: Former    Current packs/day: 0.00    Types: Cigarettes    Quit date: 03/26/2009    Years since quitting: 14.4   Smokeless tobacco: Never  Vaping Use   Vaping status: Never Used  Substance and Sexual Activity   Alcohol use: No   Drug use: No   Sexual activity: Not Currently  Other Topics Concern   Not on file  Social History Narrative   3 Step Daughters/lives with Daughter(Pam Bouwer) and son-in-law.   Social Drivers of Corporate investment banker Strain: Low Risk  (03/03/2023)   Overall Financial Resource Strain (CARDIA)    Difficulty of Paying Living Expenses: Not hard at all  Food Insecurity: No Food Insecurity (04/14/2023)   Hunger Vital Sign    Worried About Running Out of Food in the Last Year: Never true    Ran Out of Food in the Last Year: Never true  Transportation Needs: No Transportation Needs (04/14/2023)   PRAPARE - Administrator, Civil Service (Medical): No    Lack of Transportation (Non-Medical): No  Physical Activity: Insufficiently Active (03/03/2023)   Exercise Vital Sign    Days of Exercise per Week: 7 days    Minutes of Exercise per Session: 10 min  Stress: No Stress Concern Present (03/03/2023)   [employer] of Occupational Health - Occupational Stress Questionnaire    Feeling of Stress : Not at all  Social Connections: Not on file  Intimate Partner Violence: Not At Risk (04/14/2023)   Humiliation, Afraid, Rape, and Kick questionnaire    Fear of Current or Ex-Partner: No    Emotionally Abused: No    Physically Abused: No    Sexually Abused: No     Physical Exam   Vitals:   08/22/23 0014 08/22/23 0451  BP: (!) 146/60 (!) 145/62   Pulse: 84 82  Resp: 18 18  Temp: 98 F (36.7 C) 98.1 F (36.7 C)  SpO2: 96% 98%    CONSTITUTIONAL: Well-appearing, NAD NEURO/PSYCH:  Alert and oriented x 3, no focal deficits EYES:  eyes equal and reactive ENT/NECK:  no LAD, no JVD CARDIO: Regular rate, well-perfused, normal S1 and S2 PULM:  CTAB no wheezing or rhonchi GI/GU:  non-distended, non-tender MSK/SPINE:  No gross deformities, no edema SKIN:  no rash, atraumatic   *Additional and/or pertinent findings included in MDM below  Diagnostic and Interventional Summary    EKG Interpretation Date/Time:    Ventricular Rate:    PR Interval:    QRS Duration:    QT Interval:    QTC Calculation:   R Axis:      Text Interpretation:         Labs Reviewed  CBC - Abnormal; Notable for the following components:      Result Value   WBC 14.7 (*)    All other components within normal limits  COMPREHENSIVE METABOLIC PANEL - Abnormal; Notable for the following components:   Glucose, Bld 159 (*)    All other components within normal limits  LIPASE, BLOOD    CT ABDOMEN PELVIS W CONTRAST  Final Result      Medications  sodium chloride 0.9 % bolus 1,000 mL (0 mLs Intravenous Stopped 08/22/23 0252)  ondansetron (ZOFRAN) injection 4 mg (4 mg Intravenous Given 08/22/23 0040)  ondansetron (ZOFRAN) injection 4 mg (4 mg Intravenous Given 08/22/23 0217)  sodium chloride 0.9 % bolus 1,000 mL (1,000 mLs Intravenous New Bag/Given 08/22/23 0350)  diphenhydrAMINE (BENADRYL) injection 25 mg (25 mg Intravenous Given 08/22/23 0349)  prochlorperazine (COMPAZINE) injection 10 mg (10 mg Intravenous Given 08/22/23 0349)  iohexol (OMNIPAQUE) 300 MG/ML solution 100 mL (100 mLs Intravenous Contrast Given 08/22/23 0406)     Procedures  /  Critical Care Procedures  ED Course and Medical Decision Making  Initial Impression and Ddx Suspect gastroenteritis causing dehydration, malaise, fatigue.  Possibly norovirus.  Abdomen is completely soft and nontender  with no rebound guarding or rigidity.  Will rehydrate, check labs, reassess.  Currently no indication for advanced imaging.  Past medical/surgical history that increases complexity of ED encounter: History of cancer  Interpretation of Diagnostics I personally reviewed the EKG and my interpretation is as follows: Sinus rhythm  Labs reassuring with no significant blood count or electrolyte disturbance, CT imaging unremarkable  Patient Reassessment and Ultimate Disposition/Management     Persistent nausea vomiting, dizziness when standing, admitted to medicine.  Patient management required discussion  with the following services or consulting groups:  Hospitalist Service  Complexity of Problems Addressed Acute illness or injury that poses threat of life of bodily function  Additional Data Reviewed and Analyzed Further history obtained from: Further history from spouse/family member  Additional Factors Impacting ED Encounter Risk Consideration of hospitalization  Elmer Sow. Pilar Plate, MD [facility] Emergency Medicine [facility] [email or web address]  Final Clinical Impressions(s) / ED Diagnoses     ICD-10-CM   1. Syncope, unspecified syncope type  R55     2. Nausea vomiting and diarrhea  R11.2    R19.7       ED Discharge Orders     None        Discharge Instructions Discussed with and Provided to Patient:   Discharge Instructions   None      Sabas Sous, MD 08/22/23 [phone]

## 2023-08-23 DIAGNOSIS — R55 Syncope and collapse: Secondary | ICD-10-CM | POA: Diagnosis not present

## 2023-08-23 LAB — CBC
HCT: 42.4 % (ref 36.0–46.0)
Hemoglobin: 13.4 g/dL (ref 12.0–15.0)
MCH: 30.2 pg (ref 26.0–34.0)
MCHC: 31.6 g/dL (ref 30.0–36.0)
MCV: 95.5 fL (ref 80.0–100.0)
Platelets: 289 10*3/uL (ref 150–400)
RBC: 4.44 MIL/uL (ref 3.87–5.11)
RDW: 13.6 % (ref 11.5–15.5)
WBC: 7.1 10*3/uL (ref 4.0–10.5)
nRBC: 0 % (ref 0.0–0.2)

## 2023-08-23 LAB — BASIC METABOLIC PANEL
Anion gap: 7 (ref 5–15)
BUN: 14 mg/dL (ref 8–23)
CO2: 27 mmol/L (ref 22–32)
Calcium: 8.1 mg/dL — ABNORMAL LOW (ref 8.9–10.3)
Chloride: 104 mmol/L (ref 98–111)
Creatinine, Ser: 0.92 mg/dL (ref 0.44–1.00)
GFR, Estimated: >60 mL/min (ref >60)
Glucose, Bld: 133 mg/dL — ABNORMAL HIGH (ref 70–99)
Potassium: 3.9 mmol/L (ref 3.5–5.1)
Sodium: 138 mmol/L (ref 135–145)

## 2023-08-23 LAB — GLUCOSE, CAPILLARY
Glucose-Capillary: 142 mg/dL — ABNORMAL HIGH (ref 70–99)
Glucose-Capillary: 92 mg/dL (ref 70–99)

## 2023-08-23 NOTE — Discharge Instructions (Signed)
Follow with [place], Betty G, MD in 5-7 days  Please get a complete blood count and chemistry panel checked by your Primary MD at your next visit, and again as instructed by your Primary MD. Please get your medications reviewed and adjusted by your Primary MD.  Please request your Primary MD to go over all Hospital Tests and Procedure/Radiological results at the follow up, please get all Hospital records sent to your Prim MD by signing hospital release before you go home.  In some cases, there will be blood work, cultures and biopsy results pending at the time of your discharge. Please request that your primary care M.D. goes through all the records of your hospital data and follows up on these results.  If you had Pneumonia of Lung problems at the Hospital: Please get a 2 view Chest X ray done in 6-8 weeks after hospital discharge or sooner if instructed by your Primary MD.  If you have Congestive Heart Failure: Please call your Cardiologist or Primary MD anytime you have any of the following symptoms:  1) 3 pound weight gain in 24 hours or 5 pounds in 1 week  2) shortness of breath, with or without a dry hacking cough  3) swelling in the hands, feet or stomach  4) if you have to sleep on extra pillows at night in order to breathe  Follow cardiac low salt diet and 1.5 lit/day fluid restriction.  If you have diabetes Accuchecks 4 times/day, Once in AM empty stomach and then before each meal. Log in all results and show them to your primary doctor at your next visit. If any glucose reading is under 80 or above 300 call your primary MD immediately.  If you have Seizure/Convulsions/Epilepsy: Please do not drive, operate heavy machinery, participate in activities at heights or participate in high speed sports until you have seen by Primary MD or a Neurologist and advised to do so again. Per [facility] statutes, patients with seizures are not allowed to drive until they have been  seizure-free for six months.  Use caution when using heavy equipment or power tools. Avoid working on ladders or at heights. Take showers instead of baths. Ensure the water temperature is not too high on the home water heater. Do not go swimming alone. Do not lock yourself in a room alone (i.e. bathroom). When caring for infants or small children, sit down when holding, feeding, or changing them to minimize risk of injury to the child in the event you have a seizure. Maintain good sleep hygiene. Avoid alcohol.   If you had Gastrointestinal Bleeding: Please ask your Primary MD to check a complete blood count within one week of discharge or at your next visit. Your endoscopic/colonoscopic biopsies that are pending at the time of discharge, will also need to followed by your Primary MD.  Get Medicines reviewed and adjusted. Please take all your medications with you for your next visit with your Primary MD  Please request your Primary MD to go over all hospital tests and procedure/radiological results at the follow up, please ask your Primary MD to get all Hospital records sent to his/her office.  If you experience worsening of your admission symptoms, develop shortness of breath, life threatening emergency, suicidal or homicidal thoughts you must seek medical attention immediately by calling 911 or calling your MD immediately  if symptoms less severe.  You must read complete instructions/literature along with all the possible adverse reactions/side effects for all the Medicines you  take and that have been prescribed to you. Take any new Medicines after you have completely understood and accpet all the possible adverse reactions/side effects.   Do not drive or operate heavy machinery when taking Pain medications.   Do not take more than prescribed Pain, Sleep and Anxiety Medications  Special Instructions: If you have smoked or chewed Tobacco  in the last 2 yrs please stop smoking, stop any regular  Alcohol  and or any Recreational drug use.  Wear Seat belts while driving.  Please note You were cared for by a hospitalist during your hospital stay. If you have any questions about your discharge medications or the care you received while you were in the hospital after you are discharged, you can call the unit and asked to speak with the hospitalist on call if the hospitalist that took care of you is not available. Once you are discharged, your primary care physician will handle any further medical issues. Please note that NO REFILLS for any discharge medications will be authorized once you are discharged, as it is imperative that you return to your primary care physician (or establish a relationship with a primary care physician if you do not have one) for your aftercare needs so that they can reassess your need for medications and monitor your lab values.  You can reach the hospitalist office at phone [phone] or fax [phone]   If you do not have a primary care physician, you can call [phone] for a physician referral.  Activity: As tolerated with Full fall precautions use walker/cane & assistance as needed    Diet: regular  Disposition Home

## 2023-08-23 NOTE — Evaluation (Signed)
Physical Therapy Evaluation Patient Details Name: Barbara Golden MRN: [id number] DOB: [birth year] Today's Date: 08/23/2023  History of Present Illness  88 y.o. female being admitted to the hospital with presyncope in the setting of sudden onset vomiting and diarrhea. Past medical history significant for 2 diabetes, GERD, macular degeneration, Covid  Clinical Impression  Pt admitted with above diagnosis.  Pt currently with functional limitations due to the deficits listed below (see PT Problem List). Pt will benefit from acute skilled PT to increase their independence and safety with mobility to allow discharge.  Pt reports she did not receive breakfast yet and waiting on either breakfast or lunch tray to arrive but agreeable to participate.  Pt ambulated around room with HHA (uses SPC at home) and performed short distance which was limited by dizziness.  Vitals obtained as below.  Pt feels dizziness is from lack of food.  Pt anticipates d/c home later today.  Pt feels comfortable with d/c and states she does have family if needed for assist.   08/23/23 1119  Vital Signs  Pulse Rate 86  Pulse Rate Source Monitor  BP (!) 122/56  BP Location Right Arm  BP Method Automatic  Patient Position (if appropriate) Sitting  Oxygen Therapy  SpO2 91 %  O2 Device Room Air           If plan is discharge home, recommend the following: A little help with walking and/or transfers;Help with stairs or ramp for entrance;Assistance with cooking/housework   Can travel by private vehicle        Equipment Recommendations None recommended by PT  Recommendations for Other Services       Functional Status Assessment Patient has had a recent decline in their functional status and demonstrates the ability to make significant improvements in function in a reasonable and predictable amount of time.     Precautions / Restrictions Precautions Precautions: Fall      Mobility  Bed Mobility Overal bed  mobility: Modified Independent                  Transfers Overall transfer level: Needs assistance Equipment used: None Transfers: Sit to/from Stand Sit to Stand: Supervision                Ambulation/Gait Ambulation/Gait assistance: Contact guard assist Gait Distance (Feet): 32 Feet Assistive device: 1 person hand held assist Gait Pattern/deviations: Step-through pattern, Decreased stride length       General Gait Details: HHA provided as pt reports use of cane at home most of the time, performed approx 32 ft and pt reported dizziness so moved to recliner  Stairs            Wheelchair Mobility     Tilt Bed    Modified Rankin (Stroke Patients Only)       Balance Overall balance assessment: Mild deficits observed, not formally tested (reports using assistive device at home helps her with balance)                                           Pertinent Vitals/Pain Pain Assessment Pain Assessment: No/denies pain    Home Living Family/patient expects to be discharged to:: Private residence Living Arrangements: Children;Other relatives (daughter and son in law) Available Help at Discharge: Family;Available 24 hours/day Type of Home: House Home Access: Stairs to enter Entrance Stairs-Rails: Right;Left;Can reach both Entrance  Stairs-Number of Steps: 4 at front, 1 step in back   Home Layout: One level Home Equipment: Cane - single point;Shower seat;Hand held Stage manager (4 wheels)      Prior Function Prior Level of Function : Independent/Modified Independent             Mobility Comments: in house uses Rollator, but if its tight she uses SPC, uses rascals in shopping stores like [employer]. one fall in last 6 months due to the dogs tripping her up. otherwise no falls ADLs Comments: does her own bathing/dressing     Extremity/Trunk Assessment        Lower Extremity Assessment Lower Extremity Assessment: Generalized  weakness       Communication   Communication Communication: No apparent difficulties  Cognition Arousal: Alert Behavior During Therapy: WFL for tasks assessed/performed Overall Cognitive Status: Within Functional Limits for tasks assessed                                          General Comments      Exercises     Assessment/Plan    PT Assessment Patient needs continued PT services  PT Problem List Decreased strength;Decreased activity tolerance;Decreased balance;Decreased mobility;Decreased knowledge of use of DME;Decreased knowledge of precautions       PT Treatment Interventions DME instruction;Gait training;Balance training;Functional mobility training;Therapeutic activities;Therapeutic exercise;Patient/family education    PT Goals (Current goals can be found in the Care Plan section)  Acute Rehab PT Goals PT Goal Formulation: With patient Time For Goal Achievement: 09/06/23 Potential to Achieve Goals: Good    Frequency Min 1X/week     Co-evaluation               AM-PAC PT "6 Clicks" Mobility  Outcome Measure Help needed turning from your back to your side while in a flat bed without using bedrails?: A Little Help needed moving from lying on your back to sitting on the side of a flat bed without using bedrails?: A Little Help needed moving to and from a bed to a chair (including a wheelchair)?: A Little Help needed standing up from a chair using your arms (e.g., wheelchair or bedside chair)?: A Little Help needed to walk in hospital room?: A Little Help needed climbing 3-5 steps with a railing? : A Little 6 Click Score: 18    End of Session Equipment Utilized During Treatment: Gait belt Activity Tolerance: Patient tolerated treatment well Patient left: in chair;with call bell/phone within reach (set up for chair alarm however batteries not working, NT notified) Nurse Communication: Mobility status PT Visit Diagnosis: Difficulty in  walking, not elsewhere classified (R26.2)    Time: 1110-1130 PT Time Calculation (min) (ACUTE ONLY): 20 min   Charges:   PT Evaluation $PT Eval Low Complexity: 1 Low   PT General Charges $$ ACUTE PT VISIT: 1 Visit       Thomasene Mohair PT, DPT Physical Therapist Acute Rehabilitation Services Office: [phone]   Kati L Payson 08/23/2023, 1:30 PM

## 2023-08-23 NOTE — Discharge Summary (Signed)
Physician Discharge Summary  Barbara Golden:[id number] DOB: September 25, 1935 DOA: 08/21/2023  PCP: [place], Barbara G, MD  Admit date: 08/21/2023 Discharge date: 08/23/2023  Admitted From: home Disposition:  home  Recommendations for Outpatient Follow-up:  Follow up with PCP in 1-2 weeks  Home Health: none Equipment/Devices: none  Discharge Condition: stable CODE STATUS: Full code  HPI: Per admitting MD, Barbara Golden is a 88 y.o. female with medical history significant for 2 diabetes, GERD, macular degeneration being admitted to the hospital with presyncope in the setting of sudden onset vomiting and diarrhea.  States that her daughter was recently sick with the same symptoms, last evening she had sudden onset of abdominal cramping, multiple bouts of vomiting, and diarrhea x 1.  She denies any fevers, or pain of any kind.  After she vomited multiple times, she became very weak, extremely lightheaded, was unable to stand and crawled back to her bedroom, from where she called EMS.  She denies any loss of consciousness, or falling.  Workup in the emergency department was relatively benign as noted below, however she had persistent severe nausea, dizziness with attempts to ambulate.  As such, hospitalist was contacted for admission.   Hospital Course / Discharge diagnoses: Principal problem Presyncope, weakness, dizziness -likely in the setting of dehydration due to persistent vomiting, diarrhea.  This is likely viral in origin given several reported sick contacts.  She received IV fluids with significant improvement in her condition.  Her diarrhea has resolved and she is now tolerating a regular diet without further nausea or vomiting.  She was back to baseline and able to ambulate in the hallway.  COVID, flu, RSV all negative.  Could not send stool studies given that her diarrhea has stopped.  With improvement, she will be discharged home in stable condition.  She has a prescription for PPI "in  case she gets reflux", will discontinued as she is not used it and especially now in the setting of diarrheal illness  Active problems DM2 -A1c 8.3 which is acceptable in her age group.  Continue home medications on discharge Hyperlipidemia-continue statin   Sepsis ruled out   Discharge Instructions   Allergies as of 08/23/2023       Reactions   Ammonia    Unknown rxn.   Codeine Other (See Comments)   Causes lip burning and hyperactivity        Medication List     STOP taking these medications    pantoprazole 40 MG tablet Commonly known as: PROTONIX       TAKE these medications    albuterol 108 (90 Base) MCG/ACT inhaler Commonly known as: VENTOLIN HFA TAKE 2 PUFFS BY MOUTH EVERY 6 HOURS AS NEEDED FOR WHEEZE OR SHORTNESS OF BREATH   Empagliflozin-metFORMIN HCl 12.5-500 MG Tabs Take 1 tablet by mouth 2 (two) times daily with a meal.   FreeStyle Libre 14 Day Reader Hardie Pulley 1 Units by Does not apply route 2 (two) times daily. H35.30, E11.65   FreeStyle Libre 14 Day Sensor Misc APPLY 1 SENSOR EVERY 14 DAYS   gabapentin 100 MG capsule Commonly known as: NEURONTIN Take 1 capsule (100 mg total) by mouth at bedtime.   Medical Compression Thigh High Misc 1 each by Does not apply route daily.   PreserVision AREDS Caps Take 1 capsule by mouth daily.   rosuvastatin 5 MG tablet Commonly known as: CRESTOR Take 5 mg by mouth daily.   Trulicity 1.5 MG/0.5ML Soaj Generic drug: Dulaglutide INJECT 1.5 MG  INTO THE SKIN ONCE A WEEK.        Consultations: none  Procedures/Studies:  CT ABDOMEN PELVIS W CONTRAST Result Date: 08/22/2023 CLINICAL DATA:  Bowel obstruction suspected EXAM: CT ABDOMEN AND PELVIS WITH CONTRAST TECHNIQUE: Multidetector CT imaging of the abdomen and pelvis was performed using the standard protocol following bolus administration of intravenous contrast. RADIATION DOSE REDUCTION: This exam was performed according to the departmental  dose-optimization program which includes automated exposure control, adjustment of the mA and/or kV according to patient size and/or use of iterative reconstruction technique. CONTRAST:  OMNIPAQUE IOHEXOL 300 MG/ML  SOLN COMPARISON:  None Available. FINDINGS: Lower chest:  Coronary atherosclerosis. Hepatobiliary: Suspect hepatic steatosis, but a postcontrast scan. No focal liver abnormality.No evidence of biliary obstruction or stone. Pancreas: Unremarkable. Spleen: Unremarkable. Adrenals/Urinary Tract: Negative adrenals. No hydronephrosis or stone. Unremarkable bladder. Stomach/Bowel:  No obstruction. No appendicitis. Vascular/Lymphatic: No acute vascular abnormality. Severe atherosclerosis with advanced narrowing of the proximal SMA and celiac. Diffuse narrowing and attenuation of the iliacs. No mass or adenopathy. Reproductive:Hysterectomy. Other: No ascites or pneumoperitoneum. Small fatty umbilical hernia. Musculoskeletal: No acute abnormalities. Ordinary and generalized degeneration. IMPRESSION: 1. Negative for bowel obstruction or visible inflammation. 2. Advanced atherosclerosis including the coronary arteries. 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 Electronically Signed   By: Tiburcio Pea M.D.   On: 08/22/2023 05:03     Subjective: - no chest pain, shortness of breath, no abdominal pain, nausea or vomiting.   Discharge Exam: BP (!) 108/95 (BP Location: Right Arm)   Pulse 86   Temp 98.4 F (36.9 C) (Oral)   Resp 16   Ht 5\' 5"  (1.651 m)   Wt 73.5 kg   LMP 07/21/1998 (Approximate)   SpO2 94%   BMI 26.96 kg/m   General: Pt is alert, awake, not in acute distress Cardiovascular: RRR, S1/S2 +, no rubs, no gallops Respiratory: CTA bilaterally, no wheezing, no rhonchi Abdominal: Soft, NT, ND, bowel sounds + Extremities: no edema, no cyanosis    The results of significant diagnostics from this hospitalization (including imaging, microbiology, ancillary and laboratory)  are listed below for reference.     Microbiology: Recent Results (from the past 240 hours)  Resp panel by RT-PCR (RSV, Flu A&B, Covid) Anterior Nasal Swab     Status: None   Collection Time: 08/22/23 12:24 PM   Specimen: Anterior Nasal Swab  Result Value Ref Range Status   SARS Coronavirus 2 by RT PCR NEGATIVE NEGATIVE Final    Comment: (NOTE) SARS-CoV-2 target nucleic acids are NOT DETECTED.  The SARS-CoV-2 RNA is generally detectable in upper respiratory specimens during the acute phase of infection. The lowest concentration of SARS-CoV-2 viral copies this assay can detect is 138 copies/mL. A negative result does not preclude SARS-Cov-2 infection and should not be used as the sole basis for treatment or other patient management decisions. A negative result may occur with  improper specimen collection/handling, submission of specimen other than nasopharyngeal swab, presence of viral mutation(s) within the areas targeted by this assay, and inadequate number of viral copies(<138 copies/mL). A negative result must be combined with clinical observations, patient history, and epidemiological information. The expected result is Negative.  Fact Sheet for Patients:  BloggerCourse.com  Fact Sheet for Healthcare Providers:  SeriousBroker.it  This test is no t yet approved or cleared by the [place] FDA and  has been authorized for detection and/or diagnosis of SARS-CoV-2 by FDA under an Emergency Use Authorization (EUA). This EUA will  remain  in effect (meaning this test can be used) for the duration of the COVID-19 declaration under Section 564(b)(1) of the Act, 21 U.S.C.section 360bbb-3(b)(1), unless the authorization is terminated  or revoked sooner.       Influenza A by PCR NEGATIVE NEGATIVE Final   Influenza B by PCR NEGATIVE NEGATIVE Final    Comment: (NOTE) The Xpert Xpress SARS-CoV-2/FLU/RSV plus assay is intended as an  aid in the diagnosis of influenza from Nasopharyngeal swab specimens and should not be used as a sole basis for treatment. Nasal washings and aspirates are unacceptable for Xpert Xpress SARS-CoV-2/FLU/RSV testing.  Fact Sheet for Patients: BloggerCourse.com  Fact Sheet for Healthcare Providers: SeriousBroker.it  This test is not yet approved or cleared by the [place] FDA and has been authorized for detection and/or diagnosis of SARS-CoV-2 by FDA under an Emergency Use Authorization (EUA). This EUA will remain in effect (meaning this test can be used) for the duration of the COVID-19 declaration under Section 564(b)(1) of the Act, 21 U.S.C. section 360bbb-3(b)(1), unless the authorization is terminated or revoked.     Resp Syncytial Virus by PCR NEGATIVE NEGATIVE Final    Comment: (NOTE) Fact Sheet for Patients: BloggerCourse.com  Fact Sheet for Healthcare Providers: SeriousBroker.it  This test is not yet approved or cleared by the [place] FDA and has been authorized for detection and/or diagnosis of SARS-CoV-2 by FDA under an Emergency Use Authorization (EUA). This EUA will remain in effect (meaning this test can be used) for the duration of the COVID-19 declaration under Section 564(b)(1) of the Act, 21 U.S.C. section 360bbb-3(b)(1), unless the authorization is terminated or revoked.  Performed at [facility], [address], [place], [place] [address]      Labs: Basic Metabolic Panel: Recent Labs  Lab 08/22/23 0131 08/23/23 0530  NA 142 138  K 4.5 3.9  CL 105 104  CO2 25 27  GLUCOSE 159* 133*  BUN 16 14  CREATININE 0.73 0.92  CALCIUM 9.2 8.1*   Liver Function Tests: Recent Labs  Lab 08/22/23 0131  AST 15  ALT 13  ALKPHOS 49  BILITOT 0.9  PROT 7.8  ALBUMIN 4.0   CBC: Recent Labs  Lab 08/22/23 0038 08/23/23 0530  WBC  14.7* 7.1  HGB 13.8 13.4  HCT 43.5 42.4  MCV 95.2 95.5  PLT 253 289   CBG: Recent Labs  Lab 08/22/23 1126 08/22/23 1919 08/22/23 2130 08/23/23 0737 08/23/23 1148  GLUCAP 137* 136* 156* 142* 92   Hgb A1c No results for input(s): "HGBA1C" in the last 72 hours. Lipid Profile No results for input(s): "CHOL", "HDL", "LDLCALC", "TRIG", "CHOLHDL", "LDLDIRECT" in the last 72 hours. Thyroid function studies No results for input(s): "TSH", "T4TOTAL", "T3FREE", "THYROIDAB" in the last 72 hours.  Invalid input(s): "FREET3" Urinalysis    Component Value Date/Time   COLORURINE YELLOW 04/14/2023 0157   APPEARANCEUR CLEAR 04/14/2023 0157   LABSPEC 1.021 04/14/2023 0157   PHURINE 5.0 04/14/2023 0157   GLUCOSEU >=500 (A) 04/14/2023 0157   HGBUR NEGATIVE 04/14/2023 0157   BILIRUBINUR NEGATIVE 04/14/2023 0157   BILIRUBINUR Negative 07/27/2020 1526   KETONESUR 5 (A) 04/14/2023 0157   PROTEINUR NEGATIVE 04/14/2023 0157   UROBILINOGEN negative (A) 07/27/2020 1526   UROBILINOGEN 0.2 03/27/2015 1641   NITRITE NEGATIVE 04/14/2023 0157   LEUKOCYTESUR TRACE (A) 04/14/2023 0157    FURTHER DISCHARGE INSTRUCTIONS:   Get Medicines reviewed and adjusted: Please take all your medications with you for your next  visit with your Primary MD   Laboratory/radiological data: Please request your Primary MD to go over all hospital tests and procedure/radiological results at the follow up, please ask your Primary MD to get all Hospital records sent to his/her office.   In some cases, they will be blood work, cultures and biopsy results pending at the time of your discharge. Please request that your primary care M.D. goes through all the records of your hospital data and follows up on these results.   Also Note the following: If you experience worsening of your admission symptoms, develop shortness of breath, life threatening emergency, suicidal or homicidal thoughts you must seek medical attention  immediately by calling 911 or calling your MD immediately  if symptoms less severe.   You must read complete instructions/literature along with all the possible adverse reactions/side effects for all the Medicines you take and that have been prescribed to you. Take any new Medicines after you have completely understood and accpet all the possible adverse reactions/side effects.    Do not drive when taking Pain medications or sleeping medications (Benzodaizepines)   Do not take more than prescribed Pain, Sleep and Anxiety Medications. It is not advisable to combine anxiety,sleep and pain medications without talking with your primary care practitioner   Special Instructions: If you have smoked or chewed Tobacco  in the last 2 yrs please stop smoking, stop any regular Alcohol  and or any Recreational drug use.   Wear Seat belts while driving.   Please note: You were cared for by a hospitalist during your hospital stay. Once you are discharged, your primary care physician will handle any further medical issues. Please note that NO REFILLS for any discharge medications will be authorized once you are discharged, as it is imperative that you return to your primary care physician (or establish a relationship with a primary care physician if you do not have one) for your post hospital discharge needs so that they can reassess your need for medications and monitor your lab values.  Time coordinating discharge: 35 minutes  SIGNED:  Pamella Pert, MD, PhD 08/23/2023, 1:15 PM

## 2023-08-23 NOTE — Care Management Obs Status (Signed)
MEDICARE OBSERVATION STATUS NOTIFICATION   Patient Details  Name: Barbara Golden MRN: [id number] Date of Birth: October 25, 1935   Medicare Observation Status Notification Given:  Yes    Epifanio Lesches, RN 08/23/2023, 12:38 PM

## 2023-08-25 ENCOUNTER — Telehealth: Payer: Self-pay

## 2023-08-25 NOTE — Transitions of Care (Post Inpatient/ED Visit) (Signed)
 08/25/2023  Name: Barbara Golden MRN: [id number] DOB: 05-27-1936  Today's TOC FU Call Status: Today's TOC FU Call Status:: Successful TOC FU Call Completed Unsuccessful Call (1st Attempt) Date: 08/25/23 Patient's Name and Date of Birth confirmed.  Transition Care Management Follow-up Telephone Call Date of Discharge: 08/23/23 Discharge Facility: [facility]) Type of Discharge: Inpatient Admission Primary Inpatient Discharge Diagnosis:: syncope and collapse How have you been since you were released from the hospital?: Same Any questions or concerns?: Yes Patient Questions/Concerns:: patient's blood sugars keep dropping ranging form 60's-80's Patient Questions/Concerns Addressed: Notified Provider of Patient Questions/Concerns  Items Reviewed: Did you receive and understand the discharge instructions provided?: Yes Medications obtained,verified, and reconciled?: Yes (Medications Reviewed) Any new allergies since your discharge?: No Dietary orders reviewed?: NA Do you have support at home?: Yes People in Home: child(ren), adult  Medications Reviewed Today: Medications Reviewed Today     Reviewed by Nzinga Ferran, [place] LABOR, CMA (Certified Medical Assistant) on 08/25/23 at 1522  Med List Status: <None>   Medication Order Taking? Sig Documenting Provider Last Dose Status Informant  albuterol  (VENTOLIN  HFA) 108 (90 Base) MCG/ACT inhaler [id number]  TAKE 2 PUFFS BY MOUTH EVERY 6 HOURS AS NEEDED FOR WHEEZE OR SHORTNESS OF BREATH Jordan, Betty G, MD  Active Self, Pharmacy Records           Med Note LINDON, [facility]   Sat Aug 22, 2023 10:10 AM) Unknown last dose.  Discontinued 09/13/19 1335 Continuous Blood Gluc Receiver (FREESTYLE LIBRE 14 DAY READER) DEVI [id number] No 1 Units by Does not apply route 2 (two) times daily. H35.30, E11.65 [name], DO Taking Active Self, Pharmacy Records  Continuous Glucose Sensor (FREESTYLE LIBRE [address] [place]) [place] [id number] No APPLY 1 SENSOR EVERY 14  DAYS Jordan, Betty G, MD Taking Active Self, Pharmacy Records  Dulaglutide  (TRULICITY ) 1.5 MG/0.5ML EMMANUEL [id number] No INJECT 1.5 MG INTO THE SKIN ONCE A WEEK. Jordan, Betty G, MD Past Week Active Self, Pharmacy Records  Elastic Bandages & Supports (MEDICAL COMPRESSION THIGH HIGH) MISC [id number] No 1 each by Does not apply route daily. Wieters, Hallie C, PA-C Taking Active Self, Pharmacy Records  Empagliflozin -metFORMIN  HCl 12.5-500 MG TABS [id number] No Take 1 tablet by mouth 2 (two) times daily with a meal. Jordan, Betty G, MD 08/21/2023 Evening Active Self, Pharmacy Records  gabapentin  (NEURONTIN ) 100 MG capsule [id number] No Take 1 capsule (100 mg total) by mouth at bedtime. Jordan, Betty G, MD 08/21/2023 Evening Active Self, Pharmacy Records  Multiple Vitamins-Minerals (PRESERVISION AREDS) CAPS [id number] No Take 1 capsule by mouth daily. [provider] 08/21/2023 Morning Active Self, Pharmacy Records  rosuvastatin  (CRESTOR ) 5 MG tablet [id number] No Take 5 mg by mouth daily. [provider] 08/21/2023 Evening Active Self, Pharmacy Records            Home Care and Equipment/Supplies: Were Home Health Services Ordered?: NA Any new equipment or medical supplies ordered?: NA  Functional Questionnaire: Do you need assistance with bathing/showering or dressing?: No Do you need assistance with meal preparation?: No Do you need assistance with eating?: No Do you have difficulty maintaining continence: No Do you need assistance with getting out of bed/getting out of a chair/moving?: No Do you have difficulty managing or taking your medications?: Yes (daughter helps manage meds)  Follow up appointments reviewed: PCP Follow-up appointment confirmed?: Yes Date of PCP follow-up appointment?: 08/26/23 Follow-up Provider: [name], MD Specialist Hospital Follow-up appointment confirmed?: NA Do you need transportation to your follow-up appointment?:  No Do you understand care  options if your condition(s) worsen?: Yes-patient verbalized understanding    Omarr Hann, CMA  [facility] AWV Team Direct Dial: [phone]

## 2023-08-25 NOTE — Transitions of Care (Post Inpatient/ED Visit) (Signed)
   08/25/2023  Name: Barbara Golden MRN: [id number] DOB: 23-Dec-1935  Today's TOC FU Call Status: Today's TOC FU Call Status:: Unsuccessful Call (1st Attempt) Unsuccessful Call (1st Attempt) Date: 08/25/23  Attempted to reach the patient regarding the most recent Inpatient/ED visit.  Follow Up Plan: Additional outreach attempts will be made to reach the patient to complete the Transitions of Care (Post Inpatient/ED visit) call.   Mariea Mcmartin, CMA  [facility] AWV Team Direct Dial: [phone]

## 2023-08-26 ENCOUNTER — Ambulatory Visit (INDEPENDENT_AMBULATORY_CARE_PROVIDER_SITE_OTHER): Payer: Medicare HMO | Admitting: Family Medicine

## 2023-08-26 ENCOUNTER — Encounter: Payer: Self-pay | Admitting: Family Medicine

## 2023-08-26 VITALS — BP 120/70 | HR 95 | Resp 16 | Ht 65.0 in | Wt 154.0 lb

## 2023-08-26 DIAGNOSIS — K551 Chronic vascular disorders of intestine: Secondary | ICD-10-CM

## 2023-08-26 DIAGNOSIS — Z7984 Long term (current) use of oral hypoglycemic drugs: Secondary | ICD-10-CM

## 2023-08-26 DIAGNOSIS — R55 Syncope and collapse: Secondary | ICD-10-CM | POA: Diagnosis not present

## 2023-08-26 DIAGNOSIS — E782 Mixed hyperlipidemia: Secondary | ICD-10-CM

## 2023-08-26 DIAGNOSIS — I10 Essential (primary) hypertension: Secondary | ICD-10-CM

## 2023-08-26 DIAGNOSIS — E559 Vitamin D deficiency, unspecified: Secondary | ICD-10-CM

## 2023-08-26 DIAGNOSIS — E1142 Type 2 diabetes mellitus with diabetic polyneuropathy: Secondary | ICD-10-CM | POA: Diagnosis not present

## 2023-08-26 NOTE — Assessment & Plan Note (Addendum)
 Reported on abdominal CT done on 08/22/2023. Asymptomatic. We discussed benefits of statin medications. Further recommendation will be given according to lipid panel result.

## 2023-08-26 NOTE — Assessment & Plan Note (Signed)
 She has not been taking rosuvastatin  5 mg, she does not recall any side effect. Lipid panel ordered today, further recommendation will be given according to results.

## 2023-08-26 NOTE — Assessment & Plan Note (Signed)
 Reporting some mildly low glucose. Recommend stopping Trulicity  1.5 mg weekly. Continue empagliflozin -metformin  12.5-500 mg twice daily. Continue monitoring glucose regularly. Avoid skipping meals. Follow-up in 4 weeks, before if needed.

## 2023-08-26 NOTE — Assessment & Plan Note (Signed)
BP adequately controlled. Continue nonpharmacologic treatment. Recommend monitoring BP regularly.

## 2023-08-26 NOTE — Progress Notes (Signed)
 ACUTE VISIT Chief Complaint  Patient presents with   Hospitalization Follow-up   HPI: Barbara Golden is a 88 y.o. female with a PMHx significant for DM II, HTN, HLD, macular degeneration, back pain, varicose vein disease, endometrial cancer, and neuropathy, who is here today with her daughter for hospital follow up.   TOC call on 08/25/23.  Patient was hospitalized from 1/31 to 2/2. Presented to the ER after episode of presyncope with nausea vomiting, and diarrhea.   She was taken off pantoprazole .  Rest of meds unchanged.  HH services were not considered necessary. Her daughter stays with her and she is walking with a walker around the house.  Her daughter has questions about abdominal CT findings, atherosclerosis.  CT abdomen pelvis w contrast from 08/22/2023:  Impression:  Negative for bowel obstruction or visible inflammation.  Advanced atherosclerosis including the coronary arteries. 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   Since returning home, she has been having fatigue, decreased appetite, and an episode of lightheadedness today with no associated symptoms.  She hasn't had a bowel movement since 1/31.  Denies any nausea, vomiting, diarrhea, abdominal pain, headache, chest pain, SOB, wheezing, or urine problems since returning home.   HTN: At the time of discharge BP was 108/95.  She has not been checking it since returning home.  Currently she is on non pharmacologic treatment.  DM II: Also mentions her blood sugars have been fluctuating since returning home.  She has not been taking her Empagliflozin -metformin  12.5-500 mg or Trulicity  because of her low blood sugars, mid 50's and 60's.  HLD: She has also not been taking her rosuvastatin  5 mg. She does not recall having side effects.  Lab Results  Component Value Date   NA 138 08/23/2023   CL 104 08/23/2023   K 3.9 08/23/2023   CO2 27 08/23/2023   BUN 14 08/23/2023   CREATININE 0.92 08/23/2023    GFRNONAA >60 08/23/2023   CALCIUM  8.1 (L) 08/23/2023   PHOS 2.9 04/15/2023   ALBUMIN 4.0 08/22/2023   GLUCOSE 133 (H) 08/23/2023   Lab Results  Component Value Date   WBC 7.1 08/23/2023   HGB 13.4 08/23/2023   HCT 42.4 08/23/2023   MCV 95.5 08/23/2023   PLT 289 08/23/2023   Review of Systems  Constitutional:  Positive for activity change, appetite change and fatigue.  HENT:  Negative for mouth sores and sore throat.   Respiratory:  Negative for cough and wheezing.   Cardiovascular:  Negative for palpitations.  Genitourinary:  Negative for decreased urine volume, dysuria and hematuria.  Musculoskeletal:  Positive for gait problem.  Skin:  Negative for rash.  Neurological:  Negative for syncope, weakness and headaches.  Psychiatric/Behavioral:  Negative for confusion and hallucinations.   See other pertinent positives and negatives in HPI.  Current Outpatient Medications on File Prior to Visit  Medication Sig Dispense Refill   albuterol  (VENTOLIN  HFA) 108 (90 Base) MCG/ACT inhaler TAKE 2 PUFFS BY MOUTH EVERY 6 HOURS AS NEEDED FOR WHEEZE OR SHORTNESS OF BREATH 8.5 each 2   Continuous Blood Gluc Receiver (FREESTYLE LIBRE 14 DAY READER) DEVI 1 Units by Does not apply route 2 (two) times daily. H35.30, E11.65 1 each 2   Continuous Glucose Sensor (FREESTYLE LIBRE 14 DAY SENSOR) MISC APPLY 1 SENSOR EVERY 14 DAYS 3 each 11   Dulaglutide  (TRULICITY ) 1.5 MG/0.5ML SOAJ INJECT 1.5 MG INTO THE SKIN ONCE A WEEK. 2 mL 3   Elastic Bandages & Supports (  MEDICAL COMPRESSION THIGH HIGH) MISC 1 each by Does not apply route daily. 2 each 0   Empagliflozin -metFORMIN  HCl 12.5-500 MG TABS Take 1 tablet by mouth 2 (two) times daily with a meal. 60 tablet 1   gabapentin  (NEURONTIN ) 100 MG capsule Take 1 capsule (100 mg total) by mouth at bedtime. 30 capsule 2   Multiple Vitamins-Minerals (PRESERVISION AREDS) CAPS Take 1 capsule by mouth daily.     rosuvastatin  (CRESTOR ) 5 MG tablet Take 5 mg by mouth  daily.     [DISCONTINUED] cetirizine (ZYRTEC) 10 MG tablet Take 10 mg by mouth daily as needed for allergies.     No current facility-administered medications on file prior to visit.    Past Medical History:  Diagnosis Date   Allergy    Cancer [facility])    endometrial   Cataract    Diabetes mellitus without complication (HCC)    type 2   GERD (gastroesophageal reflux disease)    High cholesterol    Jaundice    as a child   Macular degeneration    Nuclear sclerotic cataract of both eyes 09/05/2020   Varicose veins of lower extremity    right leg   Allergies  Allergen Reactions   Ammonia     Unknown rxn.   Codeine Other (See Comments)    Causes lip burning and hyperactivity    Social History   Socioeconomic History   Marital status: Widowed    Spouse name: Not on file   Number of children: 1   Years of education: Not on file   Highest education level: Not on file  Occupational History   Occupation: retired  Tobacco Use   Smoking status: Former    Current packs/day: 0.00    Types: Cigarettes    Quit date: 03/26/2009    Years since quitting: 14.4   Smokeless tobacco: Never  Vaping Use   Vaping status: Never Used  Substance and Sexual Activity   Alcohol use: No   Drug use: No   Sexual activity: Not Currently  Other Topics Concern   Not on file  Social History Narrative   3 Step Daughters/lives with Daughter(Pam Bouwer) and son-in-law.   Social Drivers of Corporate Investment Banker Strain: Low Risk  (03/03/2023)   Overall Financial Resource Strain (CARDIA)    Difficulty of Paying Living Expenses: Not hard at all  Food Insecurity: No Food Insecurity (08/22/2023)   Hunger Vital Sign    Worried About Running Out of Food in the Last Year: Never true    Ran Out of Food in the Last Year: Never true  Transportation Needs: No Transportation Needs (08/22/2023)   PRAPARE - Administrator, Civil Service (Medical): No    Lack of Transportation (Non-Medical): No   Physical Activity: Insufficiently Active (03/03/2023)   Exercise Vital Sign    Days of Exercise per Week: 7 days    Minutes of Exercise per Session: 10 min  Stress: No Stress Concern Present (03/03/2023)   [employer] of Occupational Health - Occupational Stress Questionnaire    Feeling of Stress : Not at all  Social Connections: Socially Isolated (08/22/2023)   Social Connection and Isolation Panel [NHANES]    Frequency of Communication with Friends and Family: Never    Frequency of Social Gatherings with Friends and Family: Never    Attends Religious Services: Never    Database Administrator or Organizations: No    Attends Banker Meetings: Never  Marital Status: Widowed   Vitals:   08/26/23 1455  BP: 120/70  Pulse: 95  Resp: 16  SpO2: 95%   Body mass index is 25.63 kg/m.  Physical Exam Vitals and nursing note reviewed.  Constitutional:      General: She is not in acute distress.    Appearance: She is well-developed.  HENT:     Head: Normocephalic and atraumatic.     Mouth/Throat:     Mouth: Mucous membranes are moist.     Pharynx: Oropharynx is clear. Uvula midline.  Eyes:     Conjunctiva/sclera: Conjunctivae normal.  Cardiovascular:     Rate and Rhythm: Normal rate and regular rhythm.     Heart sounds: No murmur heard.    Comments: DP and PT pulses palpable bilateral. Pulmonary:     Effort: Pulmonary effort is normal. No respiratory distress.     Breath sounds: Normal breath sounds.  Abdominal:     Palpations: Abdomen is soft. There is no hepatomegaly or mass.     Tenderness: There is no abdominal tenderness.  Musculoskeletal:     Right lower leg: No edema.     Left lower leg: No edema.  Lymphadenopathy:     Cervical: No cervical adenopathy.  Skin:    General: Skin is warm.     Findings: No erythema or rash.  Neurological:     General: No focal deficit present.     Mental Status: She is alert and oriented to person, place, and time.      Cranial Nerves: No cranial nerve deficit.     Comments: Unstable gait assisted with a cane.  Psychiatric:        Mood and Affect: Mood and affect normal.    ASSESSMENT AND PLAN:  Barbara Golden was seen today for hospital follow up for presyncope, vomiting, and diarrhea.  Lab Results  Component Value Date   NA 141 08/26/2023   CL 103 08/26/2023   K 3.9 08/26/2023   CO2 28 08/26/2023   BUN 11 08/26/2023   CREATININE 0.86 08/26/2023   GFR 61.60 08/26/2023   CALCIUM  8.7 08/26/2023   PHOS 2.9 04/15/2023   ALBUMIN 4.1 08/26/2023   GLUCOSE 142 (H) 08/26/2023   Lab Results  Component Value Date   ALT 17 08/26/2023   AST 21 08/26/2023   ALKPHOS 42 08/26/2023   BILITOT 0.3 08/26/2023   Lab Results  Component Value Date   VD25OH 10.25 (L) 08/26/2023   Pre-syncope We discussed possible etiologies, most likely caused by dehydration for possible viral gastroenteritis, symptoms have resolved. Stressed the importance of adequate hydration. Fall precautions recommended. Instructed about warning signs.  -     Comprehensive metabolic panel; Future -     CBC with Differential/Platelet; Future -     VITAMIN D  25 Hydroxy (Vit-D Deficiency, Fractures); Future  Type 2 diabetes mellitus with diabetic polyneuropathy, without long-term current use of insulin  [facility]) Assessment & Plan: Reporting some mildly low glucose. Recommend stopping Trulicity  1.5 mg weekly. Continue empagliflozin -metformin  12.5-500 mg twice daily. Continue monitoring glucose regularly. Avoid skipping meals. Follow-up in 4 weeks, before if needed.  Orders: -     Comprehensive metabolic panel; Future  Essential hypertension Assessment & Plan: BP adequately controlled. Continue non pharmacologic treatment. Recommend monitoring BP regularly.  Hypocalcemia 8.1 during hospitalization. Albumin has been mildly low in the past, about 2 months ago it was 3.4, 4 months ago 3.2. Further recommendation will be given  according to lab results.  -  Comprehensive metabolic panel; Future -     VITAMIN D  25 Hydroxy (Vit-D Deficiency, Fractures); Future  Mixed hyperlipidemia Assessment & Plan: She has not been taking rosuvastatin  5 mg, she does not recall any side effect. Lipid panel ordered today, further recommendation will be given according to results.  Orders: -     Lipid panel; Future  Atherosclerosis of superior mesenteric artery [facility]) Assessment & Plan: Reported on abdominal CT done on 08/22/2023. Asymptomatic. We discussed benefits of statin medications. Further recommendation will be given according to lipid panel result.  I spent a total of 44 minutes in both face to face and non face to face activities for this visit on the date of this encounter. During this time history was obtained and documented, examination was performed, prior labs/imaging reviewed, and assessment/plan discussed.  Return in about 4 weeks (around 09/23/2023).  I, Leonce PARAS Wierda, acting as a scribe for Itzayana Pardy, MD., have documented all relevant documentation on the behalf of Jood Retana, MD, as directed by  Taeko Schaffer, MD while in the presence of Jenavieve Freda, MD.   I, Deandre Stansel, MD, have reviewed all documentation for this visit. The documentation on 08/26/23 for the exam, diagnosis, procedures, and orders are all accurate and complete.  Kandance Yano G. Dana Debo, MD  [facility]. [place] office.

## 2023-08-26 NOTE — Patient Instructions (Addendum)
 A few things to remember from today's visit:  Pre-syncope - Plan: Comprehensive metabolic panel, CBC with Differential/Platelet, VITAMIN D  25 Hydroxy (Vit-D Deficiency, Fractures)  Type 2 diabetes mellitus with diabetic polyneuropathy, without long-term current use of insulin  (HCC) - Plan: Comprehensive metabolic panel  Essential hypertension  Hypocalcemia - Plan: Comprehensive metabolic panel, VITAMIN D  25 Hydroxy (Vit-D Deficiency, Fractures)  Mixed hyperlipidemia - Plan: Lipid panel  Hold on Trulicity  for now.  If you need refills for medications you take chronically, please call your pharmacy. Do not use My Chart to request refills or for acute issues that need immediate attention. If you send a my chart message, it may take a few days to be addressed, specially if I am not in the office.  Please be sure medication list is accurate. If a new problem present, please set up appointment sooner than planned today.

## 2023-08-27 LAB — CBC WITH DIFFERENTIAL/PLATELET
Basophils Absolute: 0.1 10*3/uL (ref 0.0–0.1)
Basophils Relative: 0.8 % (ref 0.0–3.0)
Eosinophils Absolute: 0.1 10*3/uL (ref 0.0–0.7)
Eosinophils Relative: 0.7 % (ref 0.0–5.0)
HCT: 45.6 % (ref 36.0–46.0)
Hemoglobin: 14.9 g/dL (ref 12.0–15.0)
Lymphocytes Relative: 23.6 % (ref 12.0–46.0)
Lymphs Abs: 1.9 10*3/uL (ref 0.7–4.0)
MCHC: 32.6 g/dL (ref 30.0–36.0)
MCV: 92.6 fL (ref 78.0–100.0)
Monocytes Absolute: 0.8 10*3/uL (ref 0.1–1.0)
Monocytes Relative: 9.6 % (ref 3.0–12.0)
Neutro Abs: 5.2 10*3/uL (ref 1.4–7.7)
Neutrophils Relative %: 65.3 % (ref 43.0–77.0)
Platelets: 365 10*3/uL (ref 150.0–400.0)
RBC: 4.93 Mil/uL (ref 3.87–5.11)
RDW: 13.9 % (ref 11.5–15.5)
WBC: 8 10*3/uL (ref 4.0–10.5)

## 2023-08-27 LAB — COMPREHENSIVE METABOLIC PANEL
ALT: 17 U/L (ref 0–35)
AST: 21 U/L (ref 0–37)
Albumin: 4.1 g/dL (ref 3.5–5.2)
Alkaline Phosphatase: 42 U/L (ref 39–117)
BUN: 11 mg/dL (ref 6–23)
CO2: 28 meq/L (ref 19–32)
Calcium: 8.7 mg/dL (ref 8.4–10.5)
Chloride: 103 meq/L (ref 96–112)
Creatinine, Ser: 0.86 mg/dL (ref 0.40–1.20)
GFR: 61.6 mL/min (ref >60.00)
Glucose, Bld: 142 mg/dL — ABNORMAL HIGH (ref 70–99)
Potassium: 3.9 meq/L (ref 3.5–5.1)
Sodium: 141 meq/L (ref 135–145)
Total Bilirubin: 0.3 mg/dL (ref 0.2–1.2)
Total Protein: 7.3 g/dL (ref 6.0–8.3)

## 2023-08-27 LAB — LIPID PANEL
Cholesterol: 71 mg/dL (ref 0–200)
HDL: 30.3 mg/dL — ABNORMAL LOW (ref >39.00)
LDL Cholesterol: 20 mg/dL (ref 0–99)
NonHDL: 40.58
Total CHOL/HDL Ratio: 2
Triglycerides: 104 mg/dL (ref 0.0–149.0)
VLDL: 20.8 mg/dL (ref 0.0–40.0)

## 2023-08-27 LAB — VITAMIN D 25 HYDROXY (VIT D DEFICIENCY, FRACTURES): VITD: 10.25 ng/mL — ABNORMAL LOW (ref 30.00–100.00)

## 2023-08-27 MED ORDER — VITAMIN D (ERGOCALCIFEROL) 1.25 MG (50000 UNIT) PO CAPS: 50000.0000 [IU] | ORAL_CAPSULE | ORAL | 0 refills | Status: DC

## 2023-08-29 ENCOUNTER — Other Ambulatory Visit: Payer: Self-pay | Admitting: Family Medicine

## 2023-08-29 DIAGNOSIS — K219 Gastro-esophageal reflux disease without esophagitis: Secondary | ICD-10-CM

## 2023-09-07 ENCOUNTER — Other Ambulatory Visit: Payer: Self-pay | Admitting: Family Medicine

## 2023-09-07 DIAGNOSIS — E559 Vitamin D deficiency, unspecified: Secondary | ICD-10-CM

## 2023-09-07 DIAGNOSIS — E1142 Type 2 diabetes mellitus with diabetic polyneuropathy: Secondary | ICD-10-CM

## 2023-09-14 ENCOUNTER — Other Ambulatory Visit: Payer: Self-pay

## 2023-09-14 MED ORDER — FREESTYLE LIBRE 3 READER DEVI: 0 refills | Status: AC

## 2023-09-14 MED ORDER — FREESTYLE LIBRE 3 PLUS SENSOR MISC: 3 refills | Status: DC

## 2023-09-16 ENCOUNTER — Other Ambulatory Visit (HOSPITAL_COMMUNITY): Payer: Self-pay

## 2023-09-22 ENCOUNTER — Ambulatory Visit: Payer: Medicare HMO | Admitting: Family Medicine

## 2023-09-22 ENCOUNTER — Encounter: Payer: Self-pay | Admitting: Family Medicine

## 2023-09-22 VITALS — BP 128/60 | HR 85 | Resp 16 | Ht 65.0 in | Wt 158.2 lb

## 2023-09-22 DIAGNOSIS — I10 Essential (primary) hypertension: Secondary | ICD-10-CM

## 2023-09-22 DIAGNOSIS — Z7985 Long-term (current) use of injectable non-insulin antidiabetic drugs: Secondary | ICD-10-CM

## 2023-09-22 DIAGNOSIS — E1140 Type 2 diabetes mellitus with diabetic neuropathy, unspecified: Secondary | ICD-10-CM | POA: Diagnosis not present

## 2023-09-22 DIAGNOSIS — E559 Vitamin D deficiency, unspecified: Secondary | ICD-10-CM | POA: Insufficient documentation

## 2023-09-22 MED ORDER — TRULICITY 0.75 MG/0.5ML ~~LOC~~ SOAJ: 0.7500 mg | SUBCUTANEOUS | 1 refills | Status: DC

## 2023-09-22 NOTE — Progress Notes (Signed)
 HPI: [name] is a 88 y.o. female with a PMHx significant for DM II, HTN, HLD, macular degeneration, back pain, endometrial cancer, and neuropathy, who is here today with her daughter for chronic disease management.  Last seen on 08/26/2023, when she was seen for hospital follow up, pre-syncope. States that she has not had any episode sine her last visit. Feeling back to baseline. Using her cane for transfer and has not had falls. Appetite is back to baseline.  Hypertension:  Not currently on pharmacologic treatment.  Negative for unusual or severe headache, visual changes, exertional chest pain, dyspnea,  focal weakness, or edema.  Lab Results  Component Value Date   CREATININE 0.86 08/26/2023   BUN 11 08/26/2023   NA 141 08/26/2023   K 3.9 08/26/2023   CL 103 08/26/2023   CO2 28 08/26/2023   Diabetes Mellitus II: Dx'ed at age 60. - She is wearing a CGM. Today her blood sugar has been in the 200s. Her app says it has been between 181-280 58% of the time, over 250 37% of the time, and between 70-180 only 5% of the time.  - Currently on Synjardy 12.5-500 mg twice daily. She was taken off Trulicity 1.5 mg at her last visit because she was having blood sugars in the 40s and 50s.  - Negative for symptoms of hypoglycemia, polyuria, polydipsia, foot ulcers/trauma  Lab Results  Component Value Date   HGBA1C 8.3 (A) 07/28/2023   Lab Results  Component Value Date   MICROALBUR 1.3 04/27/2023   Pre-syncope episodes:  She hasn't had any more episodes since her last visit.   Vitamin D deficiency:  Currently on Ergocalciferol 50000 units, which was recommended weekly, she has been taking medication daily instead and has one left. Denies side effects.  Lab Results  Component Value Date   VD25OH 10.25 (L) 08/26/2023   Review of Systems  Constitutional:  Negative for chills and fever.  Respiratory:  Negative for cough and wheezing.   Gastrointestinal:  Negative for abdominal  pain, nausea and vomiting.       Negative for changes in bowel habits.  Genitourinary:  Negative for decreased urine volume, dysuria and hematuria.  Musculoskeletal:  Positive for arthralgias and gait problem.  Skin:  Negative for rash.  Neurological:  Negative for syncope and facial asymmetry.  Psychiatric/Behavioral:  Negative for confusion.   See other pertinent positives and negatives in HPI.  Current Outpatient Medications on File Prior to Visit  Medication Sig Dispense Refill   albuterol (VENTOLIN HFA) 108 (90 Base) MCG/ACT inhaler TAKE 2 PUFFS BY MOUTH EVERY 6 HOURS AS NEEDED FOR WHEEZE OR SHORTNESS OF BREATH 8.5 each 2   Continuous Glucose Receiver (FREESTYLE LIBRE 3 READER) DEVI Use to check blood sugar 1 each 0   Continuous Glucose Sensor (FREESTYLE LIBRE 3 PLUS SENSOR) MISC Change sensor every 15 days. 3 each 3   Elastic Bandages & Supports (MEDICAL COMPRESSION THIGH HIGH) MISC 1 each by Does not apply route daily. 2 each 0   Empagliflozin-metFORMIN HCl (SYNJARDY) 12.5-500 MG TABS TAKE 1 TABLET BY MOUTH TWICE A DAY WITH A MEAL 60 tablet 3   gabapentin (NEURONTIN) 100 MG capsule Take 1 capsule (100 mg total) by mouth at bedtime. 30 capsule 2   Multiple Vitamins-Minerals (PRESERVISION AREDS) CAPS Take 1 capsule by mouth daily.     rosuvastatin (CRESTOR) 5 MG tablet Take 5 mg by mouth daily.     Vitamin D, Ergocalciferol, (DRISDOL) 1.25 MG (50000  UNIT) CAPS capsule TAKE 1 CAPSULE (50,000 UNITS TOTAL) BY MOUTH EVERY 7 (SEVEN) DAYS FOR 12 DOSES. 12 capsule 0   [DISCONTINUED] cetirizine (ZYRTEC) 10 MG tablet Take 10 mg by mouth daily as needed for allergies.     No current facility-administered medications on file prior to visit.    Past Medical History:  Diagnosis Date   Allergy    Cancer [facility])    endometrial   Cataract    Diabetes mellitus without complication (HCC)    type 2   GERD (gastroesophageal reflux disease)    High cholesterol    Jaundice    as a child   Macular  degeneration    Nuclear sclerotic cataract of both eyes 09/05/2020   Varicose veins of lower extremity    right leg   Allergies  Allergen Reactions   Ammonia     Unknown rxn.   Codeine Other (See Comments)    Causes lip burning and hyperactivity    Social History   Socioeconomic History   Marital status: Widowed    Spouse name: Not on file   Number of children: 1   Years of education: Not on file   Highest education level: Not on file  Occupational History   Occupation: retired  Tobacco Use   Smoking status: Former    Current packs/day: 0.00    Types: Cigarettes    Quit date: 03/26/2009    Years since quitting: 14.5   Smokeless tobacco: Never  Vaping Use   Vaping status: Never Used  Substance and Sexual Activity   Alcohol use: No   Drug use: No   Sexual activity: Not Currently  Other Topics Concern   Not on file  Social History Narrative   3 Step Daughters/lives with Daughter(Barbara Golden) and son-in-law.   Social Drivers of Corporate investment banker Strain: Low Risk  (03/03/2023)   Overall Financial Resource Strain (CARDIA)    Difficulty of Paying Living Expenses: Not hard at all  Food Insecurity: No Food Insecurity (08/22/2023)   Hunger Vital Sign    Worried About Running Out of Food in the Last Year: Never true    Ran Out of Food in the Last Year: Never true  Transportation Needs: No Transportation Needs (08/22/2023)   PRAPARE - Administrator, Civil Service (Medical): No    Lack of Transportation (Non-Medical): No  Physical Activity: Insufficiently Active (03/03/2023)   Exercise Vital Sign    Days of Exercise per Week: 7 days    Minutes of Exercise per Session: 10 min  Stress: No Stress Concern Present (03/03/2023)   [employer] of Occupational Health - Occupational Stress Questionnaire    Feeling of Stress : Not at all  Social Connections: Socially Isolated (08/22/2023)   Social Connection and Isolation Panel [NHANES]    Frequency of  Communication with Friends and Family: Never    Frequency of Social Gatherings with Friends and Family: Never    Attends Religious Services: Never    Database administrator or Organizations: No    Attends Banker Meetings: Never    Marital Status: Widowed    Vitals:   09/22/23 1419  BP: 128/60  Pulse: 85  Resp: 16  SpO2: 94%   Body mass index is 26.33 kg/m.  Physical Exam Vitals and nursing note reviewed.  Constitutional:      General: She is not in acute distress.    Appearance: She is well-developed.  HENT:  Head: Normocephalic and atraumatic.  Eyes:     Conjunctiva/sclera: Conjunctivae normal.  Cardiovascular:     Rate and Rhythm: Normal rate and regular rhythm.     Pulses:          Posterior tibial pulses are 2+ on the right side and 2+ on the left side.     Heart sounds: No murmur heard. Pulmonary:     Effort: Pulmonary effort is normal. No respiratory distress.     Breath sounds: Normal breath sounds.  Abdominal:     Palpations: Abdomen is soft. There is no mass.     Tenderness: There is no abdominal tenderness.  Musculoskeletal:     Right lower leg: No edema.     Left lower leg: No edema.  Skin:    General: Skin is warm.     Findings: No erythema or rash.  Neurological:     General: No focal deficit present.     Mental Status: She is alert and oriented to person, place, and time.     Comments: Unstable gait assisted with a cane.  Psychiatric:        Mood and Affect: Mood and affect normal.   ASSESSMENT AND PLAN:  Ms. Torgeson was seen today for chronic disease management.   Type 2 diabetes mellitus with diabetic neuropathy, without long-term current use of insulin [facility]) Assessment & Plan: Last HgA1C was not at gola, 8.3 in 07/2023. Recent BS's elevated. She agrees with resuming Trulicity but lower dose, 0.75 mg weekly. Continue Synjardy 12.5-500 mg twice daily. Annual eye exam, periodic dental and foot care to continue. F/U in 4  months.  Orders: -     Trulicity; Inject 0.75 mg into the skin once a week.  Dispense: 6 mL; Refill: 1  Essential hypertension Assessment & Plan: BP adequately controlled. Continue non pharmacologic treatment. Monitor BP regularly.   Vitamin D deficiency, unspecified Assessment & Plan: Took Ergocalciferol 50,000 U daily instead weekly. It was recommended for 12 weeks and has one cap left. Recommend stopping Ergocalciferol and continuing with OTC vit D 1000 U daily.   I spent a total of 30 minutes in both face to face and non face to face activities for this visit on the date of this encounter. During this time history was obtained and documented, examination was performed, prior labs reviewed, and assessment/plan discussed.  Return in about 4 months (around 01/22/2024) for chronic problems.  I, Rolla Etienne Wierda, acting as a scribe for Kanitra Purifoy [place], MD., have documented all relevant documentation on the behalf of Anita Mcadory [place], MD, as directed by  Drucella Karbowski [place], MD while in the presence of Alias Villagran [place], MD.   I, Tyjae Issa [place], MD, have reviewed all documentation for this visit. The documentation on 09/24/23 for the exam, diagnosis, procedures, and orders are all accurate and complete.  Trevaun Rendleman G. [place], MD  [facility]. [place] office.

## 2023-09-22 NOTE — Patient Instructions (Addendum)
 A few things to remember from today's visit:  Type 2 diabetes mellitus with diabetic neuropathy, without long-term current use of insulin (HCC) - Plan: Dulaglutide (TRULICITY) 0.75 MG/0.5ML SOAJ  Essential hypertension Trulicity 0.75 mg weekly added today. Stop prescription of vit D.  If you need refills for medications you take chronically, please call your pharmacy. Do not use My Chart to request refills or for acute issues that need immediate attention. If you send a my chart message, it may take a few days to be addressed, specially if I am not in the office.  Please be sure medication list is accurate. If a new problem present, please set up appointment sooner than planned today.

## 2023-09-24 NOTE — Assessment & Plan Note (Signed)
BP adequately controlled. Continue nonpharmacologic treatment. Monitor BP regularly. 

## 2023-09-24 NOTE — Assessment & Plan Note (Signed)
 Took Ergocalciferol 50,000 U daily instead weekly. It was recommended for 12 weeks and has one cap left. Recommend stopping Ergocalciferol and continuing with OTC vit D 1000 U daily.

## 2023-09-24 NOTE — Assessment & Plan Note (Signed)
 Last HgA1C was not at gola, 8.3 in 07/2023. Recent BS's elevated. She agrees with resuming Trulicity but lower dose, 0.75 mg weekly. Continue Synjardy 12.5-500 mg twice daily. Annual eye exam, periodic dental and foot care to continue. F/U in 4 months.

## 2023-09-25 ENCOUNTER — Other Ambulatory Visit: Payer: Self-pay | Admitting: Family Medicine

## 2023-09-25 NOTE — Progress Notes (Signed)
 No show

## 2023-09-28 ENCOUNTER — Telehealth: Payer: Self-pay

## 2023-09-28 DIAGNOSIS — E1140 Type 2 diabetes mellitus with diabetic neuropathy, unspecified: Secondary | ICD-10-CM

## 2023-09-28 MED ORDER — TRULICITY 0.75 MG/0.5ML ~~LOC~~ SOAJ: 0.7500 mg | SUBCUTANEOUS | 1 refills | Status: DC

## 2023-09-28 NOTE — Telephone Encounter (Signed)
 Rx resent.

## 2023-09-28 NOTE — Telephone Encounter (Signed)
 Copied from CRM [id number]. Topic: Clinical - Prescription Issue >> Sep 28, 2023 12:32 PM Eunice Blase wrote: Reason for CRM: Pt's daughter called stated [facility] Dulaglutide (TRULICITY) 0.75 MG/0.5ML SOAJ. Please call [facility] at Phone: [phone], [id number] [place], [place] - [address] [place] [address] Fax: [phone]

## 2023-09-29 ENCOUNTER — Other Ambulatory Visit: Payer: Self-pay | Admitting: Family Medicine

## 2023-09-29 DIAGNOSIS — K219 Gastro-esophageal reflux disease without esophagitis: Secondary | ICD-10-CM

## 2023-09-29 DIAGNOSIS — E559 Vitamin D deficiency, unspecified: Secondary | ICD-10-CM

## 2023-10-01 ENCOUNTER — Other Ambulatory Visit (HOSPITAL_COMMUNITY): Payer: Self-pay

## 2023-10-02 ENCOUNTER — Other Ambulatory Visit: Payer: Self-pay | Admitting: Family Medicine

## 2023-10-03 ENCOUNTER — Emergency Department (HOSPITAL_COMMUNITY)
Admission: EM | Admit: 2023-10-03 | Discharge: 2023-10-03 | Disposition: A | Discharge status: Home / Self Care | Attending: Emergency Medicine | Admitting: Emergency Medicine

## 2023-10-03 ENCOUNTER — Encounter (HOSPITAL_COMMUNITY): Payer: Self-pay | Admitting: Emergency Medicine

## 2023-10-03 ENCOUNTER — Emergency Department (HOSPITAL_BASED_OUTPATIENT_CLINIC_OR_DEPARTMENT_OTHER)

## 2023-10-03 ENCOUNTER — Other Ambulatory Visit: Payer: Self-pay

## 2023-10-03 DIAGNOSIS — I8393 Asymptomatic varicose veins of bilateral lower extremities: Secondary | ICD-10-CM | POA: Insufficient documentation

## 2023-10-03 DIAGNOSIS — E119 Type 2 diabetes mellitus without complications: Secondary | ICD-10-CM | POA: Insufficient documentation

## 2023-10-03 DIAGNOSIS — I8391 Asymptomatic varicose veins of right lower extremity: Secondary | ICD-10-CM | POA: Diagnosis not present

## 2023-10-03 DIAGNOSIS — M7989 Other specified soft tissue disorders: Secondary | ICD-10-CM

## 2023-10-03 DIAGNOSIS — M79661 Pain in right lower leg: Secondary | ICD-10-CM | POA: Diagnosis not present

## 2023-10-03 DIAGNOSIS — I83811 Varicose veins of right lower extremities with pain: Secondary | ICD-10-CM | POA: Diagnosis not present

## 2023-10-03 DIAGNOSIS — I83891 Varicose veins of right lower extremities with other complications: Secondary | ICD-10-CM | POA: Diagnosis not present

## 2023-10-03 DIAGNOSIS — Z7984 Long term (current) use of oral hypoglycemic drugs: Secondary | ICD-10-CM | POA: Diagnosis not present

## 2023-10-03 LAB — CBG MONITORING, ED: Glucose-Capillary: 189 mg/dL — ABNORMAL HIGH (ref 70–99)

## 2023-10-03 NOTE — Progress Notes (Signed)
 VASCULAR LAB    Right lower extremity venous duplex has been performed.  See CV proc for preliminary results.  Relayed results to Dr. Anitra Lauth via secure chat  Sherren Kerns, RVT 10/03/2023, 5:22 PM

## 2023-10-03 NOTE — Discharge Instructions (Signed)
 No signs of blood clots or inflammation of the veins today.  You have a pulse in your foot and no sign of anything blocking your blood flow in your groin.  If you start noticing that there is redness or pain in the leg or significant swelling follow-up with your doctor.

## 2023-10-03 NOTE — ED Triage Notes (Signed)
 Pt reports having pain in her varicose vein of her right leg since yesterday. Pt notes knots in the leg. Pt states she's able to walk but doesn't feel steady. Pt also reports her right knee gave out twice.

## 2023-10-03 NOTE — ED Notes (Signed)
 Patient transported to Ultrasound

## 2023-10-03 NOTE — ED Provider Notes (Signed)
 [place] EMERGENCY DEPARTMENT AT [facility] Provider Note   CSN: [id number] Arrival date & time: 10/03/23  1340     History  Chief Complaint  Patient presents with   Leg Pain    Barbara Golden is a 88 y.o. female.  Patient is an 88 year old female with a history of varicose veins bilaterally, diabetes, macular degeneration, GERD who is presenting today because her family noticed when she stood her varicose veins were very engorged.  Patient reports she has had no pain in her leg, recent travel.  She has not noticed any swelling of the leg and when she sits down the veins go back to being flat.  She has no pain in her inner thigh.  She has no abdominal pain.  She has been defecating normally.  Her dad has a history of DVTs but she has never had 1.  She does not take anticoagulation.   Leg Pain      Home Medications Prior to Admission medications   Medication Sig Start Date End Date Taking? Authorizing Provider  albuterol (VENTOLIN HFA) 108 (90 Base) MCG/ACT inhaler TAKE 2 PUFFS BY MOUTH EVERY 6 HOURS AS NEEDED FOR WHEEZE OR SHORTNESS OF BREATH 08/03/23   [place], Betty G, MD  Continuous Glucose Receiver (FREESTYLE LIBRE 3 READER) DEVI Use to check blood sugar 09/14/23   [place], Betty G, MD  Continuous Glucose Sensor (FREESTYLE LIBRE 3 PLUS SENSOR) MISC Change sensor every 15 days. 09/14/23   [place], Betty G, MD  Dulaglutide (TRULICITY) 0.75 MG/0.5ML SOAJ Inject 0.75 mg into the skin once a week. 09/28/23   [place], Betty G, MD  Elastic Bandages & Supports (MEDICAL COMPRESSION THIGH HIGH) MISC 1 each by Does not apply route daily. 04/12/20   Wieters, Hallie C, PA-C  Empagliflozin-metFORMIN HCl (SYNJARDY) 12.5-500 MG TABS TAKE 1 TABLET BY MOUTH TWICE A DAY WITH A MEAL 09/07/23   [place], Betty G, MD  gabapentin (NEURONTIN) 100 MG capsule Take 1 capsule (100 mg total) by mouth at bedtime. 07/28/23   [place], Betty G, MD  Multiple Vitamins-Minerals (PRESERVISION AREDS) CAPS Take 1  capsule by mouth daily. 08/21/20   [provider]  rosuvastatin (CRESTOR) 5 MG tablet Take 5 mg by mouth daily. 08/17/20   [provider]  cetirizine (ZYRTEC) 10 MG tablet Take 10 mg by mouth daily as needed for allergies.  09/13/19  [provider]      Allergies    Ammonia and Codeine    Review of Systems   Review of Systems  Physical Exam Updated Vital Signs BP (!) 138/56 (BP Location: Right Arm)   Pulse 85   Temp 98 F (36.7 C)   Resp 19   Ht 5\' 5"  (1.651 m)   Wt 70 kg   LMP 07/21/1998 (Approximate)   SpO2 95%   BMI 25.68 kg/m  Physical Exam Vitals and nursing note reviewed.  Constitutional:      General: She is not in acute distress.    Appearance: She is well-developed.  HENT:     Head: Normocephalic and atraumatic.  Eyes:     Pupils: Pupils are equal, round, and reactive to light.  Cardiovascular:     Rate and Rhythm: Normal rate and regular rhythm.     Heart sounds: Normal heart sounds. No murmur heard.    No friction rub.  Pulmonary:     Effort: Pulmonary effort is normal.     Breath sounds: Normal breath sounds. No wheezing or rales.  Abdominal:     General: Bowel sounds are normal. There is no distension.     Palpations: Abdomen is soft.     Tenderness: There is no abdominal tenderness. There is no guarding or rebound.  Musculoskeletal:        General: No tenderness. Normal range of motion.     Comments: No edema.  No erythema noted to the leg.  No tenderness with palpation of the calf or medial thigh.  Pulse was dopplered in the right foot with biphasic sounds.  Capillary refill less than 3 seconds  Skin:    General: Skin is warm and dry.     Findings: No rash.  Neurological:     Mental Status: She is alert and oriented to person, place, and time.     Cranial Nerves: No cranial nerve deficit.  Psychiatric:        Behavior: Behavior normal.     ED Results / Procedures / Treatments   Labs (all labs ordered are listed, but  only abnormal results are displayed) Labs Reviewed  CBG MONITORING, ED - Abnormal; Notable for the following components:      Result Value   Glucose-Capillary 189 (*)    All other components within normal limits    EKG None  Radiology VAS [place] LOWER EXTREMITY VENOUS (DVT) (7a-7p) Result Date: 10/03/2023  Lower Venous DVT Study Patient Name:  Barbara Golden [facility]  Date of Exam:   10/03/2023 Medical Rec #: [id number]        Accession #:    [id number] Date of Birth: [birth year]         Patient Gender: F Patient Age:   71 years Exam Location:  [facility] Procedure:      VAS [place] LOWER EXTREMITY VENOUS (DVT) Referring Phys: Jaxxen Voong --------------------------------------------------------------------------------  Indications: Tenderness and knots noted in proximal calf. Patient states knee gave out twice.  Risk Factors: Known venous reflux disease. Comparison Study: Prior negative right LEV done 03/06/22. Patient had venous                   reflux exam at [facility] 05/17/20 indicating deep venous reflux                   disease in the right CFV and superficial venous reflux in the                   right SFJ and GSV. Performing Technologist: Sherren Kerns RVS  Examination Guidelines: A complete evaluation includes B-mode imaging, spectral Doppler, color Doppler, and power Doppler as needed of all accessible portions of each vessel. Bilateral testing is considered an integral part of a complete examination. Limited examinations for reoccurring indications may be performed as noted. The reflux portion of the exam is performed with the patient in reverse Trendelenburg.  +---------+---------------+---------+-----------+----------+--------------+ RIGHT    CompressibilityPhasicitySpontaneityPropertiesThrombus Aging +---------+---------------+---------+-----------+----------+--------------+ CFV      Full           Yes      Yes                                  +---------+---------------+---------+-----------+----------+--------------+ SFJ      Full                                                        +---------+---------------+---------+-----------+----------+--------------+  FV Prox  Full                                                        +---------+---------------+---------+-----------+----------+--------------+ FV Mid   Full                                                        +---------+---------------+---------+-----------+----------+--------------+ FV DistalFull                                                        +---------+---------------+---------+-----------+----------+--------------+ PFV      Full                                                        +---------+---------------+---------+-----------+----------+--------------+ POP      Full           Yes      Yes                                 +---------+---------------+---------+-----------+----------+--------------+ PTV      Full                                                        +---------+---------------+---------+-----------+----------+--------------+ PERO     Full                                                        +---------+---------------+---------+-----------+----------+--------------+ GSV      Full                                                        +---------+---------------+---------+-----------+----------+--------------+ SSV      Full                                                        +---------+---------------+---------+-----------+----------+--------------+   +----+---------------+---------+-----------+----------+--------------+ LEFTCompressibilityPhasicitySpontaneityPropertiesThrombus Aging +----+---------------+---------+-----------+----------+--------------+ CFV Full           Yes      Yes                                  +----+---------------+---------+-----------+----------+--------------+  Summary: RIGHT: - There is no evidence of deep vein thrombosis in the lower extremity. - There is no evidence of superficial venous thrombosis.  - No cystic structure found in the popliteal fossa.  LEFT: - No evidence of common femoral vein obstruction.   *See table(s) above for measurements and observations.    Preliminary     Procedures Procedures    Medications Ordered in ED Medications - No data to display  ED Course/ Medical Decision Making/ A&P                                 Medical Decision Making Amount and/or Complexity of Data Reviewed Radiology: ordered and independent interpretation performed. Decision-making details documented in ED Course.   Patient presenting today because the varicose veins in her right leg seemed very engorged today which they took a picture of when she was standing.  She does wear compression socks daily.  Has no other complaints at this time.  There is no pain in her leg and she has no prior history of DVTs.  No evidence of arterial ischemia today, infectious etiology.  Low suspicion for superficial venous thrombosis as there is no tenderness.  I have independently visualized and interpreted pt's images today. Ultrasound was negative for DVT.  Radiology reports no evidence of DVT, femoral obstruction or venous thrombosis.  At this time we will have patient continue to wear her compression socks and follow-up with her PCP if any of the above develop.        Final Clinical Impression(s) / ED Diagnoses Final diagnoses:  Asymptomatic varicose veins of both lower extremities    Rx / DC Orders ED Discharge Orders     None         Gwyneth Sprout, MD 10/03/23 1745

## 2023-10-05 ENCOUNTER — Telehealth: Payer: Self-pay

## 2023-10-05 NOTE — Transitions of Care (Post Inpatient/ED Visit) (Signed)
   10/05/2023  Name: Barbara Golden MRN: [id number] DOB: 1935/12/29  Today's TOC FU Call Status: Today's TOC FU Call Status:: Successful TOC FU Call Completed TOC FU Call Complete Date: 10/05/23 Patient's Name and Date of Birth confirmed.  Transition Care Management Follow-up Telephone Call Date of Discharge: 10/03/23 Discharge Facility: [facility]) Type of Discharge: Emergency Department Reason for ED Visit: Other: (varicose veins) How have you been since you were released from the hospital?: Better Any questions or concerns?: No  Items Reviewed: Did you receive and understand the discharge instructions provided?: Yes Medications obtained,verified, and reconciled?: Yes (Medications Reviewed) Any new allergies since your discharge?: No Dietary orders reviewed?: Yes Do you have support at home?: No  Medications Reviewed Today: Medications Reviewed Today     Reviewed by Karena Addison, LPN (Licensed Practical Nurse) on 10/05/23 at 1719  Med List Status: <None>   Medication Order Taking? Sig Documenting Provider Last Dose Status Informant  albuterol (VENTOLIN HFA) 108 (90 Base) MCG/ACT inhaler [id number] No TAKE 2 PUFFS BY MOUTH EVERY 6 HOURS AS NEEDED FOR WHEEZE OR SHORTNESS OF BREATH [place], Betty G, MD Taking Active Self, Pharmacy Records           Med Note Erin Fulling, [facility]   Sat Aug 22, 2023 10:10 AM) Unknown last dose.  Discontinued 09/13/19 1335 Continuous Glucose Receiver (FREESTYLE LIBRE 3 READER) DEVI [id number] No Use to check blood sugar [place], Betty G, MD Taking Active   Continuous Glucose Sensor (FREESTYLE LIBRE 3 PLUS SENSOR) MISC [id number] No Change sensor every 15 days. [place], Betty G, MD Taking Active   Dulaglutide (TRULICITY) 0.75 MG/0.5ML Ivory Broad [id number]  Inject 0.75 mg into the skin once a week. [place], Betty G, MD  Active   Elastic Bandages & Supports (MEDICAL COMPRESSION THIGH HIGH) MISC [id number] No 1 each by Does not apply route daily. Wieters, Junius Creamer,  PA-C Taking Active Self, Pharmacy Records  Empagliflozin-metFORMIN HCl [facility]) 12.5-500 MG TABS [id number] No TAKE 1 TABLET BY MOUTH TWICE A DAY WITH A MEAL [place], Betty G, MD Taking Active   gabapentin (NEURONTIN) 100 MG capsule [id number] No Take 1 capsule (100 mg total) by mouth at bedtime. [place], Betty G, MD Taking Active Self, Pharmacy Records  Multiple Vitamins-Minerals (PRESERVISION AREDS) CAPS [id number] No Take 1 capsule by mouth daily. [provider] Taking Active Self, Pharmacy Records  rosuvastatin (CRESTOR) 5 MG tablet [id number] No Take 5 mg by mouth daily. [provider] Taking Active Self, Pharmacy Records            Home Care and Equipment/Supplies: Were Home Health Services Ordered?: NA Any new equipment or medical supplies ordered?: NA  Functional Questionnaire: Do you need assistance with bathing/showering or dressing?: No Do you need assistance with meal preparation?: No Do you need assistance with eating?: No Do you have difficulty maintaining continence: No Do you need assistance with getting out of bed/getting out of a chair/moving?: No Do you have difficulty managing or taking your medications?: No  Follow up appointments reviewed: PCP Follow-up appointment confirmed?: Yes Date of PCP follow-up appointment?: 10/07/23 Follow-up Provider[facility] [place] [facility] Follow-up appointment confirmed?: NA Do you need transportation to your follow-up appointment?: No Do you understand care options if your condition(s) worsen?: Yes-patient verbalized understanding    SIGNATURE Karena Addison, LPN [facility] Nurse Health Advisor Direct Dial [phone]

## 2023-10-07 ENCOUNTER — Inpatient Hospital Stay: Admitting: Family Medicine

## 2023-10-09 ENCOUNTER — Ambulatory Visit (INDEPENDENT_AMBULATORY_CARE_PROVIDER_SITE_OTHER): Admitting: Family Medicine

## 2023-10-09 ENCOUNTER — Encounter: Payer: Self-pay | Admitting: Family Medicine

## 2023-10-09 VITALS — BP 128/64 | HR 94 | Temp 97.4°F | Resp 16 | Ht 65.0 in | Wt 157.2 lb

## 2023-10-09 DIAGNOSIS — I8393 Asymptomatic varicose veins of bilateral lower extremities: Secondary | ICD-10-CM

## 2023-10-09 DIAGNOSIS — E1142 Type 2 diabetes mellitus with diabetic polyneuropathy: Secondary | ICD-10-CM

## 2023-10-09 DIAGNOSIS — Z7985 Long-term (current) use of injectable non-insulin antidiabetic drugs: Secondary | ICD-10-CM | POA: Diagnosis not present

## 2023-10-09 MED ORDER — TRULICITY 1.5 MG/0.5ML ~~LOC~~ SOAJ: 1.5000 mg | SUBCUTANEOUS | Status: DC

## 2023-10-09 MED ORDER — SYNJARDY 12.5-500 MG PO TABS: 1.0000 | ORAL_TABLET | Freq: Two times a day (BID) | ORAL | 2 refills | Status: AC

## 2023-10-09 NOTE — Assessment & Plan Note (Signed)
 Educated about diagnosis, prognosis, and treatment options. Currently she is asymptomatic. Recommend continue compression stockings and LE elevation. Continue appropriate skin care.

## 2023-10-09 NOTE — Assessment & Plan Note (Signed)
 BS's in the 200's. She agrees with increasing dose of Trulicity from 0.75 mg weekly to 1.5 mg weekly. Continue empagliflozin-metformin 12.5-500 mg 1 tablet twice daily. She will try to stop juice intake. Follow-up in 3 months.

## 2023-10-09 NOTE — Progress Notes (Signed)
 ACUTE VISIT Chief Complaint  Patient presents with   Hospitalization Follow-up   HPI: Ms.Andy L Kuwahara is a 88 y.o. female with a history of varicose veins bilaterally, diabetes, macular degeneration, and GERD who is here today with her daughter for ER follow up.   She presented to the ED on 3/15 because her family noticed when she stood her varicose veins were engorged. She was concerned about DVT. Negative for LE pain, recent travel, and no hx of trauma. She had not noticed any LE edema or erythema. When she sat down the veins went back to being flat.  No prior hx of thrombotic event.  Venous vas [place] LE 10/03/23: - There is no evidence of deep vein thrombosis in the lower extremity.  - There is no evidence of superficial venous thrombosis.   - No cystic structure found in the popliteal fossa.   LEFT:  - No evidence of common femoral vein obstruction.    Currently, she says she is doing well.  Denies any chest pain, SOB, or palpitations.   Diabetes Mellitus II: Dx'ed at age 57. - Checking BG at home: When checked in the ER, her blood sugar was 189. On 3/19, it was 288. In general for the past few days it has been in the mid 200's. -Currently on Trulicity 0.75 mg weekly (resumed last visit) and Synjardy 12.5-500 mg twice daily.  - Diet: She mentions she drinks occasional diet sodas and juice.  - Negative for hypoglycemic events, polyuria, polydipsia,foot ulcers/trauma  Lab Results  Component Value Date   HGBA1C 8.3 (A) 07/28/2023   Lab Results  Component Value Date   MICROALBUR 1.3 04/27/2023   Review of Systems  Constitutional:  Negative for activity change, appetite change and fever.  HENT:  Negative for nosebleeds and sore throat.   Respiratory:  Negative for cough.   Gastrointestinal:  Negative for abdominal pain, nausea and vomiting.  Genitourinary:  Negative for decreased urine volume, dysuria and hematuria.  Musculoskeletal:  Positive for arthralgias and gait  problem.  Skin:  Negative for rash.  Neurological:  Negative for syncope, weakness and headaches.  Psychiatric/Behavioral:  Negative for confusion and hallucinations.   See other pertinent positives and negatives in HPI.  Current Outpatient Medications on File Prior to Visit  Medication Sig Dispense Refill   albuterol (VENTOLIN HFA) 108 (90 Base) MCG/ACT inhaler TAKE 2 PUFFS BY MOUTH EVERY 6 HOURS AS NEEDED FOR WHEEZE OR SHORTNESS OF BREATH 8.5 each 2   Continuous Glucose Receiver (FREESTYLE LIBRE 3 READER) DEVI Use to check blood sugar 1 each 0   Continuous Glucose Sensor (FREESTYLE LIBRE 3 PLUS SENSOR) MISC Change sensor every 15 days. 3 each 3   Elastic Bandages & Supports (MEDICAL COMPRESSION THIGH HIGH) MISC 1 each by Does not apply route daily. 2 each 0   gabapentin (NEURONTIN) 100 MG capsule Take 1 capsule (100 mg total) by mouth at bedtime. 30 capsule 2   Multiple Vitamins-Minerals (PRESERVISION AREDS) CAPS Take 1 capsule by mouth daily.     rosuvastatin (CRESTOR) 5 MG tablet Take 5 mg by mouth daily.     [DISCONTINUED] cetirizine (ZYRTEC) 10 MG tablet Take 10 mg by mouth daily as needed for allergies.     No current facility-administered medications on file prior to visit.    Past Medical History:  Diagnosis Date   Allergy    Cancer [facility])    endometrial   Cataract    Diabetes mellitus without complication (HCC)  type 2   GERD (gastroesophageal reflux disease)    High cholesterol    Jaundice    as a child   Macular degeneration    Nuclear sclerotic cataract of both eyes 09/05/2020   Varicose veins of lower extremity    right leg   Allergies  Allergen Reactions   Ammonia     Unknown rxn.   Codeine Other (See Comments)    Causes lip burning and hyperactivity   Social History   Socioeconomic History   Marital status: Widowed    Spouse name: Not on file   Number of children: 1   Years of education: Not on file   Highest education level: Not on file   Occupational History   Occupation: retired  Tobacco Use   Smoking status: Former    Current packs/day: 0.00    Types: Cigarettes    Quit date: 03/26/2009    Years since quitting: 14.5   Smokeless tobacco: Never  Vaping Use   Vaping status: Never Used  Substance and Sexual Activity   Alcohol use: No   Drug use: No   Sexual activity: Not Currently  Other Topics Concern   Not on file  Social History Narrative   3 Step Daughters/lives with Daughter(Pam Bouwer) and son-in-law.   Social Drivers of Corporate investment banker Strain: Low Risk  (03/03/2023)   Overall Financial Resource Strain (CARDIA)    Difficulty of Paying Living Expenses: Not hard at all  Food Insecurity: No Food Insecurity (08/22/2023)   Hunger Vital Sign    Worried About Running Out of Food in the Last Year: Never true    Ran Out of Food in the Last Year: Never true  Transportation Needs: No Transportation Needs (08/22/2023)   PRAPARE - Administrator, Civil Service (Medical): No    Lack of Transportation (Non-Medical): No  Physical Activity: Insufficiently Active (03/03/2023)   Exercise Vital Sign    Days of Exercise per Week: 7 days    Minutes of Exercise per Session: 10 min  Stress: No Stress Concern Present (03/03/2023)   [employer] of Occupational Health - Occupational Stress Questionnaire    Feeling of Stress : Not at all  Social Connections: Socially Isolated (08/22/2023)   Social Connection and Isolation Panel [NHANES]    Frequency of Communication with Friends and Family: Never    Frequency of Social Gatherings with Friends and Family: Never    Attends Religious Services: Never    Database administrator or Organizations: No    Attends Banker Meetings: Never    Marital Status: Widowed   Vitals:   10/09/23 1558  BP: 128/64  Pulse: 94  Resp: 16  Temp: (!) 97.4 F (36.3 C)  SpO2: 94%   Body mass index is 26.17 kg/m.  Physical Exam Vitals and nursing note reviewed.   Constitutional:      General: She is not in acute distress.    Appearance: She is well-developed.  HENT:     Head: Normocephalic and atraumatic.  Eyes:     Conjunctiva/sclera: Conjunctivae normal.  Cardiovascular:     Rate and Rhythm: Normal rate and regular rhythm.     Heart sounds: No murmur heard.    Comments: Tortuous varicose veins LE, R>L.  PT pulses palpable. Pulmonary:     Effort: Pulmonary effort is normal. No respiratory distress.     Breath sounds: Normal breath sounds.  Skin:    General: Skin is warm.  Findings: No erythema or rash.  Neurological:     Mental Status: She is alert and oriented to person, place, and time.     Comments: Unstable gait assisted with a cane.  Psychiatric:        Mood and Affect: Mood and affect normal.   ASSESSMENT AND PLAN:  Ms. Mandala was seen today for ER follow up for varicose vein problems.   Type 2 diabetes mellitus with diabetic polyneuropathy, without long-term current use of insulin (HCC) Assessment & Plan: BS's in the 200's. She agrees with increasing dose of Trulicity from 0.75 mg weekly to 1.5 mg weekly. Continue empagliflozin-metformin 12.5-500 mg 1 tablet twice daily. She will try to stop juice intake. Follow-up in 3 months.  Orders: -     Trulicity; Inject 1.5 mg into the skin once a week. Kirk Ruths; Take 1 tablet by mouth 2 (two) times daily with a meal.  Dispense: 180 tablet; Refill: 2  Asymptomatic varicose veins of both lower extremities Assessment & Plan: Educated about diagnosis, prognosis, and treatment options. Currently she is asymptomatic. Recommend continue compression stockings and LE elevation. Continue appropriate skin care.   I spent a total of 32 minutes in both face to face and non face to face activities for this visit on the date of this encounter. During this time history was obtained and documented, examination was performed, prior labs/imaging reviewed, and assessment/plan  discussed.  Return in about 3 months (around 01/09/2024) for chronic problems.  I, Rolla Etienne Wierda, acting as a scribe for Zadiel Leyh [place], MD., have documented all relevant documentation on the behalf of Rukiya Hodgkins [place], MD, as directed by  Shayanne Gomm [place], MD while in the presence of Deland Slocumb [place], MD.   I, Amaliya Whitelaw [place], MD, have reviewed all documentation for this visit. The documentation on 10/09/23 for the exam, diagnosis, procedures, and orders are all accurate and complete.  Jacqualynn Parco G. [place], MD  [facility]. [place] office.

## 2023-10-21 ENCOUNTER — Other Ambulatory Visit: Payer: Self-pay | Admitting: Family Medicine

## 2023-10-30 ENCOUNTER — Other Ambulatory Visit: Payer: Self-pay | Admitting: Family Medicine

## 2023-10-30 DIAGNOSIS — K219 Gastro-esophageal reflux disease without esophagitis: Secondary | ICD-10-CM

## 2023-11-03 ENCOUNTER — Other Ambulatory Visit: Payer: Self-pay | Admitting: Family Medicine

## 2023-11-03 DIAGNOSIS — K219 Gastro-esophageal reflux disease without esophagitis: Secondary | ICD-10-CM

## 2023-11-03 DIAGNOSIS — G63 Polyneuropathy in diseases classified elsewhere: Secondary | ICD-10-CM

## 2023-11-09 ENCOUNTER — Encounter: Payer: Self-pay | Admitting: Family Medicine

## 2023-11-09 DIAGNOSIS — E1142 Type 2 diabetes mellitus with diabetic polyneuropathy: Secondary | ICD-10-CM

## 2023-11-10 MED ORDER — TRULICITY 1.5 MG/0.5ML ~~LOC~~ SOAJ: 1.5000 mg | SUBCUTANEOUS | 2 refills | Status: AC

## 2023-11-27 ENCOUNTER — Other Ambulatory Visit: Payer: Self-pay | Admitting: Family Medicine

## 2023-11-27 DIAGNOSIS — K219 Gastro-esophageal reflux disease without esophagitis: Secondary | ICD-10-CM

## 2023-12-04 ENCOUNTER — Telehealth: Payer: Self-pay

## 2023-12-04 NOTE — Telephone Encounter (Signed)
 Copied from CRM [id number]. Topic: Clinical - Medication Question >> Dec 04, 2023 11:27 AM Elita Guitar wrote: Reason for CRM: Nita from [facility] called and stated that [facility] has dispensed both Trulicity  at 1.5 mg and 0.75 mg and she questioned If it is supposed to be either one or both. If one, she asked if the doctor could call the pharmacy and discontinue it. For questions, her  Direct line is: [phone]. Please advise.

## 2023-12-04 NOTE — Telephone Encounter (Signed)
 Patient had to restart on 0.75 mg but is now on 1.5 mg as prescribed.

## 2024-01-07 ENCOUNTER — Other Ambulatory Visit: Payer: Self-pay | Admitting: Family Medicine

## 2024-01-11 ENCOUNTER — Ambulatory Visit (INDEPENDENT_AMBULATORY_CARE_PROVIDER_SITE_OTHER): Admitting: Family Medicine

## 2024-01-11 ENCOUNTER — Encounter: Payer: Self-pay | Admitting: Family Medicine

## 2024-01-11 VITALS — BP 120/60 | HR 94 | Resp 16 | Ht 65.0 in | Wt 157.1 lb

## 2024-01-11 DIAGNOSIS — E1142 Type 2 diabetes mellitus with diabetic polyneuropathy: Secondary | ICD-10-CM | POA: Diagnosis not present

## 2024-01-11 DIAGNOSIS — K219 Gastro-esophageal reflux disease without esophagitis: Secondary | ICD-10-CM | POA: Diagnosis not present

## 2024-01-11 DIAGNOSIS — Z7985 Long-term (current) use of injectable non-insulin antidiabetic drugs: Secondary | ICD-10-CM

## 2024-01-11 DIAGNOSIS — I10 Essential (primary) hypertension: Secondary | ICD-10-CM

## 2024-01-11 DIAGNOSIS — G63 Polyneuropathy in diseases classified elsewhere: Secondary | ICD-10-CM

## 2024-01-11 DIAGNOSIS — Z7984 Long term (current) use of oral hypoglycemic drugs: Secondary | ICD-10-CM

## 2024-01-11 LAB — POCT GLYCOSYLATED HEMOGLOBIN (HGB A1C): Hemoglobin A1C: 7.9 % — AB (ref 4.0–5.6)

## 2024-01-11 MED ORDER — GABAPENTIN 100 MG PO CAPS: 100.0000 mg | ORAL_CAPSULE | Freq: Every day | ORAL | 2 refills | Status: DC

## 2024-01-11 MED ORDER — PANTOPRAZOLE SODIUM 20 MG PO TBEC: 20.0000 mg | DELAYED_RELEASE_TABLET | Freq: Every day | ORAL | 2 refills | Status: AC

## 2024-01-11 NOTE — Patient Instructions (Addendum)
 A few things to remember from today's visit:  Type 2 diabetes mellitus with diabetic polyneuropathy, without long-term current use of insulin  (HCC) - Plan: POC HgB A1c  Essential hypertension  Polyneuropathy associated with underlying disease (HCC) - Plan: gabapentin  (NEURONTIN ) 100 MG capsule  Gastroesophageal reflux disease, unspecified whether esophagitis present - Plan: pantoprazole  (PROTONIX ) 20 MG tablet Pantoprazole  30 min before breakfast. No changes in rest.  Try to avoid sugar. Change bread to whole wheat. Let me know if blood sugar improved in 4 weeks.  If you need refills for medications you take chronically, please call your pharmacy. Do not use My Chart to request refills or for acute issues that need immediate attention. If you send a my chart message, it may take a few days to be addressed, specially if I am not in the office.  Please be sure medication list is accurate. If a new problem present, please set up appointment sooner than planned today.

## 2024-01-11 NOTE — Assessment & Plan Note (Signed)
 Having dyspepsia like symptoms associated with consumption of spicy food. In the past she has been on pantoprazole  40 mg, with help, agrees with resuming medication at 20 mg before breakfast. GERD precautions also recommended.

## 2024-01-11 NOTE — Progress Notes (Signed)
 HPI: [name] is a 88 y.o. female with a history of varicose veins bilaterally, DM II,HTN, macular degeneration, mesenteric artery atherosclerosis,and GERD, who is here today with her daughter for chronic disease management.  Last seen on 10/09/2023  Diabetes Mellitus II: Dx'ed at age 29 - Per Freestyle Libre, she has had reading in the high 200s with a few readings above 300. She is at goal 67% of the goal, between 180-250 30% of the time and 3% above 250.  - Currently on Trulicity  1.5 mg once weekly and Synjardy  12.5-500 mg BID.  - Good compliance and tolerance.  - Diet: Not eating sufficient amounts of protein for breakfast. Had whole wheat bread and a small banana today.  - She eats candy when she is having a chocolate fit and also has life savers and jolly ranchers. - Her daughter reports she eats cinnamon raisin bread, honey bread, and croissants.  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  - Eye doctor: She plans to schedule an appt soon.  - Negative for symptoms of hypoglycemia, polyuria, polydipsia, chest pain, dyspnea, unusual headaches, or foot ulcers/trauma.  Lab Results  Component Value Date   HGBA1C 7.9 (A) 01/11/2024   Lab Results  Component Value Date   MICROALBUR 1.3 04/27/2023    GERD: She endorses heartburn, gas, and belching.  Exacerbated by spicy food. She was previously taking Pantoprazole , which was discontinued after last hospitalization in 07/2023. She has not resumed Pantoprazole .   Neuropathy Compliant with Gabapentin  100 mg daily for neuropathy.  Good compliance and tolerance.  Her believes her Gabapentin  has helped, but she states she is at times she still has burning sensation, in which case she takes an extra gabapentin  cap, not very frequent.  HTN on non pharmacologic treatment. Denies severe/frequent headache, visual changes, chest pain,  dyspnea, palpitation, focal weakness, or edema. Lab Results  Component Value Date   NA 141 08/26/2023   CL 103 08/26/2023   K 3.9 08/26/2023   CO2 28 08/26/2023   BUN 11 08/26/2023   CREATININE 0.86 08/26/2023   GFR 61.60 08/26/2023   CALCIUM  8.7 08/26/2023   PHOS 2.9 04/15/2023   ALBUMIN 4.1 08/26/2023   GLUCOSE 142 (H) 08/26/2023   Review of Systems  Constitutional:  Negative for activity change and appetite change.  HENT:  Negative for sore throat and trouble swallowing.   Respiratory:  Negative for cough and wheezing.   Gastrointestinal:  Negative for abdominal pain, nausea and vomiting.  Genitourinary:  Negative for decreased urine volume, dysuria and hematuria.  Musculoskeletal:  Positive for arthralgias and gait problem.  Skin:  Negative for rash.  Neurological:  Negative for syncope and facial asymmetry.  Psychiatric/Behavioral:  Negative for confusion and hallucinations.   See other pertinent positives and negatives in HPI.  Current Outpatient Medications on File Prior to Visit  Medication Sig Dispense Refill   albuterol  (VENTOLIN  HFA) 108 (90 Base) MCG/ACT inhaler TAKE 2 PUFFS BY MOUTH EVERY 6 HOURS AS NEEDED FOR WHEEZE OR SHORTNESS OF BREATH 8.5 each 2   Continuous Glucose Receiver (FREESTYLE LIBRE 3 READER) DEVI Use to check blood sugar 1 each 0   Continuous Glucose Sensor (FREESTYLE LIBRE 3 PLUS SENSOR) MISC Change sensor every 15 days. 3 each 3   Dulaglutide  (TRULICITY ) 1.5 MG/0.5ML SOAJ Inject 1.5 mg into the skin once a week. 6 mL 2   Elastic Bandages & Supports (  MEDICAL COMPRESSION THIGH HIGH) MISC 1 each by Does not apply route daily. 2 each 0   Empagliflozin -metFORMIN  HCl (SYNJARDY ) 12.5-500 MG TABS Take 1 tablet by mouth 2 (two) times daily with a meal. 180 tablet 2   Multiple Vitamins-Minerals (PRESERVISION AREDS) CAPS Take 1 capsule by mouth daily.     rosuvastatin  (CRESTOR ) 5 MG tablet Take 5 mg by mouth daily.     [DISCONTINUED] cetirizine (ZYRTEC) 10 MG  tablet Take 10 mg by mouth daily as needed for allergies.     No current facility-administered medications on file prior to visit.    Past Medical History:  Diagnosis Date   Allergy    Cancer [facility])    endometrial   Cataract    Diabetes mellitus without complication (HCC)    type 2   GERD (gastroesophageal reflux disease)    High cholesterol    Jaundice    as a child   Macular degeneration    Nuclear sclerotic cataract of both eyes 09/05/2020   Varicose veins of lower extremity    right leg   Allergies  Allergen Reactions   Ammonia     Unknown rxn.   Codeine Other (See Comments)    Causes lip burning and hyperactivity    Social History   Socioeconomic History   Marital status: Widowed    Spouse name: Not on file   Number of children: 1   Years of education: Not on file   Highest education level: Not on file  Occupational History   Occupation: retired  Tobacco Use   Smoking status: Former    Current packs/day: 0.00    Types: Cigarettes    Quit date: 03/26/2009    Years since quitting: 14.8   Smokeless tobacco: Never  Vaping Use   Vaping status: Never Used  Substance and Sexual Activity   Alcohol use: No   Drug use: No   Sexual activity: Not Currently  Other Topics Concern   Not on file  Social History Narrative   3 Step Daughters/lives with Daughter(Barbara Golden) and son-in-law.   Social Drivers of Corporate investment banker Strain: Low Risk  (03/03/2023)   Overall Financial Resource Strain (CARDIA)    Difficulty of Paying Living Expenses: Not hard at all  Food Insecurity: No Food Insecurity (08/22/2023)   Hunger Vital Sign    Worried About Running Out of Food in the Last Year: Never true    Ran Out of Food in the Last Year: Never true  Transportation Needs: No Transportation Needs (08/22/2023)   PRAPARE - Administrator, Civil Service (Medical): No    Lack of Transportation (Non-Medical): No  Physical Activity: Insufficiently Active (03/03/2023)    Exercise Vital Sign    Days of Exercise per Week: 7 days    Minutes of Exercise per Session: 10 min  Stress: No Stress Concern Present (03/03/2023)   [employer] of Occupational Health - Occupational Stress Questionnaire    Feeling of Stress : Not at all  Social Connections: Socially Isolated (08/22/2023)   Social Connection and Isolation Panel    Frequency of Communication with Friends and Family: Never    Frequency of Social Gatherings with Friends and Family: Never    Attends Religious Services: Never    Database administrator or Organizations: No    Attends Banker Meetings: Never    Marital Status: Widowed    Vitals:   01/11/24 1324  BP: 120/60  Pulse: 94  Resp: 16  SpO2: 97%   Body mass index is 26.15 kg/m.  Physical Exam Vitals and nursing note reviewed.  Constitutional:      General: She is not in acute distress.    Appearance: She is well-developed.  HENT:     Head: Normocephalic and atraumatic.     Mouth/Throat:     Mouth: Mucous membranes are moist.   Eyes:     Conjunctiva/sclera: Conjunctivae normal.    Cardiovascular:     Rate and Rhythm: Normal rate and regular rhythm.     Pulses:          Posterior tibial pulses are 2+ on the right side and 2+ on the left side.     Heart sounds: No murmur heard. Pulmonary:     Effort: Pulmonary effort is normal. No respiratory distress.     Breath sounds: Normal breath sounds.  Abdominal:     Palpations: Abdomen is soft. There is no mass.     Tenderness: There is no abdominal tenderness.   Musculoskeletal:     Right lower leg: No edema.     Left lower leg: No edema.   Skin:    General: Skin is warm.     Findings: No erythema or rash.   Neurological:     General: No focal deficit present.     Mental Status: She is alert and oriented to person, place, and time.     Comments: Unstable gait assisted with a cane.  Psychiatric:        Mood and Affect: Mood and affect normal.    ASSESSMENT  AND PLAN: Ms. PUALANI BORAH was seen today for chronic disease management.   Orders Placed This Encounter  Procedures   POC HgB A1c    Type 2 diabetes mellitus with diabetic polyneuropathy, without long-term current use of insulin  (HCC) Assessment & Plan: HgA1C has improved but still not at goal.  Hemoglobin A1c went from 8.3 to 7.9. Currently she is on Trulicity  1.5 mg weekly and empagliflozin -metformin  12.5-500 mg twice daily. We discussed other medications options, recommend basal insulin . Her daughter thinks she has some room for dietary improvement, she is a few sweets per day. She promised to try to decrease some sweets/sugar intake and prefers not to have medications added today. She was instructed to let me know about BS in about 4 weeks. Annual eye exam, periodic dental and foot care recommended. F/U in 4 months.  Orders: -     POCT glycosylated hemoglobin (Hb A1C)  Essential hypertension Assessment & Plan: Problem is adequately controlled. Continue nonpharmacologic treatment. She is due for eye exam.   Polyneuropathy associated with underlying disease (HCC) Assessment & Plan: Problem is otherwise stable, sometimes she takes an extra gabapentin  100 mg for burning sensation. She prefers to continue current regimen, so increase amount of gabapentin  per month by 10 capsules to continue 1-2 caps at bedtime as needed. Continue appropriate footcare.  Orders: -     Gabapentin ; Take 1-2 capsules (100-200 mg total) by mouth at bedtime.  Dispense: 40 capsule; Refill: 2  Gastroesophageal reflux disease, unspecified whether esophagitis present Assessment & Plan: Having dyspepsia like symptoms associated with consumption of spicy food. In the past she has been on pantoprazole  40 mg, with help, agrees with resuming medication at 20 mg before breakfast. GERD precautions also recommended.  Orders: -     Pantoprazole  Sodium; Take 1 tablet (20 mg total) by mouth daily before  breakfast.  Dispense: 90 tablet; Refill:  2  I spent a total of 45 minutes in both face to face and non face to face activities for this visit on the date of this encounter. During this time history was obtained and documented, examination was performed, prior labs reviewed, and assessment/plan discussed.  Return in about 4 months (around 05/12/2024) for chronic problems.  I, Vernell Forest, acting as a scribe for Jonahtan Manseau [place], MD., have documented all relevant documentation on the behalf of Evah Rashid [place], MD, as directed by   while in the presence of Welcome Fults [place], MD.  I, Davison Ohms [place], MD, have reviewed all documentation for this visit. The documentation on 01/11/24 for the exam, diagnosis, procedures, and orders are all accurate and complete.  Sorina Derrig G. [place], MD  [facility]. [place] office.

## 2024-01-11 NOTE — Assessment & Plan Note (Signed)
 HgA1C has improved but still not at goal.  Hemoglobin A1c went from 8.3 to 7.9. Currently she is on Trulicity  1.5 mg weekly and empagliflozin -metformin  12.5-500 mg twice daily. We discussed other medications options, recommend basal insulin . Her daughter thinks she has some room for dietary improvement, she is a few sweets per day. She promised to try to decrease some sweets/sugar intake and prefers not to have medications added today. She was instructed to let me know about BS in about 4 weeks. Annual eye exam, periodic dental and foot care recommended. F/U in 4 months.

## 2024-01-11 NOTE — Assessment & Plan Note (Addendum)
 Problem is otherwise stable, sometimes she takes an extra gabapentin  100 mg for burning sensation. She prefers to continue current regimen, so increase amount of gabapentin  per month by 10 capsules to continue 1-2 caps at bedtime as needed. Continue appropriate footcare.

## 2024-01-11 NOTE — Assessment & Plan Note (Signed)
 Problem is adequately controlled. Continue nonpharmacologic treatment. She is due for eye exam.

## 2024-02-10 ENCOUNTER — Telehealth: Payer: Self-pay

## 2024-02-10 NOTE — Telephone Encounter (Signed)
 Copied from CRM [id number]. Topic: Clinical - Medication Question >> Feb 10, 2024  1:39 PM Viola F wrote: Patient daughter Sharlet called, wants to know which dose of the Trulicity  medication should patient be on? 1.25mg  or another dose? She requested a response via MyChart

## 2024-02-16 ENCOUNTER — Other Ambulatory Visit: Payer: Self-pay | Admitting: Family Medicine

## 2024-02-16 DIAGNOSIS — K219 Gastro-esophageal reflux disease without esophagitis: Secondary | ICD-10-CM

## 2024-03-12 ENCOUNTER — Emergency Department (HOSPITAL_COMMUNITY)
Admission: EM | Admit: 2024-03-12 | Discharge: 2024-03-12 | Disposition: A | Discharge status: Home / Self Care | Attending: Emergency Medicine | Admitting: Emergency Medicine

## 2024-03-12 ENCOUNTER — Other Ambulatory Visit: Payer: Self-pay

## 2024-03-12 ENCOUNTER — Encounter (HOSPITAL_COMMUNITY): Payer: Self-pay

## 2024-03-12 DIAGNOSIS — S80812A Abrasion, left lower leg, initial encounter: Secondary | ICD-10-CM | POA: Diagnosis not present

## 2024-03-12 DIAGNOSIS — S80811A Abrasion, right lower leg, initial encounter: Secondary | ICD-10-CM | POA: Insufficient documentation

## 2024-03-12 DIAGNOSIS — M25572 Pain in left ankle and joints of left foot: Secondary | ICD-10-CM | POA: Diagnosis not present

## 2024-03-12 DIAGNOSIS — E119 Type 2 diabetes mellitus without complications: Secondary | ICD-10-CM | POA: Diagnosis not present

## 2024-03-12 DIAGNOSIS — W5503XA Scratched by cat, initial encounter: Secondary | ICD-10-CM | POA: Insufficient documentation

## 2024-03-12 MED ORDER — AZITHROMYCIN 250 MG PO TABS: 250.0000 mg | ORAL_TABLET | Freq: Every day | ORAL | 0 refills | Status: DC

## 2024-03-12 MED ORDER — AZITHROMYCIN 600 MG PO TABS: 600.0000 mg | ORAL_TABLET | Freq: Once | ORAL | Status: DC

## 2024-03-12 MED ORDER — AZITHROMYCIN 250 MG PO TABS
500.0000 mg | ORAL_TABLET | Freq: Once | ORAL | Status: AC
  Administered 2024-03-12: 500 mg via ORAL
  Filled 2024-03-12: qty 2

## 2024-03-12 NOTE — ED Triage Notes (Signed)
 Pt came in for left ankle pain and redness around both ankles. Pt stated she has multiple cuts around the area from cat wounds and now she has a burning sensation with touch.

## 2024-03-12 NOTE — ED Provider Notes (Signed)
 [place] EMERGENCY DEPARTMENT AT [facility] Provider Note   CSN: [id number] Arrival date & time: 03/12/24  1520     Patient presents with: Foot Pain   Barbara Golden is a 88 y.o. female patient with history of diabetes who presents to the emergency department today for further evaluation of left ankle pain.  Symptoms started over the last couple of days.  Patient states she has 3 kittens at home and they have been climbing all over her legs and scratching her.  She denies any drainage from the wounds, fever, chills, redness, or warmth to the lower extremities.  No chest pain or shortness of breath.    Foot Pain       Prior to Admission medications   Medication Sig Start Date End Date Taking? Authorizing Provider  azithromycin  (ZITHROMAX ) 250 MG tablet Take 1 tablet (250 mg total) by mouth daily. Take 1 every day until finished. 03/12/24  Yes Darya Bigler M, PA-C  albuterol  (VENTOLIN  HFA) 108 (90 Base) MCG/ACT inhaler TAKE 2 PUFFS BY MOUTH EVERY 6 HOURS AS NEEDED FOR WHEEZE OR SHORTNESS OF BREATH 01/07/24   [place], Betty G, MD  Continuous Glucose Receiver (FREESTYLE LIBRE 3 READER) DEVI Use to check blood sugar 09/14/23   [place], Betty G, MD  Continuous Glucose Sensor (FREESTYLE LIBRE 3 PLUS SENSOR) MISC Change sensor every 15 days. 09/14/23   [place], Betty G, MD  Dulaglutide  (TRULICITY ) 1.5 MG/0.5ML SOAJ Inject 1.5 mg into the skin once a week. 11/10/23   [place], Betty G, MD  Elastic Bandages & Supports (MEDICAL COMPRESSION THIGH HIGH) MISC 1 each by Does not apply route daily. 04/12/20   Wieters, Hallie C, PA-C  Empagliflozin -metFORMIN  HCl (SYNJARDY ) 12.5-500 MG TABS Take 1 tablet by mouth 2 (two) times daily with a meal. 10/09/23   [place], Betty G, MD  gabapentin  (NEURONTIN ) 100 MG capsule Take 1-2 capsules (100-200 mg total) by mouth at bedtime. 01/11/24   [place], Betty G, MD  Multiple Vitamins-Minerals (PRESERVISION AREDS) CAPS Take 1 capsule by mouth daily. 08/21/20    [provider]  pantoprazole  (PROTONIX ) 20 MG tablet Take 1 tablet (20 mg total) by mouth daily before breakfast. 01/11/24   [place], Betty G, MD  rosuvastatin  (CRESTOR ) 5 MG tablet Take 5 mg by mouth daily. 08/17/20   [provider]  cetirizine (ZYRTEC) 10 MG tablet Take 10 mg by mouth daily as needed for allergies.  09/13/19  [provider]    Allergies: Ammonia and Codeine    Review of Systems  All other systems reviewed and are negative.   Updated Vital Signs BP 136/62   Pulse 85   Temp 98 F (36.7 C) (Oral)   Resp 17   LMP 07/21/1998 (Approximate)   SpO2 96%   Physical Exam Vitals and nursing note reviewed.  Constitutional:      Appearance: Normal appearance.  HENT:     Head: Normocephalic and atraumatic.  Eyes:     General:        Right eye: No discharge.        Left eye: No discharge.     Conjunctiva/sclera: Conjunctivae normal.  Pulmonary:     Effort: Pulmonary effort is normal.  Musculoskeletal:     Comments: 2+ dorsalis pedis pulse felt bilaterally.  Equal. Sensation is intact.   Skin:    General: Skin is warm and dry.     Findings: No rash.     Comments: Multiple punctate abrasions scattered to the bilateral  lower extremities going up to the knees bilaterally.  No obvious erythema.  Legs do not appear cellulitic.  Not actively draining.  Neurological:     General: No focal deficit present.     Mental Status: She is alert.  Psychiatric:        Mood and Affect: Mood normal.        Behavior: Behavior normal.     (all labs ordered are listed, but only abnormal results are displayed) Labs Reviewed - No data to display  EKG: None  Radiology: No results found.   Procedures   Medications Ordered in the ED  azithromycin  (ZITHROMAX ) tablet 500 mg (has no administration in time range)       Medical Decision Making Barbara Golden is a 88 y.o. female patient who presents to the emergency department today for further  evaluation of left ankle pain.  Ankle does not appear to be cellulitic.  Low suspicion for septic joint.  She is hemodynamically stable.  She does have some scattered punctate abrasions likely from cats.  Given that the patient is diabetic, I will place her on a short course of azithromycin  to cover for cat scratch fever.  Again low suspicion for this at this time.  Strict return precautions were discussed.  Patient safe for discharge at this time.      Final diagnoses:  Acute left ankle pain    ED Discharge Orders          Ordered    azithromycin  (ZITHROMAX ) 250 MG tablet  Daily        03/12/24 1622               Theotis Peers [place], [place] 03/12/24 1622    Patsey Lot, MD 03/12/24 [phone]

## 2024-03-12 NOTE — Discharge Instructions (Signed)
 As we discussed, your legs do not appear to be infected today.  However, given your diabetes I will give you 4 days worth of azithromycin .  Please take 1 daily as prescribed.  I would like for you to follow-up with your primary care doctor for further evaluation.  You may return to the emergency room for any worsening symptoms like we discussed.

## 2024-03-16 ENCOUNTER — Ambulatory Visit (INDEPENDENT_AMBULATORY_CARE_PROVIDER_SITE_OTHER): Admitting: Family Medicine

## 2024-03-16 ENCOUNTER — Ambulatory Visit (INDEPENDENT_AMBULATORY_CARE_PROVIDER_SITE_OTHER)
Admission: RE | Admit: 2024-03-16 | Discharge: 2024-03-16 | Disposition: A | Discharge status: Home / Self Care | Source: Ambulatory Visit | Attending: Family Medicine | Admitting: Family Medicine

## 2024-03-16 ENCOUNTER — Encounter: Payer: Self-pay | Admitting: Family Medicine

## 2024-03-16 VITALS — BP 120/70 | HR 97 | Resp 16 | Ht 65.0 in | Wt 157.1 lb

## 2024-03-16 DIAGNOSIS — M25572 Pain in left ankle and joints of left foot: Secondary | ICD-10-CM | POA: Diagnosis not present

## 2024-03-16 DIAGNOSIS — E1142 Type 2 diabetes mellitus with diabetic polyneuropathy: Secondary | ICD-10-CM | POA: Diagnosis not present

## 2024-03-16 DIAGNOSIS — Z794 Long term (current) use of insulin: Secondary | ICD-10-CM | POA: Diagnosis not present

## 2024-03-16 DIAGNOSIS — M79672 Pain in left foot: Secondary | ICD-10-CM | POA: Diagnosis not present

## 2024-03-16 DIAGNOSIS — M2012 Hallux valgus (acquired), left foot: Secondary | ICD-10-CM | POA: Diagnosis not present

## 2024-03-16 DIAGNOSIS — M7732 Calcaneal spur, left foot: Secondary | ICD-10-CM | POA: Diagnosis not present

## 2024-03-16 DIAGNOSIS — M19072 Primary osteoarthritis, left ankle and foot: Secondary | ICD-10-CM | POA: Diagnosis not present

## 2024-03-16 DIAGNOSIS — R059 Cough, unspecified: Secondary | ICD-10-CM | POA: Diagnosis not present

## 2024-03-16 DIAGNOSIS — M7989 Other specified soft tissue disorders: Secondary | ICD-10-CM | POA: Diagnosis not present

## 2024-03-16 DIAGNOSIS — R609 Edema, unspecified: Secondary | ICD-10-CM | POA: Diagnosis not present

## 2024-03-16 MED ORDER — BASAGLAR KWIKPEN 100 UNIT/ML ~~LOC~~ SOPN: 10.0000 [IU] | PEN_INJECTOR | Freq: Every day | SUBCUTANEOUS | 1 refills | Status: AC

## 2024-03-16 MED ORDER — DICLOFENAC SODIUM 1 % EX GEL: 2.0000 g | Freq: Four times a day (QID) | CUTANEOUS | 1 refills | Status: AC

## 2024-03-16 NOTE — Patient Instructions (Addendum)
 A few things to remember from today's visit:  Acute left ankle pain - Plan: DG Ankle Complete Left, diclofenac  Sodium (VOLTAREN ) 1 % GEL  Left foot pain - Plan: Ambulatory referral to Podiatry, DG Foot Complete Left  Type 2 diabetes mellitus with diabetic polyneuropathy, without long-term current use of insulin  (HCC) - Plan: Insulin  Glargine (BASAGLAR  KWIKPEN) 100 UNIT/ML  Basaglar  10 U daily added today for blood sugars. Topical voltaren . Complete antibiotic treatment. X rays and [place]am today.  If you need refills for medications you take chronically, please call your pharmacy. Do not use My Chart to request refills or for acute issues that need immediate attention. If you send a my chart message, it may take a few days to be addressed, specially if I am not in the office.  Please be sure medication list is accurate. If a new problem present, please set up appointment sooner than planned today.

## 2024-03-16 NOTE — Assessment & Plan Note (Signed)
 Last hemoglobin A1c 7.9 on 01/11/2024. Most BS above 250, some in the 300s. Currently she is on Trulicity  1.5 mg weekly and Synjardy  12.5-500 mg twice daily. We discussed options at this time, including Novolin per sliding scale and basal insulin , she agrees with starting Basaglar  at 10 units daily. Caution with hypoglycemic events. Dietary recommendations reviewed. F/U in 04/2024.

## 2024-03-16 NOTE — Progress Notes (Signed)
 ACUTE VISIT Chief Complaint  Patient presents with   Hospitalization Follow-up   HPI: Barbara Golden is a 88 y.o. female with a PMHx significant for varicose veins bilaterally, DM II,HTN, macular degeneration, mesenteric artery atherosclerosis,and GERD, who is here today, accompanied by her daughter, for ED follow-up for foot pain.  Presented to the ED for evaluation of her left ankle pain.  Physical exam noted multiple scattered punctuate abrasions on her lower extremities, which she had attributed to her 3 kittens, but there was no erythema, cellulitis, or drainage visualized.  She was given a dose of Azithromycin  500 mg in ED and discharged with Azithromycin  250 mg #4 tablets to take daily to treat any possible infection due to her diabetic status. Today, she says that prior to presentation, her left ankle was extremely tender to palpation laterally with redness extending upwards from ankle.  Along with those symptoms, she did also experience chills, but denies fever or changes in appetite. She normally wears compression stocking, but has not recently to allow for circulation. No personal history of Gout and no history of trauma. She denies any groin pain of masses.  She reports productive cough, not able to bring up sputum. No associated dyspnea or wheezing. It has improved with azithromycin . Did not check for Covid 19 infection. States that her darra was sick with respiratory symptoms while visiting.  Her daughter is concerned about elevated BS.  Diabetes Mellitus, type II, which was diagnosed at age 64. - Checking BG at home: Libre CGM with recent BG avg 70-180, but occasional 200s-300  - Medications: Trulicity  1.5 mg injected weekly and Synjardy  12.5-500 mg twice daily. - Negative for symptoms of hypoglycemia, polyuria, polydipsia, foot ulcers; +trauma via cat scratches. Hx of Peripheral Neuropathy. Her toenails are overgrown and they have been trying to  establish with Podiatry.  Lab Results  Component Value Date   HGBA1C 7.9 (A) 01/11/2024   Lab Results  Component Value Date   MICROALBUR 0.6 05/04/2020   Review of Systems  Constitutional:  Negative for activity change and fever.  HENT:  Positive for sore throat (mild, aggravated by cough). Negative for trouble swallowing.   Cardiovascular:  Negative for chest pain and palpitations.  Gastrointestinal:  Negative for abdominal pain, nausea and vomiting.  Genitourinary:  Negative for decreased urine volume, dysuria and hematuria.  Musculoskeletal:  Positive for arthralgias and gait problem.  Skin:  Negative for rash.  Neurological:  Negative for syncope, weakness and headaches.  Psychiatric/Behavioral:  Negative for confusion and hallucinations.   See other pertinent positives and negatives in HPI.  Current Outpatient Medications on File Prior to Visit  Medication Sig Dispense Refill   albuterol  (VENTOLIN  HFA) 108 (90 Base) MCG/ACT inhaler TAKE 2 PUFFS BY MOUTH EVERY 6 HOURS AS NEEDED FOR WHEEZE OR SHORTNESS OF BREATH 8.5 each 2   azithromycin  (ZITHROMAX ) 250 MG tablet Take 1 tablet (250 mg total) by mouth daily. Take 1 every day until finished. 4 tablet 0   Continuous Glucose Receiver (FREESTYLE LIBRE 3 READER) DEVI Use to check blood sugar 1 each 0   Continuous Glucose Sensor (FREESTYLE LIBRE 3 PLUS SENSOR) MISC Change sensor every 15 days. 3 each 3   Dulaglutide  (TRULICITY ) 1.5 MG/0.5ML SOAJ Inject 1.5 mg into the skin once a week. 6 mL 2   Elastic Bandages & Supports (MEDICAL COMPRESSION THIGH HIGH) MISC 1 each by Does not apply route daily. 2 each 0   Empagliflozin -metFORMIN  HCl (SYNJARDY ) 12.5-500 MG TABS Take 1  tablet by mouth 2 (two) times daily with a meal. 180 tablet 2   gabapentin  (NEURONTIN ) 100 MG capsule Take 1-2 capsules (100-200 mg total) by mouth at bedtime. 40 capsule 2   Multiple Vitamins-Minerals (PRESERVISION AREDS) CAPS Take 1 capsule by mouth daily.      pantoprazole  (PROTONIX ) 20 MG tablet Take 1 tablet (20 mg total) by mouth daily before breakfast. 90 tablet 2   rosuvastatin  (CRESTOR ) 5 MG tablet Take 5 mg by mouth daily.     [DISCONTINUED] cetirizine (ZYRTEC) 10 MG tablet Take 10 mg by mouth daily as needed for allergies.     No current facility-administered medications on file prior to visit.   Past Medical History:  Diagnosis Date   Allergy    [facility]cer [facility])    endometrial   Cataract    Diabetes mellitus without complication (HCC)    type 2   GERD (gastroesophageal reflux disease)    High cholesterol    Jaundice    as a child   Macular degeneration    Nuclear sclerotic cataract of both eyes 09/05/2020   Varicose veins of lower extremity    right leg   Allergies  Allergen Reactions   Ammonia     Unknown rxn.   Codeine Other (See Comments)    Causes lip burning and hyperactivity    Social History   Socioeconomic History   Marital status: Widowed    Spouse name: Not on file   Number of children: 1   Years of education: Not on file   Highest education level: Not on file  Occupational History   Occupation: retired  Tobacco Use   Smoking status: Former    Current packs/day: 0.00    Types: Cigarettes    Quit date: 03/26/2009    Years since quitting: 14.9   Smokeless tobacco: Never  Vaping Use   Vaping status: Never Used  Substance and Sexual Activity   Alcohol use: No   Drug use: No   Sexual activity: Not Currently  Other Topics Concern   Not on file  Social History Narrative   3 Step Daughters/lives with Daughter(Pam Bouwer) and son-in-law.   Social Drivers of Corporate investment banker Strain: Low Risk  (03/03/2023)   Overall Financial Resource Strain (CARDIA)    Difficulty of Paying Living Expenses: Not hard at all  Food Insecurity: No Food Insecurity (08/22/2023)   Hunger Vital Sign    Worried About Running Out of Food in the Last Year: Never true    Ran Out of Food in the Last Year: Never true   Transportation Needs: No Transportation Needs (08/22/2023)   PRAPARE - Administrator, Civil Service (Medical): No    Lack of Transportation (Non-Medical): No  Physical Activity: Insufficiently Active (03/03/2023)   Exercise Vital Sign    Days of Exercise per Week: 7 days    Minutes of Exercise per Session: 10 min  Stress: No Stress Concern Present (03/03/2023)   [employer] of Occupational Health - Occupational Stress Questionnaire    Feeling of Stress : Not at all  Social Connections: Socially Isolated (08/22/2023)   Social Connection and Isolation Panel    Frequency of Communication with Friends and Family: Never    Frequency of Social Gatherings with Friends and Family: Never    Attends Religious Services: Never    Database administrator or Organizations: No    Attends Banker Meetings: Never    Marital Status: Widowed  Vitals:   03/16/24 1028  BP: 120/70  Pulse: 97  Resp: 16  SpO2: 96%   Body mass index is 26.15 kg/m.  Physical Exam Vitals and nursing note reviewed.  Constitutional:      General: She is not in acute distress.    Appearance: She is well-developed.  HENT:     Head: Normocephalic and atraumatic.     Mouth/Throat:     Mouth: Mucous membranes are moist.  Eyes:     Conjunctiva/sclera: Conjunctivae normal.  Cardiovascular:     Rate and Rhythm: Normal rate and regular rhythm.     Heart sounds: No murmur heard.    Comments: DP palpable, good capillary refill. No left calf pain or erythema. Pulmonary:     Effort: Pulmonary effort is normal. No respiratory distress.     Breath sounds: Normal breath sounds.  Musculoskeletal:     Left ankle: Tenderness present over the posterior TF ligament. No proximal fibula tenderness.     Left foot: Normal range of motion and normal capillary refill. Tenderness present.       Feet:     Comments: Pain response also elicited  tuning fork at lateral malleolus, but no pain with toe movement.    Feet:     Right foot:     Toenail Condition: Right toenails are long.     Left foot:     Skin integrity: No erythema or warmth.     Toenail Condition: Left toenails are long.  Skin:    General: Skin is warm.     Findings: No erythema or rash.     Comments: Multiple very superficial scratching signs on distal LE's, healing well.  Neurological:     General: No focal deficit present.     Mental Status: She is alert and oriented to person, place, and time.     Cranial Nerves: No cranial nerve deficit.     Gait: Gait normal.     Comments: Unstable gait assisted with a cane.  Psychiatric:        Mood and Affect: Mood and affect normal.    ASSESSMENT AND PLAN: Ms.Kamorah L Cunanan was seen here today for foot pain.   Acute left ankle pain No history of trauma. ?  Ankle sprain. Problem has improved some.   Recommend topical Voltaren  gel up to 4 times per day. Fall precautions discussed.  -     DG Ankle Complete Left; Future -     Diclofenac  Sodium; Apply 2 g topically 4 (four) times daily.  Dispense: 150 g; Refill: 1  Left foot pain Very tender with palpation of lateral tarsus. No signs of infectious process. She has history of peripheral neuropathy, so even with no history of trauma, fracture needs to be considered. Foot x-ray ordered today.  -     Ambulatory referral to Podiatry -     DG Foot Complete Left; Future  Type 2 diabetes mellitus with diabetic polyneuropathy, without long-term current use of insulin  [facility]) Assessment & Plan: Last hemoglobin A1c 7.9 on 01/11/2024. Most BS above 250, some in the 300s. Currently she is on Trulicity  1.5 mg weekly and Synjardy  12.5-500 mg twice daily. We discussed options at this time, including Novolin per sliding scale and basal insulin , she agrees with starting Basaglar  at 10 units daily. Caution with hypoglycemic events. Dietary recommendations reviewed. F/U in 04/2024.  Orders: -     Basaglar  KwikPen; Inject 10 Units into the  skin at bedtime.  Dispense: 6 mL;  Refill: 1  Cough, unspecified type Lung auscultation today negative. She reports improvement after starting azithromycin . For now I think we can hold on imaging. Instructed about warning signs.  I personally spent a total of 43 minutes in the care of the patient today including preparing to see the patient, getting/reviewing separately obtained history, performing a medically appropriate exam/evaluation, counseling and educating, placing orders, and documenting clinical information in the EHR.  Return in about 6 weeks (around 04/27/2024).  I,Emily Lagle,acting as a Neurosurgeon for Matisse Roskelley [place], MD.,have documented all relevant documentation on the behalf of Olis Viverette [place], MD,as directed by  Kecia Swoboda [place], MD while in the presence of Maurianna Benard [place], MD.  I, Laneshia Pina [place], MD, have reviewed all documentation for this visit. The documentation on 03/16/24 for the exam, diagnosis, procedures, and orders are all accurate and complete.  Kylen Ismael G. [place], MD  [facility]. [place] office.

## 2024-03-18 ENCOUNTER — Ambulatory Visit: Admitting: Podiatry

## 2024-03-18 ENCOUNTER — Encounter: Payer: Self-pay | Admitting: Podiatry

## 2024-03-18 DIAGNOSIS — B351 Tinea unguium: Secondary | ICD-10-CM

## 2024-03-18 DIAGNOSIS — M79672 Pain in left foot: Secondary | ICD-10-CM | POA: Diagnosis not present

## 2024-03-18 DIAGNOSIS — M79671 Pain in right foot: Secondary | ICD-10-CM | POA: Diagnosis not present

## 2024-03-18 DIAGNOSIS — M25572 Pain in left ankle and joints of left foot: Secondary | ICD-10-CM | POA: Diagnosis not present

## 2024-03-18 NOTE — Progress Notes (Signed)
 Patient presents for evaluation and treatment of tenderness and some redness around nails feet.  Tenderness around toes with walking and wearing shoes.  Some tenderness along the lateral ankle and anterior ankle left.  Her PCP Dr. [place] has ordered x-rays on the ankle.  Physical exam:  General appearance: Alert, pleasant, and in no acute distress.  Vascular: Pedal pulses: DP 2/4 B/L, PT 1/4 B/L.  Mild edema lower legs bilaterally.  Capillary refill time immediate bilateral  Neu  Dermatologic:  Nails thickened, disfigured, discolored 1-5 BL with subungual debris.  Redness and hypertrophic nail folds along nail folds bilaterally but no signs of drainage or infection.  Musculoskeletal:  Some tenderness palpation along the anterior ankle left.  Some tenderness lateral ankle along peroneal tendons.  Muscle strength lower extremity bilaterally   Diagnosis: 1. Painful onychomycotic nails 1 through 5 bilaterally. 2. Pain toes 1 through 5 bilaterally. 3.  Arthralgia ankle left  Plan: -New patient office visit for evaluation and management level 3.  Modifier 25. -Discussed with her the ankle pain for right now we will just let Dr. [place] take care of the ankle arthralgia however if she needs more advice we can certainly see her for that.  Discussed with her that it could possibly be gout from her description of it. -Debrided onychomycotic nails 1 through 5 bilaterally.  Sharply debrided nails with nail clipper and reduced with a power bur.  Return 3 months

## 2024-03-19 ENCOUNTER — Other Ambulatory Visit: Payer: Self-pay | Admitting: Family Medicine

## 2024-03-28 ENCOUNTER — Ambulatory Visit: Payer: Self-pay | Admitting: Family Medicine

## 2024-04-04 ENCOUNTER — Other Ambulatory Visit: Payer: Self-pay | Admitting: Family Medicine

## 2024-04-05 ENCOUNTER — Ambulatory Visit (INDEPENDENT_AMBULATORY_CARE_PROVIDER_SITE_OTHER): Admitting: Family Medicine

## 2024-04-05 ENCOUNTER — Encounter: Payer: Self-pay | Admitting: Family Medicine

## 2024-04-05 DIAGNOSIS — Z0000 Encounter for general adult medical examination without abnormal findings: Secondary | ICD-10-CM

## 2024-04-05 NOTE — Patient Instructions (Addendum)
 I really enjoyed getting to talk with you today! I am available on Tuesdays and Thursdays for virtual visits if you have any questions or concerns, or if I can be of any further assistance.   CHECKLIST FROM ANNUAL WELLNESS VISIT:  -Follow up (please call to schedule if not scheduled after visit):   -yearly for annual wellness visit with primary care office  Here is a list of your preventive care/health maintenance measures and the plan for each if any are due:  PLAN For any measures below that may be due:    1. Can get vaccines at the pharmacy, please let us  know if you do so that we can update your records.   2. Please schedule your eye exam.   Health Maintenance  Topic Date Due   Pneumococcal Vaccine: 50+ Years (1 of 2 - PCV) Never done   DEXA SCAN  Never done   COVID-19 Vaccine (3 - Moderna risk series) 09/29/2019   OPHTHALMOLOGY EXAM  06/26/2023   Influenza Vaccine  02/19/2024   Zoster Vaccines- Shingrix (1 of 2) 07/29/2024 (Originally 03/21/1955)   HEMOGLOBIN A1C  07/12/2024   FOOT EXAM  03/16/2025   Medicare Annual Wellness (AWV)  04/05/2025   DTaP/Tdap/Td (2 - Td or Tdap) 10/04/2028   HPV VACCINES  Aged Out   Meningococcal B Vaccine  Aged Out    -See a dentist at least yearly  -Get your eyes checked and then per your eye specialist's recommendations  -Other issues addressed today:   -I have included below further information regarding a healthy whole foods based diet, physical activity guidelines for adults, stress management and opportunities for social connections. I hope you find this information useful.   -----------------------------------------------------------------------------------------------------------------------------------------------------------------------------------------------------------------------------------------------------------    NUTRITION: -eat real food: lots of colorful vegetables (half the plate) and fruits -5-7 servings of  vegetables and fruits per day (fresh or steamed is best), exp. 2 servings of vegetables with lunch and dinner and 2 servings of fruit per day. Berries and greens such as kale and collards are great choices.  -consume on a regular basis:  fresh fruits, fresh veggies, fish, nuts, seeds, healthy oils (such as olive oil, avocado oil), whole grains (make sure for bread/pasta/crackers/etc., that the first ingredient on label contains the word whole), legumes. -can eat small amounts of dairy and lean meat (no larger than the palm of your hand), but avoid processed meats such as ham, bacon, lunch meat, etc. -drink water  -try to avoid fast food and pre-packaged foods, processed meat, ultra processed foods/beverages (donuts, candy, etc.) -most experts advise limiting sodium to < 2300mg  per day, should limit further is any chronic conditions such as high blood pressure, heart disease, diabetes, etc. The American Heart Association advised that < 1500mg  is is ideal -try to avoid foods/beverages that contain any ingredients with names you do not recognize  -try to avoid foods/beverages  with added sugar or sweeteners/sweets  -try to avoid sweet drinks (including diet drinks): soda, juice, Gatorade, sweet tea, power drinks, diet drinks -try to avoid white rice, white bread, pasta (unless whole grain)  EXERCISE GUIDELINES FOR ADULTS: -if you wish to increase your physical activity, do so gradually and with the approval of your doctor -STOP and seek medical care immediately if you have any chest pain, chest discomfort or trouble breathing when starting or increasing exercise  -move and stretch your body, legs, feet and arms when sitting for long periods -Physical activity guidelines for optimal health in adults: -get at least 150  minutes per week of moderate exercise (can talk, but not sing); this is about 20-30 minutes of sustained activity 5-7 days per week or two 10-15 minute episodes of sustained activity 5-7  days per week -do some muscle building/resistance training/strength training at least 2 days per week  -balance exercises 3+ days per week:   Stand somewhere where you have something sturdy to hold onto if you lose balance    1) lift up on toes, then back down, start with 5x per day and work up to 20x   2) stand and lift one leg straight out to the side so that foot is a few inches of the floor, start with 5x each side and work up to 20x each side   3) stand on one foot, start with 5 seconds each side and work up to 20 seconds on each side  If you need ideas or help with getting more active:  -Silver sneakers https://tools.silversneakers.com  -Walk with a Doc: http://www.duncan-williams.com/  -try to include resistance (weight lifting/strength building) and balance exercises twice per week: or the following link for ideas: http://castillo-powell.com/  BuyDucts.dk  STRESS MANAGEMENT: -can try meditating, or just sitting quietly with deep breathing while intentionally relaxing all parts of your body for 5 minutes daily -if you need further help with stress, anxiety or depression please follow up with your primary doctor or contact the wonderful folks at [facility]: [phone]  SOCIAL CONNECTIONS: -options in [place] if you wish to engage in more social and exercise related activities:  -Silver sneakers https://tools.silversneakers.com  -Walk with a Doc: http://www.duncan-williams.com/  -Check out the [facility] Active Adults 50+ section on the [place] of [employer] (hiking clubs, book clubs, cards and games, chess, exercise classes, aquatic classes and much more) - see the website for details: https://www.[place]-.gov/departments/parks-recreation/active-adults50  -YouTube has lots of exercise videos for different ages and abilities as well  -[facility] (a  variety of indoor and outdoor inperson activities for adults). [phone]. [address]  -Virtual Online Classes (a variety of topics): see seniorplanet.org or call (531) [phone]  -consider volunteering at a school, hospice center, church, senior center or elsewhere

## 2024-04-05 NOTE — Progress Notes (Signed)
 PATIENT CHECK-IN and HEALTH RISK ASSESSMENT QUESTIONNAIRE:  -completed by phone/video for upcoming Medicare Preventive Visit  Pre-Visit Check-in: 1)Vitals (height, wt, BP, etc) - record in vitals section for visit on day of visit Request home vitals (wt, BP, etc.) and enter into vitals, THEN update Vital Signs SmartPhrase below at the top of the HPI. See below.  2)Review and Update Medications, Allergies PMH, Surgeries, Social history in Epic 3)Hospitalizations in the last year with date/reason? Yes,   4)Review and Update Care Team (patient's specialists) in Epic 5) Complete PHQ9 in Epic  6) Complete Fall Screening in Epic 7)Review all Health Maintenance Due and order if not done.  Medicare Wellness Patient Questionnaire:  Answer theses question about your habits: How often do you have a drink containing alcohol?NO  How many drinks containing alcohol do you have on a typical day when you are drinking?Na  How often do you have six or more drinks on one occasion?Na  Have you ever smoked?yes  Quit date if applicable? 12 years   How many packs a day do/did you smoke? 1 pack  Do you use smokeless tobacco?NO  Do you use an illicit drugs?No  On average, how many days per week do you engage in moderate to strenuous exercise (like a brisk walk)? Does some walking daily On average, how many minutes do you engage in exercise at this level? 10-15  Are you sexually active? No Number of partners?Na  Typical breakfast: buttered toast, fruit, egg  Typical lunch:Sandwich  Typical dinner:meat salad bread  Typical snacks: crackers, fruit   Beverages: water   Answer theses question about your everyday activities: Can you perform most household chores?Yes  Are you deaf or have significant trouble hearing?No  Do you feel that you have a problem with memory?yes  Do you feel safe at home?yes  Last dentist visit?dentures 8. Do you have any difficulty performing your everyday activities?no  Are you  having any difficulty walking, taking medications on your own, and or difficulty managing daily home needs?yes, walking  Do you have difficulty walking or climbing stairs?yes  Do you have difficulty dressing or bathing?No  Do you have difficulty doing errands alone such as visiting a doctor's office or shopping?Yes, patient doesn't drive  Do you currently have any difficulty preparing food and eating?no  Do you currently have any difficulty using the toilet?no  Do you have any difficulty managing your finances?no  Do you have any difficulties with housekeeping of managing your housekeeping?no    Do you have Advanced Directives in place (Living Will, Healthcare Power or Attorney)? Yes    Last eye Exam and location? Reports prior doctor retired, now will see a new office.    Do you currently use prescribed or non-prescribed narcotic or opioid pain medications?No   Do you have a history or close family history of breast, ovarian, tubal or peritoneal cancer or a family member with BRCA (breast cancer susceptibility 1 and 2) gene mutations?yes    Nurse/Assistant Credentials/time stamp:Leah A.Wright CMA 5:03pm     ----------------------------------------------------------------------------------------------------------------------------------------------------------------------------------------------------------------------  Because this visit was a virtual/telehealth visit, some criteria may be missing or patient reported. Any vitals not documented were not able to be obtained and vitals that have been documented are patient reported.    MEDICARE ANNUAL PREVENTIVE VISIT WITH PROVIDER: (Welcome to Medicare, initial annual wellness or annual wellness exam)  Virtual Visit via Video Note  I connected with Barbara Golden on 04/05/24 by a video enabled telemedicine application and verified that I  am speaking with the correct person using two identifiers.  Location patient: home Location  provider:work or home office Persons participating in the virtual visit: patient, provider  Concerns and/or follow up today: reports is doing well. Seeing Dr. Sw[place]il[place] tomorrow about a thumb joint issue - but otherwise is doing well.    See HM section in Epic for other details of completed HM.    ROS: negative for report of fevers, unintentional weight loss, vision changes, vision loss, hearing loss or change, chest pain, sob, hemoptysis, melena, hematochezia, hematuria, falls, bleeding or bruising, thoughts of suicide or self harm, memory loss  Patient-completed extensive health risk assessment - reviewed and discussed with the patient: See Health Risk Assessment completed with patient prior to the visit either above or in recent phone note. This was reviewed in detailed with the patient today and appropriate recommendations, orders and referrals were placed as needed per Summary below and patient instructions.   Review of Medical History: -PMH, PSH, Family History and current specialty and care providers reviewed and updated and listed below   Patient Care Team: [place], Betty G, MD as PCP - General (Family Medicine)   Past Medical History:  Diagnosis Date   Allergy    Cancer [facility])    endometrial   Cataract    Diabetes mellitus without complication (HCC)    type 2   GERD (gastroesophageal reflux disease)    High cholesterol    Jaundice    as a child   Macular degeneration    Nuclear sclerotic cataract of both eyes 09/05/2020   Varicose veins of lower extremity    right leg    Past Surgical History:  Procedure Laterality Date   ROBOTIC ASSISTED TOTAL HYSTERECTOMY WITH BILATERAL SALPINGO OOPHERECTOMY Bilateral 09/11/2020   Procedure: XI ROBOTIC ASSISTED TOTAL HYSTERECTOMY WITH BILATERAL SALPINGO OOPHORECTOMY;  Surgeon: Eloy Herring, MD;  Location: [facility];  Service: Gynecology;  Laterality: Bilateral;   SENTINEL NODE BIOPSY N/A 09/11/2020   Procedure: SENTINEL NODE BIOPSY;   Surgeon: Eloy Herring, MD;  Location: [facility];  Service: Gynecology;  Laterality: N/A;   TUBAL LIGATION Bilateral     Social History   Socioeconomic History   Marital status: Widowed    Spouse name: Not on file   Number of children: 1   Years of education: Not on file   Highest education level: Not on file  Occupational History   Occupation: retired  Tobacco Use   Smoking status: Former    Current packs/day: 0.00    Types: Cigarettes    Quit date: 03/26/2009    Years since quitting: 15.0   Smokeless tobacco: Never  Vaping Use   Vaping status: Never Used  Substance and Sexual Activity   Alcohol use: No   Drug use: No   Sexual activity: Not Currently  Other Topics Concern   Not on file  Social History Narrative   3 Step Daughters/lives with Daughter(Pam Bouwer) and son-in-law.   Social Drivers of Corporate investment banker Strain: Low Risk  (03/03/2023)   Overall Financial Resource Strain (CARDIA)    Difficulty of Paying Living Expenses: Not hard at all  Food Insecurity: No Food Insecurity (08/22/2023)   Hunger Vital Sign    Worried About Running Out of Food in the Last Year: Never true    Ran Out of Food in the Last Year: Never true  Transportation Needs: No Transportation Needs (08/22/2023)   PRAPARE - Administrator, Civil Service (Medical): No  Lack of Transportation (Non-Medical): No  Physical Activity: Insufficiently Active (03/03/2023)   Exercise Vital Sign    Days of Exercise per Week: 7 days    Minutes of Exercise per Session: 10 min  Stress: No Stress Concern Present (03/03/2023)   [employer] of Occupational Health - Occupational Stress Questionnaire    Feeling of Stress : Not at all  Social Connections: Socially Isolated (08/22/2023)   Social Connection and Isolation Panel    Frequency of Communication with Friends and Family: Never    Frequency of Social Gatherings with Friends and Family: Never    Attends Religious Services: Never    Automotive engineer or Organizations: No    Attends Banker Meetings: Never    Marital Status: Widowed  Intimate Partner Violence: Not At Risk (08/22/2023)   Humiliation, Afraid, Rape, and Kick questionnaire    Fear of Current or Ex-Partner: No    Emotionally Abused: No    Physically Abused: No    Sexually Abused: No    Family History  Problem Relation Age of Onset   Colon cancer Mother    Cancer Mother        colon/stomach   Heart disease Father    Heart attack Sister    Cancer Nephew        bile duct    Current Outpatient Medications on File Prior to Visit  Medication Sig Dispense Refill   albuterol  (VENTOLIN  HFA) 108 (90 Base) MCG/ACT inhaler TAKE 2 PUFFS BY MOUTH EVERY 6 HOURS AS NEEDED FOR WHEEZE OR SHORTNESS OF BREATH 8.5 each 2   Continuous Glucose Receiver (FREESTYLE LIBRE 3 READER) DEVI Use to check blood sugar 1 each 0   Continuous Glucose Sensor (FREESTYLE LIBRE 3 PLUS SENSOR) MISC CHANGE SENSOR EVERY 15 DAYS. 2 each 5   diclofenac  Sodium (VOLTAREN ) 1 % GEL Apply 2 g topically 4 (four) times daily. 150 g 1   Dulaglutide  (TRULICITY ) 1.5 MG/0.5ML SOAJ Inject 1.5 mg into the skin once a week. 6 mL 2   Elastic Bandages & Supports (MEDICAL COMPRESSION THIGH HIGH) MISC 1 each by Does not apply route daily. 2 each 0   Empagliflozin -metFORMIN  HCl (SYNJARDY ) 12.5-500 MG TABS Take 1 tablet by mouth 2 (two) times daily with a meal. 180 tablet 2   gabapentin  (NEURONTIN ) 100 MG capsule Take 1-2 capsules (100-200 mg total) by mouth at bedtime. 40 capsule 2   Insulin  Glargine (BASAGLAR  KWIKPEN) 100 UNIT/ML Inject 10 Units into the skin at bedtime. 6 mL 1   Multiple Vitamins-Minerals (PRESERVISION AREDS) CAPS Take 1 capsule by mouth daily.     pantoprazole  (PROTONIX ) 20 MG tablet Take 1 tablet (20 mg total) by mouth daily before breakfast. 90 tablet 2   rosuvastatin  (CRESTOR ) 5 MG tablet Take 5 mg by mouth daily.     azithromycin  (ZITHROMAX ) 250 MG tablet Take 1 tablet (250 mg  total) by mouth daily. Take 1 every day until finished. 4 tablet 0   [DISCONTINUED] cetirizine (ZYRTEC) 10 MG tablet Take 10 mg by mouth daily as needed for allergies.     No current facility-administered medications on file prior to visit.    Allergies  Allergen Reactions   Ammonia     Unknown rxn.   Codeine Other (See Comments)    Causes lip burning and hyperactivity       Physical Exam Vitals requested from patient and listed below if patient had equipment and was able to obtain at home for this  virtual visit: There were no vitals filed for this visit. Estimated body mass index is 26.15 kg/m as calculated from the following:   Height as of 03/16/24: 5' 5 (1.651 m).   Weight as of 03/16/24: 157 lb 2 oz (71.3 kg).  EKG (optional): deferred due to virtual visit  GENERAL: alert, oriented, no acute distress detected, full vision exam deferred due to pandemic and/or virtual encounter  HEENT: atraumatic, conjunttiva clear, no obvious abnormalities on inspection of external nose and ears  NECK: normal movements of the head and neck  LUNGS: on inspection no signs of respiratory distress, breathing rate appears normal, no obvious gross SOB, gasping or wheezing  CV: no obvious cyanosis  MS: moves all visible extremities without noticeable abnormality  PSYCH/NEURO: pleasant and cooperative, no obvious depression or anxiety, speech and thought processing grossly intact, Cognitive function grossly intact  Flowsheet Row Video Visit from 03/03/2023 in [facility]  PHQ-9 Total Score 1        04/05/2024    4:55 PM 03/16/2024   10:28 AM 03/03/2023   11:09 AM 10/31/2021    1:58 PM 04/16/2020    3:07 PM  Depression screen PHQ 2/9  Decreased Interest 0 0 0 0 0  Down, Depressed, Hopeless 0 0 0 0 0  PHQ - 2 Score 0 0 0 0 0  Altered sleeping   0    Tired, decreased energy   1    Change in appetite   0    Feeling bad or failure about yourself    0     Trouble concentrating   0    Moving slowly or fidgety/restless   0    Suicidal thoughts   0    PHQ-9 Score   1         07/08/2021    3:01 PM 10/31/2021    2:00 PM 03/06/2022    9:56 PM 03/03/2023   11:09 AM 04/05/2024    4:54 PM  Fall Risk  Falls in the past year? 0 0  1 0  Was there an injury with Fall? 0 0  0 0  Fall Risk Category Calculator 0 0  1 0  Fall Risk Category (Retired) Low  Low      (RETIRED) Patient Fall Risk Level Low fall risk  Low fall risk  Low fall risk     Patient at Risk for Falls Due to No Fall Risks No Fall Risks     Fall risk Follow up Falls evaluation completed  Falls evaluation completed   Falls evaluation completed Falls evaluation completed     Data saved with a previous flowsheet row definition     SUMMARY AND PLAN:  Encounter for Medicare annual wellness exam   Discussed applicable health maintenance/preventive health measures and advised and referred or ordered per patient preferences: -she plans to get the vaccines at the pharmacy -she declined the dexa  -she says she will schedule her eye exam Health Maintenance  Topic Date Due   Pneumococcal Vaccine: 50+ Years (1 of 2 - PCV) Never done   DEXA SCAN  Never done   COVID-19 Vaccine (3 - Moderna risk series) 09/29/2019   OPHTHALMOLOGY EXAM  06/26/2023   Influenza Vaccine  02/19/2024   Zoster Vaccines- Shingrix (1 of 2) 07/29/2024 (Originally 03/21/1955)   HEMOGLOBIN A1C  07/12/2024   FOOT EXAM  03/16/2025   Medicare Annual Wellness (AWV)  04/05/2025   DTaP/Tdap/Td (2 - Td or Tdap)  10/04/2028   HPV VACCINES  Aged Out   Meningococcal B Vaccine  Aged [employer] and counseling on the following was provided based on the above review of health and a plan/checklist for the patient, along with additional information discussed, was provided for the patient in the patient instructions :  -Reviewed and demonstrated safe balance exercises that can be done at home to improve balance and  discussed exercise guidelines for adults with include balance exercises at least 3 days per week.  -Advised and counseled on a healthy lifestyle - including the importance of a healthy diet, regular physical activit -Reviewed patient's current diet. Advised and counseled on a whole foods based healthy diet. A summary of a healthy diet was provided in the Patient Instructions.  -reviewed patient's current physical activity level and discussed exercise guidelines for adults. Discussed community resources and ideas for safe exercise at home to assist in meeting exercise guideline recommendations in a safe and healthy way.  -Advise yearly dental visits at minimum and regular eye exams   Follow up: see patient instructions     Patient Instructions  I really enjoyed getting to talk with you today! I am available on Tuesdays and Thursdays for virtual visits if you have any questions or concerns, or if I can be of any further assistance.   CHECKLIST FROM ANNUAL WELLNESS VISIT:  -Follow up (please call to schedule if not scheduled after visit):   -yearly for annual wellness visit with primary care office  Here is a list of your preventive care/health maintenance measures and the plan for each if any are due:  PLAN For any measures below that may be due:    1. Can get vaccines at the pharmacy, please let us  know if you do so that we can update your records.   2. Please schedule your eye exam.   Health Maintenance  Topic Date Due   Pneumococcal Vaccine: 50+ Years (1 of 2 - PCV) Never done   DEXA SCAN  Never done   COVID-19 Vaccine (3 - Moderna risk series) 09/29/2019   OPHTHALMOLOGY EXAM  06/26/2023   Influenza Vaccine  02/19/2024   Zoster Vaccines- Shingrix (1 of 2) 07/29/2024 (Originally 03/21/1955)   HEMOGLOBIN A1C  07/12/2024   FOOT EXAM  03/16/2025   Medicare Annual Wellness (AWV)  04/05/2025   DTaP/Tdap/Td (2 - Td or Tdap) 10/04/2028   HPV VACCINES  Aged Out   Meningococcal B  Vaccine  Aged Out    -See a dentist at least yearly  -Get your eyes checked and then per your eye specialist's recommendations  -Other issues addressed today:   -I have included below further information regarding a healthy whole foods based diet, physical activity guidelines for adults, stress management and opportunities for social connections. I hope you find this information useful.   -----------------------------------------------------------------------------------------------------------------------------------------------------------------------------------------------------------------------------------------------------------    NUTRITION: -eat real food: lots of colorful vegetables (half the plate) and fruits -5-7 servings of vegetables and fruits per day (fresh or steamed is best), exp. 2 servings of vegetables with lunch and dinner and 2 servings of fruit per day. Berries and greens such as kale and collards are great choices.  -consume on a regular basis:  fresh fruits, fresh veggies, fish, nuts, seeds, healthy oils (such as olive oil, avocado oil), whole grains (make sure for bread/pasta/crackers/etc., that the first ingredient on label contains the word whole), legumes. -can eat small amounts of dairy and lean meat (no larger than the  palm of your hand), but avoid processed meats such as ham, bacon, lunch meat, etc. -drink water  -try to avoid fast food and pre-packaged foods, processed meat, ultra processed foods/beverages (donuts, candy, etc.) -most experts advise limiting sodium to < 2300mg  per day, should limit further is any chronic conditions such as high blood pressure, heart disease, diabetes, etc. The American Heart Association advised that < 1500mg  is is ideal -try to avoid foods/beverages that contain any ingredients with names you do not recognize  -try to avoid foods/beverages  with added sugar or sweeteners/sweets  -try to avoid sweet drinks (including diet  drinks): soda, juice, Gatorade, sweet tea, power drinks, diet drinks -try to avoid white rice, white bread, pasta (unless whole grain)  EXERCISE GUIDELINES FOR ADULTS: -if you wish to increase your physical activity, do so gradually and with the approval of your doctor -STOP and seek medical care immediately if you have any chest pain, chest discomfort or trouble breathing when starting or increasing exercise  -move and stretch your body, legs, feet and arms when sitting for long periods -Physical activity guidelines for optimal health in adults: -get at least 150 minutes per week of moderate exercise (can talk, but not sing); this is about 20-30 minutes of sustained activity 5-7 days per week or two 10-15 minute episodes of sustained activity 5-7 days per week -do some muscle building/resistance training/strength training at least 2 days per week  -balance exercises 3+ days per week:   Stand somewhere where you have something sturdy to hold onto if you lose balance    1) lift up on toes, then back down, start with 5x per day and work up to 20x   2) stand and lift one leg straight out to the side so that foot is a few inches of the floor, start with 5x each side and work up to 20x each side   3) stand on one foot, start with 5 seconds each side and work up to 20 seconds on each side  If you need ideas or help with getting more active:  -Silver sneakers https://tools.silversneakers.com  -Walk with a Doc: http://www.duncan-williams.com/  -try to include resistance (weight lifting/strength building) and balance exercises twice per week: or the following link for ideas: http://castillo-powell.com/  BuyDucts.dk  STRESS MANAGEMENT: -can try meditating, or just sitting quietly with deep breathing while intentionally relaxing all parts of your body for 5 minutes daily -if you need further help with stress,  anxiety or depression please follow up with your primary doctor or contact the wonderful folks at [facility]: [phone]  SOCIAL CONNECTIONS: -options in [place] if you wish to engage in more social and exercise related activities:  -Silver sneakers https://tools.silversneakers.com  -Walk with a Doc: http://www.duncan-williams.com/  -Check out the [facility] Active Adults 50+ section on the [place] of [employer] (hiking clubs, book clubs, cards and games, chess, exercise classes, aquatic classes and much more) - see the website for details: https://www.[place].gov/departments/parks-recreation/active-adults50  -YouTube has lots of exercise videos for different ages and abilities as well  -[facility] (a variety of indoor and outdoor inperson activities for adults). [phone]. [address]  -Virtual Online Classes (a variety of topics): see seniorplanet.org or call [phone]  -consider volunteering at a school, hospice center, church, senior center or elsewhere            Barbara JONELLE Cramp, DO

## 2024-04-06 ENCOUNTER — Ambulatory Visit: Admitting: Family Medicine

## 2024-04-29 DIAGNOSIS — Z008 Encounter for other general examination: Secondary | ICD-10-CM | POA: Diagnosis not present

## 2024-05-26 ENCOUNTER — Encounter: Payer: Self-pay | Admitting: Adult Health

## 2024-05-26 ENCOUNTER — Ambulatory Visit (INDEPENDENT_AMBULATORY_CARE_PROVIDER_SITE_OTHER): Admitting: Adult Health

## 2024-05-26 VITALS — BP 138/62 | HR 91 | Temp 97.8°F | Ht 65.0 in | Wt 155.0 lb

## 2024-05-26 DIAGNOSIS — R051 Acute cough: Secondary | ICD-10-CM | POA: Diagnosis not present

## 2024-05-26 DIAGNOSIS — J0110 Acute frontal sinusitis, unspecified: Secondary | ICD-10-CM | POA: Diagnosis not present

## 2024-05-26 MED ORDER — DOXYCYCLINE HYCLATE 100 MG PO CAPS: 100.0000 mg | ORAL_CAPSULE | Freq: Two times a day (BID) | ORAL | 0 refills | Status: AC

## 2024-05-26 NOTE — Progress Notes (Signed)
 Subjective:    Patient ID: Barbara Golden, female    DOB: [birth year], 88 y.o.   MRN: [id number]  HPI  Discussed the use of AI scribe software for clinical note transcription with the patient, who gave verbal consent to proceed.  History of Present Illness   Barbara Golden is an 88 year old female who presents with a persistent cough and sinus congestion.  She has had a persistent cough for the past week to week and a half, worsening at night when lying down and improving when sitting up. She discontinued Mucinex DM a few days ago due to lack of improvement. There is no fever or chills.  Sinus pressure is present, particularly across her eyes, and she experienced a headache the other night. She has a runny nose, especially in the morning, which usually resolves with allergy medication, but today it has persisted.  She lives with her daughter and son-in-law, both of whom have similar symptoms. Her grandson's girlfriend also had similar symptoms and was seen in the office recently. She does not smoke but lives with someone that smokes.       Review of Systems See HPI   Past Medical History:  Diagnosis Date   Allergy    Cancer (HCC)    endometrial   Cataract    Diabetes mellitus without complication (HCC)    type 2   GERD (gastroesophageal reflux disease)    High cholesterol    Jaundice    as a child   Macular degeneration    Nuclear sclerotic cataract of both eyes 09/05/2020   Varicose veins of lower extremity    right leg    Social History   Socioeconomic History   Marital status: Widowed    Spouse name: Not on file   Number of children: 1   Years of education: Not on file   Highest education level: Not on file  Occupational History   Occupation: retired  Tobacco Use   Smoking status: Former    Current packs/day: 0.00    Types: Cigarettes    Quit date: 03/26/2009    Years since quitting: 15.1   Smokeless tobacco: Never  Vaping Use   Vaping status: Never Used   Substance and Sexual Activity   Alcohol use: No   Drug use: No   Sexual activity: Not Currently  Other Topics Concern   Not on file  Social History Narrative   3 Step Daughters/lives with Daughter(Pam Bouwer) and son-in-law.   Social Drivers of Corporate Investment Banker Strain: Low Risk  (03/03/2023)   Overall Financial Resource Strain (CARDIA)    Difficulty of Paying Living Expenses: Not hard at all  Food Insecurity: No Food Insecurity (08/22/2023)   Hunger Vital Sign    Worried About Running Out of Food in the Last Year: Never true    Ran Out of Food in the Last Year: Never true  Transportation Needs: No Transportation Needs (08/22/2023)   PRAPARE - Administrator, Civil Service (Medical): No    Lack of Transportation (Non-Medical): No  Physical Activity: Insufficiently Active (03/03/2023)   Exercise Vital Sign    Days of Exercise per Week: 7 days    Minutes of Exercise per Session: 10 min  Stress: No Stress Concern Present (03/03/2023)   [employer] of Occupational Health - Occupational Stress Questionnaire    Feeling of Stress : Not at all  Social Connections: Socially Isolated (08/22/2023)   Social Connection and Isolation  Panel    Frequency of Communication with Friends and Family: Never    Frequency of Social Gatherings with Friends and Family: Never    Attends Religious Services: Never    Database Administrator or Organizations: No    Attends Banker Meetings: Never    Marital Status: Widowed  Intimate Partner Violence: Not At Risk (08/22/2023)   Humiliation, Afraid, Rape, and Kick questionnaire    Fear of Current or Ex-Partner: No    Emotionally Abused: No    Physically Abused: No    Sexually Abused: No    Past Surgical History:  Procedure Laterality Date   ROBOTIC ASSISTED TOTAL HYSTERECTOMY WITH BILATERAL SALPINGO OOPHERECTOMY Bilateral 09/11/2020   Procedure: XI ROBOTIC ASSISTED TOTAL HYSTERECTOMY WITH BILATERAL SALPINGO OOPHORECTOMY;   Surgeon: Eloy Herring, MD;  Location: [facility];  Service: Gynecology;  Laterality: Bilateral;   SENTINEL NODE BIOPSY N/A 09/11/2020   Procedure: SENTINEL NODE BIOPSY;  Surgeon: Eloy Herring, MD;  Location: [facility];  Service: Gynecology;  Laterality: N/A;   TUBAL LIGATION Bilateral     Family History  Problem Relation Age of Onset   Colon cancer Mother    Cancer Mother        colon/stomach   Heart disease Father    Heart attack Sister    Cancer Nephew        bile duct    Allergies  Allergen Reactions   Ammonia     Unknown rxn.   Codeine Other (See Comments)    Causes lip burning and hyperactivity    Current Outpatient Medications on File Prior to Visit  Medication Sig Dispense Refill   albuterol  (VENTOLIN  HFA) 108 (90 Base) MCG/ACT inhaler TAKE 2 PUFFS BY MOUTH EVERY 6 HOURS AS NEEDED FOR WHEEZE OR SHORTNESS OF BREATH 8.5 each 2   azithromycin  (ZITHROMAX ) 250 MG tablet Take 1 tablet (250 mg total) by mouth daily. Take 1 every day until finished. 4 tablet 0   Continuous Glucose Receiver (FREESTYLE LIBRE 3 READER) DEVI Use to check blood sugar 1 each 0   Continuous Glucose Sensor (FREESTYLE LIBRE 3 PLUS SENSOR) MISC CHANGE SENSOR EVERY 15 DAYS. 2 each 5   diclofenac  Sodium (VOLTAREN ) 1 % GEL Apply 2 g topically 4 (four) times daily. 150 g 1   Dulaglutide  (TRULICITY ) 1.5 MG/0.5ML SOAJ Inject 1.5 mg into the skin once a week. 6 mL 2   Elastic Bandages & Supports (MEDICAL COMPRESSION THIGH HIGH) MISC 1 each by Does not apply route daily. 2 each 0   Empagliflozin -metFORMIN  HCl (SYNJARDY ) 12.5-500 MG TABS Take 1 tablet by mouth 2 (two) times daily with a meal. 180 tablet 2   gabapentin  (NEURONTIN ) 100 MG capsule Take 1-2 capsules (100-200 mg total) by mouth at bedtime. 40 capsule 2   Insulin  Glargine (BASAGLAR  KWIKPEN) 100 UNIT/ML Inject 10 Units into the skin at bedtime. 6 mL 1   Multiple Vitamins-Minerals (PRESERVISION AREDS) CAPS Take 1 capsule by mouth daily.     pantoprazole  (PROTONIX )  20 MG tablet Take 1 tablet (20 mg total) by mouth daily before breakfast. 90 tablet 2   rosuvastatin  (CRESTOR ) 5 MG tablet Take 5 mg by mouth daily.     [DISCONTINUED] cetirizine (ZYRTEC) 10 MG tablet Take 10 mg by mouth daily as needed for allergies.     No current facility-administered medications on file prior to visit.    BP 138/62   Pulse 91   Temp 97.8 F (36.6 C) (Oral)   Ht 5'  5 (1.651 m)   Wt 155 lb (70.3 kg)   LMP 07/21/1998 (Approximate)   SpO2 97%   BMI 25.79 kg/m       Objective:   Physical Exam Vitals and nursing note reviewed.  Constitutional:      Appearance: Normal appearance.  HENT:     Nose: Congestion and rhinorrhea present. Rhinorrhea is purulent.     Right Turbinates: Enlarged and swollen.     Left Turbinates: Enlarged and swollen.     Right Sinus: Maxillary sinus tenderness present. No frontal sinus tenderness.     Left Sinus: Maxillary sinus tenderness present. No frontal sinus tenderness.     Mouth/Throat:     Dentition: Gingival swelling present.     Pharynx: Oropharynx is clear. Uvula midline. Postnasal drip present. No oropharyngeal exudate.  Cardiovascular:     Rate and Rhythm: Normal rate and regular rhythm.     Pulses: Normal pulses.     Heart sounds: Normal heart sounds.  Pulmonary:     Effort: Pulmonary effort is normal.     Breath sounds: Normal breath sounds.  Skin:    General: Skin is warm and dry.  Neurological:     General: No focal deficit present.     Mental Status: She is alert and oriented to person, place, and time.  Psychiatric:        Mood and Affect: Mood normal.        Behavior: Behavior normal.        Thought Content: Thought content normal.        Judgment: Judgment normal.        Assessment & Plan:  1. Acute non-recurrent frontal sinusitis (Primary) - Will treat for suspected mild sinusitis.  - Follow up if not improving once in the next week or sooner if fever develops   doxycycline  (VIBRAMYCIN ) 100 MG  capsule; Take 1 capsule (100 mg total) by mouth 2 (two) times daily.  Dispense: 14 capsule; Refill: 0  2. Acute cough - Should resolve once sinusitis resolved   Darleene Shape, NP

## 2024-06-21 ENCOUNTER — Ambulatory Visit: Admitting: Podiatry

## 2024-06-21 DIAGNOSIS — M79671 Pain in right foot: Secondary | ICD-10-CM

## 2024-06-21 DIAGNOSIS — M79672 Pain in left foot: Secondary | ICD-10-CM | POA: Diagnosis not present

## 2024-06-21 DIAGNOSIS — B351 Tinea unguium: Secondary | ICD-10-CM | POA: Diagnosis not present

## 2024-06-21 NOTE — Progress Notes (Signed)
 Patient presents for evaluation and treatment of tenderness and some redness around nails feet.  Tenderness around toes with walking and wearing shoes.  Physical exam:  General appearance: Alert, pleasant, and in no acute distress.  Vascular: Pedal pulses: DP 2/4 B/L, PT 1/4 B/L. Mild edema lower legs bilaterally.  Capillary refill time immediate bilaterally  Neurologic:  Dermatologic:  Nails thickened, disfigured, discolored 1-5 BL with subungual debris.  Redness and hypertrophic nail folds along nail folds bilaterally but no signs of drainage or infection.  Musculoskeletal:     Diagnosis: 1. Painful onychomycotic nails 1 through 5 bilaterally. 2. Pain toes 1 through 5 bilaterally.  Plan: -Debrided onychomycotic nails 1 through 5 bilaterally.  Sharply debrided nails with nail clipper and reduced with a power bur.  Return 3 months RFC

## 2024-07-02 ENCOUNTER — Ambulatory Visit
Admission: EM | Admit: 2024-07-02 | Discharge: 2024-07-02 | Disposition: A | Discharge status: Home / Self Care | Attending: Family Medicine | Admitting: Family Medicine

## 2024-07-02 ENCOUNTER — Other Ambulatory Visit: Payer: Self-pay

## 2024-07-02 DIAGNOSIS — Z20828 Contact with and (suspected) exposure to other viral communicable diseases: Secondary | ICD-10-CM | POA: Diagnosis not present

## 2024-07-02 LAB — POCT INFLUENZA A/B
Influenza A, POC: NEGATIVE
Influenza B, POC: NEGATIVE

## 2024-07-02 MED ORDER — OSELTAMIVIR PHOSPHATE 75 MG PO CAPS: 75.0000 mg | ORAL_CAPSULE | Freq: Two times a day (BID) | ORAL | 0 refills | Status: AC

## 2024-07-02 NOTE — ED Provider Notes (Signed)
 [facility]    CSN: [id number] Arrival date & time: 07/02/24  1132      History   Chief Complaint Chief Complaint  Patient presents with   Cough    HPI Barbara Golden is a 88 y.o. female.   HPI Pleasant 88 year old female presents to clinic with fever, chills and generalized bodyaches for 1 day.  Patient reports exposed to influenza from her son-in-law yesterday.  Son-in-law evaluated and treated by me for influenza. PMH significant for endometrial cancer, T2DM without complication, and macular degeneration.  Past Medical History:  Diagnosis Date   Allergy    Cancer (HCC)    endometrial   Cataract    Diabetes mellitus without complication (HCC)    type 2   GERD (gastroesophageal reflux disease)    High cholesterol    Jaundice    as a child   Macular degeneration    Nuclear sclerotic cataract of both eyes 09/05/2020   Varicose veins of lower extremity    right leg    Patient Active Problem List   Diagnosis Date Noted   Gastroesophageal reflux disease 01/11/2024   Varicose veins of both lower extremities 10/09/2023   Vitamin D  deficiency, unspecified 09/22/2023   Atherosclerosis of superior mesenteric artery 08/26/2023   Syncope 08/22/2023   Polyneuropathy associated with underlying disease 07/28/2023   COVID-19 04/14/2023   Sepsis (HCC) 04/14/2023   Generalized weakness 04/14/2023   Allergic rhinitis 04/14/2023   Hyperlipidemia 04/15/2022   Pseudophakia, both eyes 03/21/2021   Endometrial cancer (HCC) 09/11/2020   Posterior vitreous detachment of both eyes 09/05/2020   Advanced nonexudative age-related macular degeneration of right eye with subfoveal involvement 09/05/2020   Advanced nonexudative age-related macular degeneration of left eye without subfoveal involvement 09/05/2020   Overgrown toenails 12/15/2019   Lumbar paraspinal muscle spasm 10/06/2019   Stress incontinence 10/06/2019   Type 2 diabetes mellitus with diabetic neuropathy,  unspecified (HCC) 10/06/2019   DM2 (diabetes mellitus, type 2) (HCC) 09/21/2019   Essential hypertension 09/21/2019   Asymptomatic bacteriuria 09/21/2019    Past Surgical History:  Procedure Laterality Date   ROBOTIC ASSISTED TOTAL HYSTERECTOMY WITH BILATERAL SALPINGO OOPHERECTOMY Bilateral 09/11/2020   Procedure: XI ROBOTIC ASSISTED TOTAL HYSTERECTOMY WITH BILATERAL SALPINGO OOPHORECTOMY;  Surgeon: Eloy Herring, MD;  Location: [facility];  Service: Gynecology;  Laterality: Bilateral;   SENTINEL NODE BIOPSY N/A 09/11/2020   Procedure: SENTINEL NODE BIOPSY;  Surgeon: Eloy Herring, MD;  Location: [facility];  Service: Gynecology;  Laterality: N/A;   TUBAL LIGATION Bilateral     OB History     Gravida  1   Para  1   Term      Preterm      AB      Living  1      SAB      IAB      Ectopic      Multiple      Live Births               Home Medications    Prior to Admission medications  Medication Sig Start Date End Date Taking? Authorizing Provider  oseltamivir  (TAMIFLU ) 75 MG capsule Take 1 capsule (75 mg total) by mouth every 12 (twelve) hours. 07/02/24  Yes Teddy Sharper, FNP  albuterol  (VENTOLIN  HFA) 108 (90 Base) MCG/ACT inhaler TAKE 2 PUFFS BY MOUTH EVERY 6 HOURS AS NEEDED FOR WHEEZE OR SHORTNESS OF BREATH 04/04/24   Jordan, Betty G, MD  Continuous Glucose Receiver (FREESTYLE [place]  3 READER) DEVI Use to check blood sugar 09/14/23   Jordan, Betty G, MD  Continuous Glucose Sensor (FREESTYLE LIBRE 3 PLUS SENSOR) MISC CHANGE SENSOR EVERY 15 DAYS. 03/22/24   Jordan, Betty G, MD  diclofenac  Sodium (VOLTAREN ) 1 % GEL Apply 2 g topically 4 (four) times daily. 03/16/24   Jordan, Betty G, MD  doxycycline  (VIBRAMYCIN ) 100 MG capsule Take 1 capsule (100 mg total) by mouth 2 (two) times daily. 05/26/24   Nafziger, Darleene, NP  Dulaglutide  (TRULICITY ) 1.5 MG/0.5ML SOAJ Inject 1.5 mg into the skin once a week. 11/10/23   Jordan, Betty G, MD  Elastic Bandages & Supports (MEDICAL COMPRESSION THIGH  HIGH) MISC 1 each by Does not apply route daily. 04/12/20   Wieters, Hallie C, PA-C  Empagliflozin -metFORMIN  HCl (SYNJARDY ) 12.5-500 MG TABS Take 1 tablet by mouth 2 (two) times daily with a meal. 10/09/23   Jordan, Betty G, MD  gabapentin  (NEURONTIN ) 100 MG capsule Take 1-2 capsules (100-200 mg total) by mouth at bedtime. 01/11/24   Jordan, Betty G, MD  Insulin  Glargine (BASAGLAR  [facility]) 100 UNIT/ML Inject 10 Units into the skin at bedtime. 03/16/24   Jordan, Betty G, MD  Multiple Vitamins-Minerals (PRESERVISION AREDS) CAPS Take 1 capsule by mouth daily. 08/21/20   [provider]  pantoprazole  (PROTONIX ) 20 MG tablet Take 1 tablet (20 mg total) by mouth daily before breakfast. 01/11/24   Jordan, Betty G, MD  rosuvastatin  (CRESTOR ) 5 MG tablet Take 5 mg by mouth daily. 08/17/20   [provider]  cetirizine (ZYRTEC) 10 MG tablet Take 10 mg by mouth daily as needed for allergies.  09/13/19  [provider]    Family History Family History  Problem Relation Age of Onset   Colon cancer Mother    Cancer Mother        colon/stomach   Heart disease Father    Heart attack Sister    Cancer Nephew        bile duct    Social History Social History[1]   Allergies   Ammonia and Codeine   Review of Systems Review of Systems  Constitutional:  Positive for chills and fever.  Musculoskeletal:  Positive for arthralgias and myalgias.  All other systems reviewed and are negative.    Physical Exam Triage Vital Signs ED Triage Vitals [07/02/24 1244]  Encounter Vitals Group     BP (!) 147/73     Girls Systolic BP Percentile      Girls Diastolic BP Percentile      Boys Systolic BP Percentile      Boys Diastolic BP Percentile      Pulse Rate 79     Resp 18     Temp 97.6 F (36.4 C)     Temp Source Oral     SpO2 92 %     Weight 162 lb (73.5 kg)     Height 5' 5 (1.651 m)     Head Circumference      Peak Flow      Pain Score      Pain Loc      Pain Education       Exclude from Growth Chart    No data found.  Updated Vital Signs BP (!) 147/73 (BP Location: Right Arm)   Pulse 79   Temp 97.6 F (36.4 C) (Oral)   Resp 18   Ht 5' 5 (1.651 m)   Wt 162 lb (73.5 kg)   LMP 07/21/1998   SpO2 92%  BMI 26.96 kg/m    Physical Exam Vitals and nursing note reviewed.  Constitutional:      Appearance: Normal appearance. She is normal weight.  HENT:     Head: Normocephalic and atraumatic.     Right Ear: Tympanic membrane, ear canal and external ear normal.     Left Ear: Tympanic membrane, ear canal and external ear normal.     Mouth/Throat:     Mouth: Mucous membranes are moist.     Pharynx: Oropharynx is clear.  Eyes:     Extraocular Movements: Extraocular movements intact.     Conjunctiva/sclera: Conjunctivae normal.     Pupils: Pupils are equal, round, and reactive to light.  Cardiovascular:     Rate and Rhythm: Normal rate and regular rhythm.     Heart sounds: Normal heart sounds.  Pulmonary:     Effort: Pulmonary effort is normal.     Breath sounds: Normal breath sounds. No wheezing, rhonchi or rales.  Musculoskeletal:        General: Normal range of motion.  Skin:    General: Skin is warm and dry.  Neurological:     General: No focal deficit present.     Mental Status: She is alert and oriented to person, place, and time. Mental status is at baseline.  Psychiatric:        Mood and Affect: Mood normal.        Behavior: Behavior normal.      UC Treatments / Results  Labs (all labs ordered are listed, but only abnormal results are displayed) Labs Reviewed  POCT INFLUENZA A/B    EKG   Radiology No results found.  Procedures Procedures (including critical care time)  Medications Ordered in UC Medications - No data to display  Initial Impression / Assessment and Plan / UC Course  I have reviewed the triage vital signs and the nursing notes.  Pertinent labs & imaging results that were available during my care of the  patient were reviewed by me and considered in my medical decision making (see chart for details).    MDM: 1.  Exposure to influenza-Rx'd Tamiflu  75 mg capsule: Take 1 capsule twice daily x 7 days. Advised patient take medication as directed with food to completion.  Encouraged increase daily water  intake to 64 ounces per day while taking this medication.  Advised if symptoms worsen and/or unresolved please follow-up with your PCP or here for further evaluation.  Patient discharged home, hemodynamically stable. Final Clinical Impressions(s) / UC Diagnoses   Final diagnoses:  Exposure to influenza     Discharge Instructions      Advised patient take medication as directed with food to completion.  Encouraged increase daily water  intake to 64 ounces per day while taking this medication.  Advised if symptoms worsen and/or unresolved please follow-up with your PCP or here for further evaluation.     ED Prescriptions     Medication Sig Dispense Auth. Provider   oseltamivir  (TAMIFLU ) 75 MG capsule Take 1 capsule (75 mg total) by mouth every 12 (twelve) hours. 10 capsule Judson Tsan, FNP      PDMP not reviewed this encounter.    [1]  Social History Tobacco Use   Smoking status: Former    Current packs/day: 0.00    Average packs/day: 0.5 packs/day    Types: Cigarettes    Quit date: 03/26/2009    Years since quitting: 15.2   Smokeless tobacco: Never  Vaping Use   Vaping status: Never Used  Substance Use Topics   Alcohol use: No   Drug use: No     Teddy Sharper, FNP 07/02/24 1352

## 2024-07-02 NOTE — Discharge Instructions (Addendum)
 Advised patient take medication as directed with food to completion.  Encouraged increase daily water intake to 64 ounces per day while taking this medication.  Advised if symptoms worsen and/or unresolved please follow-up with your PCP or here for further evaluation.

## 2024-07-02 NOTE — ED Triage Notes (Signed)
 Pt presents with a chief complaint of cough x 1 week. This is accompanied with sore throat, nasal congestion, and headaches. The following symptoms started this morning. Family member in same household tested positive for Flu A yesterday. No pain or fevers. No OTC medications taken for symptoms reported.

## 2024-07-02 NOTE — ED Notes (Signed)
 Pt only requesting flu test at this time. NP made aware.

## 2024-07-12 ENCOUNTER — Other Ambulatory Visit: Payer: Self-pay | Admitting: Family Medicine

## 2024-07-12 MED ORDER — MEDICAL COMPRESSION THIGH HIGH MISC: 1.0000 | Freq: Every day | 0 refills | Status: AC

## 2024-07-12 NOTE — Telephone Encounter (Signed)
 Copied from CRM [id number]. Topic: Clinical - Medication Refill >> Jul 12, 2024 11:53 AM Tysheama G wrote: Medication: Elastic Bandages & Supports (MEDICAL COMPRESSION THIGH HIGH) MISC  Has the patient contacted their pharmacy? Yes (Agent: If no, request that the patient contact the pharmacy for the refill. If patient does not wish to contact the pharmacy document the reason why and proceed with request.) (Agent: If yes, when and what did the pharmacy advise?)  This is the patient's preferred pharmacy:  [id number] [address], [place] - [address] [place] [address] Phone: [phone] Fax: [phone]  Is this the correct pharmacy for this prescription? Yes If no, delete pharmacy and type the correct one.   Has the prescription been filled recently? No  Is the patient out of the medication? Yes  Has the patient been seen for an appointment in the last year OR does the patient have an upcoming appointment? Yes  Can we respond through MyChart? Yes  Agent: Please be advised that Rx refills may take up to 3 business days. We ask that you follow-up with your pharmacy.

## 2024-07-13 NOTE — Telephone Encounter (Signed)
 Patients daughter voiced understanding and did not have any questions or concerns.

## 2024-07-13 NOTE — Telephone Encounter (Signed)
 Patients daughter was informed of Dr. Gib message. Patients daughter wanted to talk to her mom first before scheduling appointment with Dr. Jordan or placing a referral for a Cardiologist.

## 2024-07-16 ENCOUNTER — Other Ambulatory Visit: Payer: Self-pay | Admitting: Family Medicine

## 2024-07-16 DIAGNOSIS — G63 Polyneuropathy in diseases classified elsewhere: Secondary | ICD-10-CM

## 2024-08-08 ENCOUNTER — Telehealth: Payer: Self-pay

## 2024-08-08 ENCOUNTER — Other Ambulatory Visit (HOSPITAL_COMMUNITY): Payer: Self-pay

## 2024-08-08 NOTE — Telephone Encounter (Signed)
 Pharmacy Patient Advocate Encounter   Received notification from [facility] KEY that prior authorization for Synjardy  12.5-500 is required/requested.   Insurance verification completed.   The patient is insured through [facility].   Per test claim: PA required; PA submitted to above mentioned insurance via Latent Key/confirmation #/EOC [id number] Status is pending

## 2024-08-16 NOTE — Telephone Encounter (Signed)
 Pharmacy Patient Advocate Encounter  Received notification from [facility] that Prior Authorization for Synjardy  12.5-500 has been DENIED.  Full denial letter will be uploaded to the media tab. See denial reason below.   PA #/Case ID/Reference #: [id number]

## 2024-08-24 ENCOUNTER — Other Ambulatory Visit (HOSPITAL_COMMUNITY): Payer: Self-pay

## 2024-08-24 NOTE — Telephone Encounter (Signed)
 Clinical questions have been answered and PA submitted. PA currently Pending. Please be advised that most companies allow up to 30 days to make a decision. We will advise when a determination has been made, or follow up in 1 week.   Please reach out to our team, Rx Prior Auth Pool, if you haven't heard back in a week.   Faxed to [phone] case# [id number]

## 2024-09-23 ENCOUNTER — Ambulatory Visit: Admitting: Podiatry
